# Patient Record
Sex: Female | Born: 1970 | Race: Black or African American | Hispanic: No | Marital: Single | Smoking: Current some day smoker
Health system: Southern US, Community
[De-identification: ages and names within clinical notes are randomized; demographics above are authoritative.]

## PROBLEM LIST (undated history)

## (undated) DIAGNOSIS — J45909 Unspecified asthma, uncomplicated: Secondary | ICD-10-CM

## (undated) DIAGNOSIS — F419 Anxiety disorder, unspecified: Secondary | ICD-10-CM

## (undated) DIAGNOSIS — E119 Type 2 diabetes mellitus without complications: Secondary | ICD-10-CM

## (undated) DIAGNOSIS — K5792 Diverticulitis of intestine, part unspecified, without perforation or abscess without bleeding: Secondary | ICD-10-CM

## (undated) DIAGNOSIS — E113499 Type 2 diabetes mellitus with severe nonproliferative diabetic retinopathy without macular edema, unspecified eye: Secondary | ICD-10-CM

## (undated) DIAGNOSIS — E1139 Type 2 diabetes mellitus with other diabetic ophthalmic complication: Secondary | ICD-10-CM

## (undated) DIAGNOSIS — K219 Gastro-esophageal reflux disease without esophagitis: Secondary | ICD-10-CM

## (undated) HISTORY — DX: Type 2 diabetes mellitus with other diabetic ophthalmic complication: E11.39

## (undated) HISTORY — DX: Diverticulitis of intestine, part unspecified, without perforation or abscess without bleeding: K57.92

## (undated) HISTORY — DX: Unspecified asthma, uncomplicated: J45.909

## (undated) HISTORY — DX: Type 2 diabetes mellitus without complications: E11.9

## (undated) HISTORY — DX: Gastro-esophageal reflux disease without esophagitis: K21.9

## (undated) HISTORY — DX: Type 2 diabetes mellitus with severe nonproliferative diabetic retinopathy without macular edema, unspecified eye: E11.3499

## (undated) HISTORY — PX: COLON SURGERY: SHX602

## (undated) HISTORY — PX: CHOLECYSTECTOMY: SHX55

## (undated) HISTORY — DX: Anxiety disorder, unspecified: F41.9

---

## 2014-08-26 DIAGNOSIS — K2270 Barrett's esophagus without dysplasia: Secondary | ICD-10-CM | POA: Insufficient documentation

## 2014-08-26 DIAGNOSIS — E1140 Type 2 diabetes mellitus with diabetic neuropathy, unspecified: Secondary | ICD-10-CM | POA: Insufficient documentation

## 2014-09-23 DIAGNOSIS — K5792 Diverticulitis of intestine, part unspecified, without perforation or abscess without bleeding: Secondary | ICD-10-CM | POA: Insufficient documentation

## 2014-10-28 DIAGNOSIS — J329 Chronic sinusitis, unspecified: Secondary | ICD-10-CM | POA: Insufficient documentation

## 2014-10-28 DIAGNOSIS — L02214 Cutaneous abscess of groin: Secondary | ICD-10-CM | POA: Insufficient documentation

## 2015-11-21 DIAGNOSIS — J309 Allergic rhinitis, unspecified: Secondary | ICD-10-CM | POA: Insufficient documentation

## 2016-05-20 DIAGNOSIS — S82892A Other fracture of left lower leg, initial encounter for closed fracture: Secondary | ICD-10-CM | POA: Insufficient documentation

## 2016-08-15 DIAGNOSIS — R059 Cough, unspecified: Secondary | ICD-10-CM | POA: Insufficient documentation

## 2019-03-22 ENCOUNTER — Ambulatory Visit: Payer: Self-pay | Admitting: Family Medicine

## 2019-04-01 ENCOUNTER — Encounter: Payer: Self-pay | Admitting: Nurse Practitioner

## 2019-04-01 ENCOUNTER — Ambulatory Visit: Payer: Medicaid - Out of State | Attending: Family Medicine | Admitting: Nurse Practitioner

## 2019-04-01 ENCOUNTER — Other Ambulatory Visit: Payer: Self-pay

## 2019-04-01 VITALS — Ht 66.0 in | Wt 150.0 lb

## 2019-04-01 DIAGNOSIS — Z794 Long term (current) use of insulin: Secondary | ICD-10-CM

## 2019-04-01 DIAGNOSIS — N761 Subacute and chronic vaginitis: Secondary | ICD-10-CM | POA: Diagnosis not present

## 2019-04-01 DIAGNOSIS — E1142 Type 2 diabetes mellitus with diabetic polyneuropathy: Secondary | ICD-10-CM

## 2019-04-01 DIAGNOSIS — E785 Hyperlipidemia, unspecified: Secondary | ICD-10-CM

## 2019-04-01 DIAGNOSIS — Z114 Encounter for screening for human immunodeficiency virus [HIV]: Secondary | ICD-10-CM

## 2019-04-01 DIAGNOSIS — F329 Major depressive disorder, single episode, unspecified: Secondary | ICD-10-CM

## 2019-04-01 DIAGNOSIS — F419 Anxiety disorder, unspecified: Secondary | ICD-10-CM | POA: Diagnosis not present

## 2019-04-01 DIAGNOSIS — Z130 Encounter for screening for diseases of the blood and blood-forming organs and certain disorders involving the immune mechanism: Secondary | ICD-10-CM

## 2019-04-01 MED ORDER — DULOXETINE HCL 30 MG PO CPEP: 30.0000 mg | ORAL_CAPSULE | Freq: Every day | ORAL | 1 refills | Status: DC

## 2019-04-01 MED ORDER — ATORVASTATIN CALCIUM 20 MG PO TABS: 20.0000 mg | ORAL_TABLET | Freq: Every day | ORAL | 3 refills | Status: DC

## 2019-04-01 MED ORDER — HYDROXYZINE HCL 10 MG PO TABS: 10.0000 mg | ORAL_TABLET | Freq: Three times a day (TID) | ORAL | 0 refills | Status: DC | PRN

## 2019-04-01 MED ORDER — GABAPENTIN 300 MG PO CAPS: 600.0000 mg | ORAL_CAPSULE | Freq: Three times a day (TID) | ORAL | 1 refills | Status: DC

## 2019-04-01 MED ORDER — FLUCONAZOLE 150 MG PO TABS: 150.0000 mg | ORAL_TABLET | Freq: Once | ORAL | 0 refills | Status: AC

## 2019-04-01 MED ORDER — TRUE METRIX METER W/DEVICE KIT: PACK | 0 refills | Status: DC

## 2019-04-01 MED ORDER — TRUEPLUS LANCETS 28G MISC: 3 refills | Status: DC

## 2019-04-01 MED ORDER — TRUE METRIX BLOOD GLUCOSE TEST VI STRP: ORAL_STRIP | 12 refills | Status: DC

## 2019-04-01 MED FILL — hydrOXYzine HCL 10 MG TABS: 10 | 10 days supply | Qty: 30 | Fill #0

## 2019-04-01 MED FILL — TRUEplus LANCETS 28G MISC: 50 days supply | Qty: 100 | Fill #0

## 2019-04-01 MED FILL — TRUE METRIX GLUCOSE TEST ST: 50 days supply | Qty: 100 | Fill #0

## 2019-04-01 MED FILL — ATORVASTATIN CALCIUM 20 MG: 20 | 30 days supply | Qty: 30 | Fill #0

## 2019-04-01 MED FILL — GABAPENTIN 300 MG CAPSULE: 300 | 30 days supply | Qty: 180 | Fill #0

## 2019-04-01 MED FILL — !TRUE METRIX BLOOD GLUCOSE: 1 days supply | Qty: 1 | Fill #0

## 2019-04-01 MED FILL — DULoxetine HCL 30 MG CPEP: 30 | 30 days supply | Qty: 30 | Fill #0

## 2019-04-01 MED FILL — FLUCONAZOLE 150 MG TABLET: 150 | 1 days supply | Qty: 1 | Fill #0

## 2019-04-01 NOTE — Progress Notes (Addendum)
Virtual Visit via Telephone Note Due to national recommendations of social distancing due to [place] 19, telehealth visit is felt to be most appropriate for this patient at this time.  I discussed the limitations, risks, security and privacy concerns of performing an evaluation and management service by telephone and the availability of in person appointments. I also discussed with the patient that there may be a patient responsible charge related to this service. The patient expressed understanding and agreed to proceed.    I connected with Denise Hale on 04/01/19  at  10:30 AM EDT  EDT by telephone and verified that I am speaking with the correct person using two identifiers.   Consent I discussed the limitations, risks, security and privacy concerns of performing an evaluation and management service by telephone and the availability of in person appointments. I also discussed with the patient that there may be a patient responsible charge related to this service. The patient expressed understanding and agreed to proceed.   [name]    Location of Provider: [place] and [place] participating in Telemedicine visit: [name] FNP-BC [facility] Maintenance Mammogram- Not sure 2019-2020 PAP-Not sure 2019-2020   History of Present Illness: Telemedicine visit for: [name] to [place] from August 2020 from [place], [place]. She last saw her PCP in [place] in October 2020.   DM TYPE 2 Diagnosed with DMII 12-13 years ago. She stopped taking her insulin last year.  States her numbers were good so she stopped taking her insulin every day and only as needed. It is unclear if this was actually recommended by her provider as I do not have records. She feels like she has a yeast infection today. Endorses chronic history of yeast infections for which her provider supplied her with prn diflucan. Blood  glucose levels have been in the 200s.  Current medications: Levemir 35 units BID and Novolog 70/30 SSI TID.  Hyperglycemic symptoms include peripheral neuropathy for which she was taking gabapentin 600 mg 3 times daily with minimal relief.  Based on PHQ9 Score and poor response to gabapentin for neuropathy we will start her on Cymbalta 30 mg daily  Anxiety and Depression Denies any current thoughts of self harm. Will start on cymbalta 30 mg and hydroxyzine 10 mg TID.  Depression screen PHQ 2/9 04/01/2019  Down, Depressed, Hopeless 3  PHQ - 2 Score 3  Altered sleeping 3  Tired, decreased energy 3  Change in appetite 3  Feeling bad or failure about yourself  3  Trouble concentrating 3  Moving slowly or fidgety/restless 0  Suicidal thoughts 0  PHQ-9 Score 18   GAD 7 : Generalized Anxiety Score 04/01/2019  Nervous, Anxious, on Edge 3  Control/stop worrying 3  Worry too much - different things 3  Trouble relaxing 3  Restless 2  Easily annoyed or irritable 2  Afraid - awful might happen 0  Total GAD 7 Score 16      Past Medical History:  Diagnosis Date  . Anxiety   . Asthma   . Diabetes mellitus without complication ([place])   . Diverticulitis   . GERD (gastroesophageal reflux disease)     Past Surgical History:  Procedure Laterality Date  . CHOLECYSTECTOMY    . COLON SURGERY      Family History  Problem Relation Age of Onset  . Diabetes Mother   . Thyroid disease Mother  Social History   Socioeconomic History  . Marital status: Unknown    Spouse name: Not on file  . Number of children: Not on file  . Years of education: Not on file  . Highest education level: Not on file  Occupational History  . Not on file  Tobacco Use  . Smoking status: Former Research scientist (life sciences)  . Smokeless tobacco: Never Used  Substance and Sexual Activity  . Alcohol use: Not Currently  . Drug use: Yes    Types: Marijuana  . Sexual activity: Yes  Other Topics Concern  . Not on file  Social History  Narrative  . Not on file   Social Determinants of Health   Financial Resource Strain:   . Difficulty of Paying Living Expenses:   Food Insecurity:   . Worried About Charity fundraiser in the Last Year:   . Arboriculturist in the Last Year:   Transportation Needs:   . Film/video editor (Medical):   Marland Kitchen Lack of Transportation (Non-Medical):   Physical Activity:   . Days of Exercise per Week:   . Minutes of Exercise per Session:   Stress:   . Feeling of Stress :   Social Connections:   . Frequency of Communication with Friends and Family:   . Frequency of Social Gatherings with Friends and Family:   . Attends Religious Services:   . Active Member of Clubs or Organizations:   . Attends Archivist Meetings:   Marland Kitchen Marital Status:      Observations/Objective: Awake, alert and oriented x 3   Review of Systems  Constitutional: Negative for fever, malaise/fatigue and weight loss.  HENT: Negative.  Negative for nosebleeds.   Eyes: Negative.  Negative for blurred vision, double vision and photophobia.  Respiratory: Negative.  Negative for cough and shortness of breath.   Cardiovascular: Negative.  Negative for chest pain, palpitations and leg swelling.  Gastrointestinal: Negative.  Negative for heartburn, nausea and vomiting.  Genitourinary:       Symptoms of vaginitis  Musculoskeletal: Negative.  Negative for myalgias.  Neurological: Positive for sensory change. Negative for dizziness, focal weakness, seizures and headaches.  Psychiatric/Behavioral: Positive for depression. Negative for suicidal ideas. The patient is nervous/anxious.     Assessment and Plan: Bora was seen today for new patient (initial visit).  Diagnoses and all orders for this visit:  Type 2 diabetes mellitus with diabetic polyneuropathy, with long-term current use of insulin (HCC) -     gabapentin (NEURONTIN) 300 MG capsule; Take 2 capsules (600 mg total) by mouth 3 (three) times daily. -      DULoxetine (CYMBALTA) 30 MG capsule; Take 1 capsule (30 mg total) by mouth daily. -     Hemoglobin A1c; Future -     CMP14+EGFR; Future -     Blood Glucose Monitoring Suppl (TRUE METRIX METER) w/Device KIT; Use as instructed. Check blood glucose level by fingerstick twice per day.   E11.65 -     TRUEplus Lancets 28G MISC; Use as instructed. Check blood glucose level by fingerstick twice per day.   E11.65 -     glucose blood (TRUE METRIX BLOOD GLUCOSE TEST) test strip; Use as instructed. Check blood glucose level by fingerstick twice per day.   E11.65 -     Microalbumin / creatinine urine ratio; Future Continue blood sugar control as discussed in office today, low carbohydrate diet, and regular physical exercise as tolerated, 150 minutes per week (30 min each day, 5  days per week, or 50 min 3 days per week). Keep blood sugar logs with fasting goal of 90-130 mg/dl, post prandial (after you eat) less than 180.  For Hypoglycemia: BS <60 and Hyperglycemia BS >400; contact the clinic ASAP. Annual eye exams and foot exams are recommended.   Dyslipidemia, goal LDL below 70 -     atorvastatin (LIPITOR) 20 MG tablet; Take 1 tablet (20 mg total) by mouth daily. -     Lipid panel; Future INSTRUCTIONS: Work on a low fat, heart healthy diet and participate in regular aerobic exercise program by working out at least 150 minutes per week; 5 days a week-30 minutes per day. Avoid red meat/beef/steak,  fried foods. junk foods, sodas, sugary drinks, unhealthy snacking, alcohol and smoking.  Drink at least 80 oz of water per day and monitor your carbohydrate intake daily.    Anxiety and depression -     DULoxetine (CYMBALTA) 30 MG capsule; Take 1 capsule (30 mg total) by mouth daily. -     hydrOXYzine (ATARAX/VISTARIL) 10 MG tablet; Take 1 tablet (10 mg total) by mouth 3 (three) times daily as needed. -     TSH; Future  Chronic vaginitis -     fluconazole (DIFLUCAN) 150 MG tablet; Take 1 tablet (150 mg total) by  mouth once for 1 dose. -     Cervicovaginal ancillary only; Future  Screening for deficiency anemia -     CBC; Future  Encounter for screening for HIV -     HIV antibody (with reflex); Future     Follow Up Instructions Return in about 4 weeks (around 04/29/2019).     I discussed the assessment and treatment plan with the patient. The patient was provided an opportunity to ask questions and all were answered. The patient agreed with the plan and demonstrated an understanding of the instructions.   The patient was advised to call back or seek an in-person evaluation if the symptoms worsen or if the condition fails to improve as anticipated.  I provided 20 minutes of non-face-to-face time during this encounter including median intraservice time, reviewing previous notes, labs, imaging, medications and explaining diagnosis and management.  Gildardo Pounds, FNP-BC

## 2019-04-02 ENCOUNTER — Other Ambulatory Visit: Payer: Self-pay

## 2019-04-02 ENCOUNTER — Ambulatory Visit: Payer: Medicaid - Out of State | Attending: Nurse Practitioner

## 2019-04-02 DIAGNOSIS — F32A Depression, unspecified: Secondary | ICD-10-CM

## 2019-04-02 DIAGNOSIS — E785 Hyperlipidemia, unspecified: Secondary | ICD-10-CM

## 2019-04-02 DIAGNOSIS — Z794 Long term (current) use of insulin: Secondary | ICD-10-CM

## 2019-04-02 DIAGNOSIS — E1142 Type 2 diabetes mellitus with diabetic polyneuropathy: Secondary | ICD-10-CM

## 2019-04-02 DIAGNOSIS — N761 Subacute and chronic vaginitis: Secondary | ICD-10-CM

## 2019-04-02 DIAGNOSIS — Z130 Encounter for screening for diseases of the blood and blood-forming organs and certain disorders involving the immune mechanism: Secondary | ICD-10-CM

## 2019-04-02 DIAGNOSIS — Z114 Encounter for screening for human immunodeficiency virus [HIV]: Secondary | ICD-10-CM

## 2019-04-02 DIAGNOSIS — F329 Major depressive disorder, single episode, unspecified: Secondary | ICD-10-CM

## 2019-04-03 LAB — CMP14+EGFR
ALT: 37 IU/L — ABNORMAL HIGH (ref 0–32)
AST: 30 IU/L (ref 0–40)
Albumin/Globulin Ratio: 1.4 (ref 1.2–2.2)
Albumin: 3.9 g/dL (ref 3.8–4.8)
Alkaline Phosphatase: 85 IU/L (ref 39–117)
BUN/Creatinine Ratio: 16 (ref 9–23)
BUN: 9 mg/dL (ref 6–24)
Bilirubin Total: <0.2 mg/dL (ref 0.0–1.2)
CO2: 27 mmol/L (ref 20–29)
Calcium: 9 mg/dL (ref 8.7–10.2)
Chloride: 103 mmol/L (ref 96–106)
Creatinine, Ser: 0.56 mg/dL — ABNORMAL LOW (ref 0.57–1.00)
GFR calc Af Amer: 127 mL/min/{1.73_m2} (ref >59)
GFR calc non Af Amer: 110 mL/min/{1.73_m2} (ref >59)
Globulin, Total: 2.7 g/dL (ref 1.5–4.5)
Glucose: 277 mg/dL — ABNORMAL HIGH (ref 65–99)
Potassium: 4.4 mmol/L (ref 3.5–5.2)
Sodium: 141 mmol/L (ref 134–144)
Total Protein: 6.6 g/dL (ref 6.0–8.5)

## 2019-04-03 LAB — CERVICOVAGINAL ANCILLARY ONLY
Bacterial Vaginitis (gardnerella): POSITIVE — AB
Candida Glabrata: NEGATIVE
Candida Vaginitis: POSITIVE — AB
Chlamydia: NEGATIVE
Comment: NEGATIVE
Comment: NEGATIVE
Comment: NEGATIVE
Comment: NEGATIVE
Comment: NEGATIVE
Comment: NORMAL
Neisseria Gonorrhea: NEGATIVE
Trichomonas: NEGATIVE

## 2019-04-03 LAB — HEMOGLOBIN A1C
Est. average glucose Bld gHb Est-mCnc: 321 mg/dL
Hgb A1c MFr Bld: 12.8 % — ABNORMAL HIGH (ref 4.8–5.6)

## 2019-04-03 LAB — CBC
Hematocrit: 38.7 % (ref 34.0–46.6)
Hemoglobin: 12.9 g/dL (ref 11.1–15.9)
MCH: 30.4 pg (ref 26.6–33.0)
MCHC: 33.3 g/dL (ref 31.5–35.7)
MCV: 91 fL (ref 79–97)
Platelets: 321 10*3/uL (ref 150–450)
RBC: 4.25 x10E6/uL (ref 3.77–5.28)
RDW: 12.2 % (ref 11.7–15.4)
WBC: 7 10*3/uL (ref 3.4–10.8)

## 2019-04-03 LAB — HIV ANTIBODY (ROUTINE TESTING W REFLEX): HIV Screen 4th Generation wRfx: NONREACTIVE

## 2019-04-03 LAB — LIPID PANEL
Chol/HDL Ratio: 1.9 ratio (ref 0.0–4.4)
Cholesterol, Total: 129 mg/dL (ref 100–199)
HDL: 67 mg/dL (ref >39)
LDL Chol Calc (NIH): 46 mg/dL (ref 0–99)
Triglycerides: 86 mg/dL (ref 0–149)
VLDL Cholesterol Cal: 16 mg/dL (ref 5–40)

## 2019-04-03 LAB — MICROALBUMIN / CREATININE URINE RATIO
Creatinine, Urine: 103.8 mg/dL
Microalb/Creat Ratio: 83 mg/g creat — ABNORMAL HIGH (ref 0–29)
Microalbumin, Urine: 86.6 ug/mL

## 2019-04-03 LAB — TSH: TSH: 1.47 u[IU]/mL (ref 0.450–4.500)

## 2019-04-04 ENCOUNTER — Other Ambulatory Visit: Payer: Self-pay | Admitting: Pharmacist

## 2019-04-04 MED ORDER — "INSULIN SYRINGE-NEEDLE U-100 30G X 5/16"" 0.5 ML MISC": 6 refills | Status: DC

## 2019-04-04 MED FILL — TRUEPLUS SYR 0.5ML 30GX5/16: 30G X 5/16" | 25 days supply | Qty: 100 | Fill #0

## 2019-04-15 ENCOUNTER — Other Ambulatory Visit: Payer: Self-pay | Admitting: Nurse Practitioner

## 2019-04-15 DIAGNOSIS — E1142 Type 2 diabetes mellitus with diabetic polyneuropathy: Secondary | ICD-10-CM

## 2019-04-15 MED ORDER — INSULIN DETEMIR 100 UNIT/ML FLEXPEN: 40.0000 [IU] | PEN_INJECTOR | Freq: Two times a day (BID) | SUBCUTANEOUS | 11 refills | Status: DC

## 2019-04-15 MED ORDER — METRONIDAZOLE 500 MG PO TABS: 500.0000 mg | ORAL_TABLET | Freq: Two times a day (BID) | ORAL | 0 refills | Status: AC

## 2019-04-15 MED ORDER — FLUCONAZOLE 150 MG PO TABS: 150.0000 mg | ORAL_TABLET | Freq: Once | ORAL | 0 refills | Status: AC

## 2019-04-15 MED ORDER — LISINOPRIL 5 MG PO TABS: 5.0000 mg | ORAL_TABLET | Freq: Every day | ORAL | 3 refills | Status: DC

## 2019-04-15 MED ORDER — NOVOLOG FLEXPEN 100 UNIT/ML ~~LOC~~ SOPN: 10.0000 [IU] | PEN_INJECTOR | Freq: Three times a day (TID) | SUBCUTANEOUS | 11 refills | Status: DC

## 2019-04-15 MED ORDER — BD PEN NEEDLE MINI U/F 31G X 5 MM MISC: 6 refills | Status: DC

## 2019-04-16 MED FILL — LISINOPRIL 5 MG TABLET: 5 | 30 days supply | Qty: 30 | Fill #0

## 2019-04-16 MED FILL — !LEVEMIR FLEXPEN 100UNITS/M: 100U/ML (3) | 18 days supply | Qty: 15 | Fill #0

## 2019-04-16 MED FILL — metroNIDAZOLE 500 MG TABS: 500 | 7 days supply | Qty: 14 | Fill #0

## 2019-04-16 MED FILL — HUMALOG 100 UNITS/ML KWIKPE: 100 | 30 days supply | Qty: 9 | Fill #0

## 2019-04-16 MED FILL — TRUEPLUS 5-BEVEL PEN NEEDLE: 31G X 5 MM | 25 days supply | Qty: 100 | Fill #0

## 2019-04-16 MED FILL — FLUCONAZOLE 150 MG TABLET: 150 | 1 days supply | Qty: 1 | Fill #0

## 2019-04-22 ENCOUNTER — Encounter: Payer: Self-pay | Admitting: Nurse Practitioner

## 2019-04-22 ENCOUNTER — Ambulatory Visit (HOSPITAL_BASED_OUTPATIENT_CLINIC_OR_DEPARTMENT_OTHER): Payer: Medicaid - Out of State | Admitting: Licensed Clinical Social Worker

## 2019-04-22 ENCOUNTER — Other Ambulatory Visit: Payer: Self-pay

## 2019-04-22 ENCOUNTER — Ambulatory Visit: Payer: Medicaid - Out of State | Attending: Nurse Practitioner | Admitting: Nurse Practitioner

## 2019-04-22 VITALS — BP 112/67 | HR 68 | Temp 97.7°F | Ht 65.0 in | Wt 146.0 lb

## 2019-04-22 DIAGNOSIS — F411 Generalized anxiety disorder: Secondary | ICD-10-CM

## 2019-04-22 DIAGNOSIS — F329 Major depressive disorder, single episode, unspecified: Secondary | ICD-10-CM

## 2019-04-22 DIAGNOSIS — Z1211 Encounter for screening for malignant neoplasm of colon: Secondary | ICD-10-CM

## 2019-04-22 DIAGNOSIS — Z794 Long term (current) use of insulin: Secondary | ICD-10-CM

## 2019-04-22 DIAGNOSIS — B351 Tinea unguium: Secondary | ICD-10-CM

## 2019-04-22 DIAGNOSIS — F419 Anxiety disorder, unspecified: Secondary | ICD-10-CM

## 2019-04-22 DIAGNOSIS — F331 Major depressive disorder, recurrent, moderate: Secondary | ICD-10-CM

## 2019-04-22 DIAGNOSIS — E785 Hyperlipidemia, unspecified: Secondary | ICD-10-CM

## 2019-04-22 DIAGNOSIS — E1142 Type 2 diabetes mellitus with diabetic polyneuropathy: Secondary | ICD-10-CM

## 2019-04-22 DIAGNOSIS — K2100 Gastro-esophageal reflux disease with esophagitis, without bleeding: Secondary | ICD-10-CM

## 2019-04-22 LAB — GLUCOSE, POCT (MANUAL RESULT ENTRY): POC Glucose: 286 mg/dl — AB (ref 70–99)

## 2019-04-22 MED ORDER — CICLOPIROX 8 % EX SOLN: Freq: Every day | CUTANEOUS | 0 refills | Status: DC

## 2019-04-22 MED ORDER — PANTOPRAZOLE SODIUM 40 MG PO TBEC: 40.0000 mg | DELAYED_RELEASE_TABLET | Freq: Every day | ORAL | 1 refills | Status: DC

## 2019-04-22 MED FILL — PANTOPRAZOLE SOD DR 40 MG T: 40 | 30 days supply | Qty: 30 | Fill #0

## 2019-04-22 MED FILL — CICLOPIROX 8% SOLUTION: 8 | 14 days supply | Qty: 7 | Fill #0

## 2019-04-22 NOTE — Patient Instructions (Signed)
For blood sugars 0-150 give 0 units of insulin, 151-200 give 2 units of insulin, 201-250 give 4 units, 251-300 give 6 units, 301-350 give 8 units, 351-400 give 10 units,> 400 give 12 units and call M.D.   

## 2019-04-22 NOTE — Progress Notes (Signed)
Assessment & Plan:  Denise Hale was seen today for follow-up.  Diagnoses and all orders for this visit:  Type 2 diabetes mellitus with diabetic polyneuropathy, with long-term current use of insulin (HCC) -     Glucose (CBG) Continue blood sugar control as discussed in office today, low carbohydrate diet, and regular physical exercise as tolerated, 150 minutes per week (30 min each day, 5 days per week, or 50 min 3 days per week). Keep blood sugar logs with fasting goal of 90-130 mg/dl, post prandial (after you eat) less than 180.  For Hypoglycemia: BS <60 and Hyperglycemia BS >400; contact the clinic ASAP. Annual eye exams and foot exams are recommended.   Dyslipidemia, goal LDL below 70 INSTRUCTIONS: Work on a low fat, heart healthy diet and participate in regular aerobic exercise program by working out at least 150 minutes per week; 5 days a week-30 minutes per day. Avoid red meat/beef/steak,  fried foods. junk foods, sodas, sugary drinks, unhealthy snacking, alcohol and smoking.  Drink at least 80 oz of water per day and monitor your carbohydrate intake daily.   Colon cancer screening -     Fecal occult blood, imunochemical([facility])  Nail fungus -     ciclopirox (PENLAC) 8 % solution; Apply topically at bedtime. Apply over nail and surrounding skin. Apply daily over previous coat. After seven (7) days, may remove with alcohol and continue cycle.  Gastroesophageal reflux disease with esophagitis without hemorrhage -     pantoprazole (PROTONIX) 40 MG tablet; Take 1 tablet (40 mg total) by mouth daily. INSTRUCTIONS: Avoid GERD Triggers: acidic, spicy or fried foods, caffeine, coffee, sodas,  alcohol and chocolate.    Depression and Anxiety Our LCSW was kind enough to evaluate and consult with Ms. Hangartner today here in the office.  Continue cymbalta and hydroxyzine as prescribed  Patient has been counseled on age-appropriate routine health concerns for screening and prevention.  These are reviewed and up-to-date. Referrals have been placed accordingly. Immunizations are up-to-date or declined.    Subjective:   Chief Complaint  Patient presents with  . Follow-up    Pt. is here for a follow up.    HPI Denise Hale 49 y.o. female presents to office today for follow up and meter check.  Very tearful today. Endorses increased stressors. Concerned about her physical health and her diabetes. Just doesn't feel right.   DM TYPE  She was recently started on Novolog 10 units TID and Levemir 40 units BID after A1C was found to be >12. She was also started on Cymbalta for neuropathy (gabapentin ineffective) and depression. Taking low dose STATIN and ACE. Her work schedule has been limiting her abilities to correctly administer her insulin. She works second shift. Sometimes gets home around 10-11 pm and doesn't fall asleep until at least 1 am. Unsure of when to administer her second dose of insulin. Her last meal is around 8-9 or sometimes as late midnight. Blood glucose levels have been as low as 40s. Feels her blood glucose levels were more stable with using SSI and levemir 35 units BID.  Lab Results  Component Value Date   HGBA1C 12.8 (H) 04/02/2019    Nail beds and bilateral toenails with fungus. 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   GERD She has a history of diverticulitis and IBS. S/P colon resection due to diveritculitis. Also states she has a history of Barrett's esophagus but no longer taking pantoprazole.    Depression and  Anxiety Currently taking Cymbalta 30 mg. PHQ9 has improved after being on cymbalta for a few weeks now. Endorses ruminating thoughts of not wanting to live at times. There is no plan to harm herself.  Depression screen [facility] 2/9 04/22/2019 04/01/2019  Decreased Interest 2 -  Down, Depressed, Hopeless 3 3  PHQ - 2 Score 5 3  Altered sleeping 2 3  Tired, decreased energy 1 3  Change in appetite 2 3  Feeling bad  or failure about yourself  1 3  Trouble concentrating 1 3  Moving slowly or fidgety/restless 2 0  Suicidal thoughts 1 0  PHQ-9 Score 15 18    Dyslipidemia LDL at goal of <70. Recently restarted on atorvastatin 20 mg.   Lab Results  Component Value Date   LDLCALC 46 04/02/2019    Review of Systems  Constitutional: Negative for fever, malaise/fatigue and weight loss.  HENT: Negative.  Negative for nosebleeds.   Eyes: Negative.  Negative for blurred vision, double vision and photophobia.  Respiratory: Negative.  Negative for cough and shortness of breath.   Cardiovascular: Negative.  Negative for chest pain, palpitations and leg swelling.  Gastrointestinal: Positive for heartburn. Negative for nausea and vomiting.  Musculoskeletal: Negative.  Negative for myalgias.  Skin:       SEE HPI  Neurological: Positive for sensory change (neuropathy). Negative for dizziness, focal weakness, seizures and headaches.  Psychiatric/Behavioral: Positive for depression. Negative for suicidal ideas. The patient is nervous/anxious.     Past Medical History:  Diagnosis Date  . Anxiety   . Asthma   . Diabetes mellitus without complication ([place])   . Diverticulitis   . GERD (gastroesophageal reflux disease)     Past Surgical History:  Procedure Laterality Date  . CHOLECYSTECTOMY    . COLON SURGERY      Family History  Problem Relation Age of Onset  . Diabetes Mother   . Thyroid disease Mother     Social History Reviewed with no changes to be made today.   Outpatient Medications Prior to Visit  Medication Sig Dispense Refill  . atorvastatin (LIPITOR) 20 MG tablet Take 1 tablet (20 mg total) by mouth daily. 90 tablet 3  . Blood Glucose Monitoring Suppl (TRUE METRIX METER) w/Device KIT Use as instructed. Check blood glucose level by fingerstick twice per day.   E11.65 1 kit 0  . DULoxetine (CYMBALTA) 30 MG capsule Take 1 capsule (30 mg total) by mouth daily. 90 capsule 1  . gabapentin  (NEURONTIN) 300 MG capsule Take 2 capsules (600 mg total) by mouth 3 (three) times daily. 180 capsule 1  . glucose blood (TRUE METRIX BLOOD GLUCOSE TEST) test strip Use as instructed. Check blood glucose level by fingerstick twice per day.   E11.65 100 each 12  . hydrOXYzine (ATARAX/VISTARIL) 10 MG tablet Take 1 tablet (10 mg total) by mouth 3 (three) times daily as needed. 30 tablet 0  . insulin aspart (NOVOLOG FLEXPEN) 100 UNIT/ML FlexPen Inject 10 Units into the skin 3 (three) times daily with meals. 15 mL 11  . insulin detemir (LEVEMIR) 100 UNIT/ML FlexPen Inject 40 Units into the skin 2 (two) times daily. 15 mL 11  . Insulin Pen Needle (B-D UF III MINI PEN NEEDLES) 31G X 5 MM MISC Use as instructed. Inject into the skin four times daily. E11.65 200 each 6  . lisinopril (ZESTRIL) 5 MG tablet Take 1 tablet (5 mg total) by mouth daily. 90 tablet 3  . metroNIDAZOLE (FLAGYL) 500 MG  tablet Take 1 tablet (500 mg total) by mouth 2 (two) times daily for 7 days. 14 tablet 0  . TRUEplus Lancets 28G MISC Use as instructed. Check blood glucose level by fingerstick twice per day.   E11.65 100 each 3   No facility-administered medications prior to visit.    Allergies  Allergen Reactions  . Ciprofloxacin        Objective:    BP 112/67 (BP Location: Left Arm, Patient Position: Sitting, Cuff Size: Normal)   Pulse 68   Temp 97.7 F (36.5 C) (Temporal)   Ht _0  (1.651 m)   Wt 146 lb (66.2 kg)   SpO2 99%   BMI 24.30 kg/m  Wt Readings from Last 3 Encounters:  04/22/19 146 lb (66.2 kg)  04/01/19 150 lb (68 kg)    Physical Exam Vitals and nursing note reviewed.  Constitutional:      Appearance: She is well-developed.  HENT:     Head: Normocephalic and atraumatic.  Cardiovascular:     Rate and Rhythm: Normal rate and regular rhythm.     Heart sounds: Normal heart sounds. No murmur. No friction rub. No gallop.   Pulmonary:     Effort: Pulmonary effort is normal. No tachypnea or respiratory  distress.     Breath sounds: Normal breath sounds. No decreased breath sounds, wheezing, rhonchi or rales.  Chest:     Chest wall: No tenderness.  Abdominal:     General: Bowel sounds are normal.     Palpations: Abdomen is soft.  Musculoskeletal:        General: Normal range of motion.     Cervical back: Normal range of motion.     Comments: Bilateral nail fungus/Discolored nails/Thin brittle  Feet:     Right foot:     Toenail Condition: Fungal disease present.    Left foot:     Toenail Condition: Fungal disease present. Skin:    General: Skin is warm and dry.  Neurological:     Mental Status: She is alert and oriented to person, place, and time.     Coordination: Coordination normal.  Psychiatric:        Mood and Affect: Mood is depressed. Affect is tearful.        Speech: Speech normal.        Behavior: Behavior normal. Behavior is cooperative.        Thought Content: Thought content normal.        Cognition and Memory: Cognition and memory normal.        Judgment: Judgment normal.          Patient has been counseled extensively about nutrition and exercise as well as the importance of adherence with medications and regular follow-up. The patient was given clear instructions to go to ER or return to medical center if symptoms don't improve, worsen or new problems develop. The patient verbalized understanding.   Follow-up: Return for Schedule PAP .   Gildardo Pounds, FNP-BC [facility] and [facility] [place], [place]   04/22/2019, 7:08 PM

## 2019-05-07 NOTE — BH Specialist Note (Signed)
Integrated Behavioral Health Initial Visit  MRN: [id number] Name: Denise Hale  Number of Integrated Behavioral Health Clinician visits:: 1/6 Session Start time: 11:25 AM  Session End time: 11:50 AM Total time: 25  Type of Service: Integrated Behavioral Health- Individual Interpretor:No. Interpretor Name and Language: NA   Warm Hand Off Completed.       SUBJECTIVE: Denise Hale is a 49 y.o. female accompanied by self Patient was referred by NP Meredeth Ide for depression and anxiety. Patient reports the following symptoms/concerns: Pt reports difficulty managing mental health symptoms triggered by psychosocial stressors. Pt reports stress from work and caring for grandchildren, strained relationship with partner, and grief (father 2 years ago) Duration of problem: Ongoing, Pt report diagnosis of depression and anxiety approx 15 years ago; Severity of problem: severe  OBJECTIVE: Mood: Anxious and Affect: Depressed Risk of harm to self or others: No plan to harm self or others Pt scored positive on phq9; however, denies current SI/HI. Protective factors identified and crisis intervention resources provided  LIFE CONTEXT: Family and Social: Pt resides with partner School/Work: Pt is employed full time Self-Care: Pt is participating in medication management through PCP. States she is interested in therapy Life Changes: Pt reports difficulty managing psychosocial stressors  GOALS ADDRESSED: Patient will: 1. Reduce symptoms of: agitation, anxiety, depression and stress 2. Increase knowledge and/or ability of: coping skills and healthy habits  3. Demonstrate ability to: Increase healthy adjustment to current life circumstances, Increase adequate support systems for patient/family and Begin healthy grieving over loss  INTERVENTIONS: Interventions utilized: Solution-Focused Strategies, Supportive Counseling, Psychoeducation and/or Health Education and Link to [facility]   Standardized Assessments completed: GAD-7 and PHQ 2&9 with C-SSRS  ASSESSMENT: Patient currently experiencing depression and anxiety symptoms triggered by stress and grief. Pt scored positive on phq9; however, denies current SI/HI. Protective factors identified and crisis intervention resources provided.   Patient may benefit from therapy and continued medication management. Healthy coping skills were discussed, in addition, to agencies to establish behavioral health services. LCSW provided resources to assist patient with obtaining independent housing.   PLAN: 1. Follow up with behavioral health clinician on : Contact LCSW with additional behavioral health and/or resource needs 2. Behavioral recommendations: Utilize strategies and resources provided. Continue with medication management 3. Referral(s): Integrated Art gallery manager (In Clinic), Community Mental Health Services (LME/Outside Clinic) and [employer]:  Housing 4. "From scale of 1-10, how likely are you to follow plan?":   Bridgett Larsson, LCSW 05/07/2019 3:40 PM

## 2019-05-17 ENCOUNTER — Other Ambulatory Visit: Payer: Self-pay

## 2019-05-17 DIAGNOSIS — Z1231 Encounter for screening mammogram for malignant neoplasm of breast: Secondary | ICD-10-CM

## 2019-05-20 ENCOUNTER — Other Ambulatory Visit: Payer: Self-pay | Admitting: Obstetrics and Gynecology

## 2019-05-20 DIAGNOSIS — Z1231 Encounter for screening mammogram for malignant neoplasm of breast: Secondary | ICD-10-CM

## 2019-05-29 ENCOUNTER — Other Ambulatory Visit: Payer: Self-pay | Admitting: Nurse Practitioner

## 2019-05-29 DIAGNOSIS — F419 Anxiety disorder, unspecified: Secondary | ICD-10-CM

## 2019-05-29 MED FILL — GABAPENTIN 300 MG CAPSULE: 300 | 30 days supply | Qty: 180 | Fill #1

## 2019-05-29 MED FILL — DULoxetine HCL 30 MG CPEP: 30 | 30 days supply | Qty: 30 | Fill #1

## 2019-05-29 MED FILL — ATORVASTATIN CALCIUM 20 MG: 20 | 30 days supply | Qty: 30 | Fill #1

## 2019-05-29 MED FILL — LISINOPRIL 5 MG TABLET: 5 | 30 days supply | Qty: 30 | Fill #1

## 2019-05-29 MED FILL — PANTOPRAZOLE SOD DR 40 MG T: 40 | 30 days supply | Qty: 30 | Fill #1

## 2019-05-30 MED FILL — hydrOXYzine HCL 10 MG TABS: 10 | 10 days supply | Qty: 30 | Fill #0

## 2019-05-31 MED FILL — TRUE METRIX GLUCOSE TEST ST: 50 days supply | Qty: 100 | Fill #1

## 2019-06-04 ENCOUNTER — Ambulatory Visit: Payer: Medicaid - Out of State | Attending: Nurse Practitioner | Admitting: Nurse Practitioner

## 2019-06-04 ENCOUNTER — Other Ambulatory Visit: Payer: Self-pay

## 2019-06-04 ENCOUNTER — Encounter: Payer: Self-pay | Admitting: Nurse Practitioner

## 2019-06-04 VITALS — BP 117/74 | HR 66 | Temp 97.0°F | Ht 65.0 in | Wt 143.0 lb

## 2019-06-04 DIAGNOSIS — Z794 Long term (current) use of insulin: Secondary | ICD-10-CM

## 2019-06-04 DIAGNOSIS — E1142 Type 2 diabetes mellitus with diabetic polyneuropathy: Secondary | ICD-10-CM

## 2019-06-04 DIAGNOSIS — Z124 Encounter for screening for malignant neoplasm of cervix: Secondary | ICD-10-CM

## 2019-06-04 LAB — GLUCOSE, POCT (MANUAL RESULT ENTRY): POC Glucose: 246 mg/dl — AB (ref 70–99)

## 2019-06-04 MED ORDER — GABAPENTIN 300 MG PO CAPS: 600.0000 mg | ORAL_CAPSULE | Freq: Three times a day (TID) | ORAL | 1 refills | Status: DC

## 2019-06-04 MED ORDER — INSULIN DETEMIR 100 UNIT/ML FLEXPEN: 30.0000 [IU] | PEN_INJECTOR | Freq: Two times a day (BID) | SUBCUTANEOUS | 6 refills | Status: DC

## 2019-06-04 MED FILL — !LEVEMIR FLEXPEN 100UNITS/M: 100U/ML (3) | 30 days supply | Qty: 18 | Fill #0

## 2019-06-04 NOTE — Progress Notes (Signed)
Assessment & Plan:  Denise Hale was seen today for gynecologic exam.  Diagnoses and all orders for this visit:  Encounter for Papanicolaou smear for cervical cancer screening -     Cytology - PAP -     Cervicovaginal ancillary only  Type 2 diabetes mellitus with diabetic polyneuropathy, with long-term current use of insulin (HCC) -     Glucose (CBG) -     gabapentin (NEURONTIN) 300 MG capsule; Take 2 capsules (600 mg total) by mouth 3 (three) times daily. -     insulin detemir (LEVEMIR) 100 UNIT/ML FlexPen; Inject 30 Units into the skin 2 (two) times daily. Endorses symptoms of nausea, vomiting and chlls overnight after administering levemir 35 units. Will decrease to 30 units. She was also instructed not to give herself levemir if she has not eaten a full meal.   Patient has been counseled on age-appropriate routine health concerns for screening and prevention. These are reviewed and up-to-date. Referrals have been placed accordingly. Immunizations are up-to-date or declined.    Subjective:   Chief Complaint  Patient presents with  . Gynecologic Exam    Pt. is here for a pap smear.    HPI Denise Hale 49 y.o. female presents to office today   Review of Systems  Constitutional: Negative.  Negative for chills, fever, malaise/fatigue and weight loss.  Respiratory: Negative.  Negative for cough, shortness of breath and wheezing.   Cardiovascular: Negative.  Negative for chest pain, orthopnea and leg swelling.  Gastrointestinal: Negative for abdominal pain.  Genitourinary: Negative.  Negative for flank pain.  Skin: Negative.  Negative for rash.  Psychiatric/Behavioral: Negative for suicidal ideas.    Past Medical History:  Diagnosis Date  . Anxiety   . Asthma   . Diabetes mellitus without complication ([place])   . Diverticulitis   . GERD (gastroesophageal reflux disease)     Past Surgical History:  Procedure Laterality Date  . CHOLECYSTECTOMY    . COLON SURGERY      Family  History  Problem Relation Age of Onset  . Diabetes Mother   . Thyroid disease Mother     Social History Reviewed with no changes to be made today.   Outpatient Medications Prior to Visit  Medication Sig Dispense Refill  . atorvastatin (LIPITOR) 20 MG tablet Take 1 tablet (20 mg total) by mouth daily. 90 tablet 3  . Blood Glucose Monitoring Suppl (TRUE METRIX METER) w/Device KIT Use as instructed. Check blood glucose level by fingerstick twice per day.   E11.65 1 kit 0  . ciclopirox (PENLAC) 8 % solution Apply topically at bedtime. Apply over nail and surrounding skin. Apply daily over previous coat. After seven (7) days, may remove with alcohol and continue cycle. 6.6 mL 0  . DULoxetine (CYMBALTA) 30 MG capsule Take 1 capsule (30 mg total) by mouth daily. 90 capsule 1  . glucose blood (TRUE METRIX BLOOD GLUCOSE TEST) test strip Use as instructed. Check blood glucose level by fingerstick twice per day.   E11.65 100 each 12  . hydrOXYzine (ATARAX/VISTARIL) 10 MG tablet TAKE 1 TABLET (10 MG TOTAL) BY MOUTH 3 (THREE) TIMES DAILY AS NEEDED. 30 tablet 0  . insulin aspart (NOVOLOG FLEXPEN) 100 UNIT/ML FlexPen Inject 10 Units into the skin 3 (three) times daily with meals. 15 mL 11  . Insulin Pen Needle (B-D UF III MINI PEN NEEDLES) 31G X 5 MM MISC Use as instructed. Inject into the skin four times daily. E11.65 200 each 6  .  lisinopril (ZESTRIL) 5 MG tablet Take 1 tablet (5 mg total) by mouth daily. 90 tablet 3  . pantoprazole (PROTONIX) 40 MG tablet Take 1 tablet (40 mg total) by mouth daily. 90 tablet 1  . TRUEplus Lancets 28G MISC Use as instructed. Check blood glucose level by fingerstick twice per day.   E11.65 100 each 3  . gabapentin (NEURONTIN) 300 MG capsule Take 2 capsules (600 mg total) by mouth 3 (three) times daily. 180 capsule 1  . insulin detemir (LEVEMIR) 100 UNIT/ML FlexPen Inject 40 Units into the skin 2 (two) times daily. 15 mL 11   No facility-administered medications prior to  visit.    Allergies  Allergen Reactions  . Ciprofloxacin        Objective:    BP 117/74 (BP Location: Left Arm, Patient Position: Sitting, Cuff Size: Normal)   Pulse 66   Temp (!) 97 F (36.1 C) (Temporal)   Ht '5\' 5"'$  (1.651 m)   Wt 143 lb (64.9 kg)   SpO2 99%   BMI 23.80 kg/m  Wt Readings from Last 3 Encounters:  06/04/19 143 lb (64.9 kg)  04/22/19 146 lb (66.2 kg)  04/01/19 150 lb (68 kg)    Physical Exam Exam conducted with a chaperone present.  Constitutional:      Appearance: She is well-developed.  HENT:     Head: Normocephalic.  Cardiovascular:     Rate and Rhythm: Normal rate and regular rhythm.     Heart sounds: Normal heart sounds.  Pulmonary:     Effort: Pulmonary effort is normal.     Breath sounds: Normal breath sounds.  Abdominal:     General: Bowel sounds are normal.     Palpations: Abdomen is soft.     Hernia: There is no hernia in the left inguinal area.  Genitourinary:    Exam position: Lithotomy position.     Labia:        Right: No rash, tenderness, lesion or injury.        Left: No rash, tenderness, lesion or injury.      Vagina: Normal. No signs of injury and foreign body. No vaginal discharge, erythema, tenderness or bleeding.     Cervix: No cervical motion tenderness or friability.     Uterus: Not deviated and not enlarged.      Adnexa:        Right: No mass, tenderness or fullness.         Left: No mass, tenderness or fullness.       Rectum: Normal. No external hemorrhoid.  Lymphadenopathy:     Lower Body: No right inguinal adenopathy. No left inguinal adenopathy.  Skin:    General: Skin is warm and dry.  Neurological:     Mental Status: She is alert and oriented to person, place, and time.  Psychiatric:        Behavior: Behavior normal.        Thought Content: Thought content normal.        Judgment: Judgment normal.          Patient has been counseled extensively about nutrition and exercise as well as the importance of  adherence with medications and regular follow-up. The patient was given clear instructions to go to ER or return to medical center if symptoms don't improve, worsen or new problems develop. The patient verbalized understanding.   Follow-up: Return if symptoms worsen or fail to improve.   Gildardo Pounds, FNP-BC [facility] and  [place], [place]   06/04/2019, 10:51 AM

## 2019-06-05 ENCOUNTER — Other Ambulatory Visit: Payer: Self-pay | Admitting: Nurse Practitioner

## 2019-06-05 LAB — CERVICOVAGINAL ANCILLARY ONLY
Bacterial Vaginitis (gardnerella): POSITIVE — AB
Candida Glabrata: NEGATIVE
Candida Vaginitis: NEGATIVE
Chlamydia: NEGATIVE
Comment: NEGATIVE
Comment: NEGATIVE
Comment: NEGATIVE
Comment: NEGATIVE
Comment: NEGATIVE
Comment: NORMAL
Neisseria Gonorrhea: NEGATIVE
Trichomonas: NEGATIVE

## 2019-06-05 MED ORDER — METRONIDAZOLE 500 MG PO TABS: 500.0000 mg | ORAL_TABLET | Freq: Two times a day (BID) | ORAL | 0 refills | Status: AC

## 2019-06-06 ENCOUNTER — Inpatient Hospital Stay: Admission: RE | Admit: 2019-06-06 | Payer: Medicaid - Out of State | Source: Ambulatory Visit

## 2019-06-06 ENCOUNTER — Ambulatory Visit: Payer: Self-pay

## 2019-06-06 LAB — CYTOLOGY - PAP
Comment: NEGATIVE
Diagnosis: NEGATIVE
High risk HPV: NEGATIVE

## 2019-06-06 MED FILL — metroNIDAZOLE 500 MG TABS: 500 | 7 days supply | Qty: 14 | Fill #0

## 2019-06-21 ENCOUNTER — Ambulatory Visit: Payer: Medicaid - Out of State | Admitting: Internal Medicine

## 2019-06-27 ENCOUNTER — Ambulatory Visit: Payer: Self-pay

## 2019-07-02 ENCOUNTER — Other Ambulatory Visit: Payer: Self-pay | Admitting: Nurse Practitioner

## 2019-07-02 ENCOUNTER — Ambulatory Visit: Payer: Medicaid - Out of State | Attending: Nurse Practitioner | Admitting: Nurse Practitioner

## 2019-07-02 ENCOUNTER — Encounter: Payer: Self-pay | Admitting: Nurse Practitioner

## 2019-07-02 ENCOUNTER — Other Ambulatory Visit: Payer: Self-pay

## 2019-07-02 VITALS — BP 115/77 | HR 80 | Temp 97.7°F | Ht 65.0 in | Wt 145.0 lb

## 2019-07-02 DIAGNOSIS — K5909 Other constipation: Secondary | ICD-10-CM

## 2019-07-02 DIAGNOSIS — Z794 Long term (current) use of insulin: Secondary | ICD-10-CM

## 2019-07-02 DIAGNOSIS — F419 Anxiety disorder, unspecified: Secondary | ICD-10-CM

## 2019-07-02 DIAGNOSIS — F329 Major depressive disorder, single episode, unspecified: Secondary | ICD-10-CM

## 2019-07-02 DIAGNOSIS — E1142 Type 2 diabetes mellitus with diabetic polyneuropathy: Secondary | ICD-10-CM

## 2019-07-02 LAB — POCT GLYCOSYLATED HEMOGLOBIN (HGB A1C): Hemoglobin A1C: 10 % — AB (ref 4.0–5.6)

## 2019-07-02 LAB — GLUCOSE, POCT (MANUAL RESULT ENTRY): POC Glucose: 303 mg/dl — AB (ref 70–99)

## 2019-07-02 MED ORDER — HYDROXYZINE HCL 10 MG PO TABS: 10.0000 mg | ORAL_TABLET | Freq: Three times a day (TID) | ORAL | 2 refills | Status: DC | PRN

## 2019-07-02 MED ORDER — DULOXETINE HCL 30 MG PO CPEP: 30.0000 mg | ORAL_CAPSULE | Freq: Every day | ORAL | 1 refills | Status: DC

## 2019-07-02 MED ORDER — LINACLOTIDE 145 MCG PO CAPS: 145.0000 ug | ORAL_CAPSULE | Freq: Every day | ORAL | 3 refills | Status: DC

## 2019-07-02 MED ORDER — NOVOLOG FLEXPEN 100 UNIT/ML ~~LOC~~ SOPN: PEN_INJECTOR | SUBCUTANEOUS | 11 refills | Status: DC

## 2019-07-02 MED FILL — LINZESS 145 MCG CAPSULE: 145 | 17 days supply | Qty: 30 | Fill #0

## 2019-07-02 MED FILL — hydrOXYzine HCL 10 MG TABS: 10 | 30 days supply | Qty: 90 | Fill #0

## 2019-07-02 MED FILL — DULoxetine HCL 30 MG CPEP: 30 | 30 days supply | Qty: 30 | Fill #0

## 2019-07-03 LAB — CMP14+EGFR
ALT: 48 IU/L — ABNORMAL HIGH (ref 0–32)
AST: 24 IU/L (ref 0–40)
Albumin/Globulin Ratio: 1.8 (ref 1.2–2.2)
Albumin: 4.8 g/dL (ref 3.8–4.8)
Alkaline Phosphatase: 116 IU/L (ref 48–121)
BUN/Creatinine Ratio: 16 (ref 9–23)
BUN: 10 mg/dL (ref 6–24)
Bilirubin Total: 0.2 mg/dL (ref 0.0–1.2)
CO2: 28 mmol/L (ref 20–29)
Calcium: 9.6 mg/dL (ref 8.7–10.2)
Chloride: 97 mmol/L (ref 96–106)
Creatinine, Ser: 0.63 mg/dL (ref 0.57–1.00)
GFR calc Af Amer: 122 mL/min/{1.73_m2} (ref >59)
GFR calc non Af Amer: 106 mL/min/{1.73_m2} (ref >59)
Globulin, Total: 2.7 g/dL (ref 1.5–4.5)
Glucose: 274 mg/dL — ABNORMAL HIGH (ref 65–99)
Potassium: 4.5 mmol/L (ref 3.5–5.2)
Sodium: 139 mmol/L (ref 134–144)
Total Protein: 7.5 g/dL (ref 6.0–8.5)

## 2019-07-04 ENCOUNTER — Encounter: Payer: Self-pay | Admitting: Nurse Practitioner

## 2019-07-04 MED ORDER — PAROXETINE HCL 10 MG PO TABS
10.0000 mg | ORAL_TABLET | Freq: Every day | ORAL | 1 refills | Status: DC
Start: 2019-07-04 — End: 2019-08-06

## 2019-07-04 MED FILL — PAROXETINE HCL 10 MG TABS: 10 | 30 days supply | Qty: 30 | Fill #0

## 2019-07-04 NOTE — Progress Notes (Signed)
Spoke to patient and informed on medication. Pt. Understood.

## 2019-07-04 NOTE — Progress Notes (Signed)
Assessment & Plan:  Denise Hale was seen today for follow-up.  Diagnoses and all orders for this visit:  Type 2 diabetes mellitus with diabetic polyneuropathy, with long-term current use of insulin (HCC) -     Glucose (CBG) -     HgB A1c -     Ambulatory referral to Podiatry -     DULoxetine (CYMBALTA) 30 MG capsule; Take 1 capsule (30 mg total) by mouth daily. -     Ambulatory referral to Ophthalmology -     insulin aspart (NOVOLOG FLEXPEN) 100 UNIT/ML FlexPen; For blood sugars 0-150 give 0 units of insulin, 151-200 give 2 units of insulin, 201-250 give 4 units, 251-300 give 6 units, 301-350 give 8 units, 351-400 give 10 units,> 400 give 12 units and call M.D. Discussed hypoglycemia protocol. -     CMP14+EGFR Continue blood sugar control as discussed in office today, low carbohydrate diet, and regular physical exercise as tolerated, 150 minutes per week (30 min each day, 5 days per week, or 50 min 3 days per week). Keep blood sugar logs with fasting goal of 90-130 mg/dl, post prandial (after you eat) less than 180.  For Hypoglycemia: BS <60 and Hyperglycemia BS >400; contact the clinic ASAP. Annual eye exams and foot exams are recommended.   Anxiety and depression -     DULoxetine (CYMBALTA) 30 MG capsule; Take 1 capsule (30 mg total) by mouth daily. -     hydrOXYzine (ATARAX/VISTARIL) 10 MG tablet; Take 1 tablet (10 mg total) by mouth 3 (three) times daily as needed.  Chronic constipation -     linaclotide (LINZESS) 145 MCG CAPS capsule; Take 1 capsule (145 mcg total) by mouth daily before breakfast. May increase to 2 capsules after 5 days if no improvement Increase water intake to at least 80 oz per day    Patient has been counseled on age-appropriate routine health concerns for screening and prevention. These are reviewed and up-to-date. Referrals have been placed accordingly. Immunizations are up-to-date or declined.    Subjective:   Chief Complaint  Patient presents with  .  Follow-up    Pt. is here for 3 months F.U on diabetes.    HPI Cyla Yerkes 49 y.o. female presents to office today for follow up.  has a past medical history of Anxiety, Asthma, Diabetes mellitus without complication ([place]), Diverticulitis, and GERD (gastroesophageal reflux disease).   DM TYPE 2 Poorly controlled. Will switch her Novolog to SSI. She will continue on Levemir 30 units BID. I have also referred her to Endocrinology. Main issue is labile readings due to poor eating habits and missing meals. She takes gabapentin and cymbalta for peripheral neuropathy. On renal dose ACE and atorvastatin 20 mg daily. Overdue for eye exam. Lab Results  Component Value Date   HGBA1C 10.0 (A) 07/02/2019   Lab Results  Component Value Date   LDLCALC 46 04/02/2019   Chronic Constipation Bowel movements occurring 1-2 times per week. Passing hard stools. Has not had a bowel movement in several days. Endorses abdominal pain. Denies nausea, vomiting, melena or hematochezia.   Review of Systems  Constitutional: Negative for fever, malaise/fatigue and weight loss.       Hot flashes  HENT: Negative.  Negative for nosebleeds.   Eyes: Negative.  Negative for blurred vision, double vision and photophobia.  Respiratory: Negative.  Negative for cough and shortness of breath.   Cardiovascular: Negative.  Negative for chest pain, palpitations and leg swelling.  Gastrointestinal: Positive for constipation and  heartburn. Negative for nausea and vomiting.  Musculoskeletal: Negative.  Negative for myalgias.  Neurological: Positive for tingling and sensory change. Negative for dizziness, focal weakness, seizures and headaches.  Psychiatric/Behavioral: Positive for depression. Negative for suicidal ideas. The patient is nervous/anxious.     Past Medical History:  Diagnosis Date  . Anxiety   . Asthma   . Diabetes mellitus without complication ([place])   . Diverticulitis   . GERD (gastroesophageal reflux disease)       Past Surgical History:  Procedure Laterality Date  . CHOLECYSTECTOMY    . COLON SURGERY      Family History  Problem Relation Age of Onset  . Diabetes Mother   . Thyroid disease Mother     Social History Reviewed with no changes to be made today.   Outpatient Medications Prior to Visit  Medication Sig Dispense Refill  . atorvastatin (LIPITOR) 20 MG tablet Take 1 tablet (20 mg total) by mouth daily. 90 tablet 3  . Blood Glucose Monitoring Suppl (TRUE METRIX METER) w/Device KIT Use as instructed. Check blood glucose level by fingerstick twice per day.   E11.65 1 kit 0  . ciclopirox (PENLAC) 8 % solution Apply topically at bedtime. Apply over nail and surrounding skin. Apply daily over previous coat. After seven (7) days, may remove with alcohol and continue cycle. 6.6 mL 0  . gabapentin (NEURONTIN) 300 MG capsule Take 2 capsules (600 mg total) by mouth 3 (three) times daily. 180 capsule 1  . glucose blood (TRUE METRIX BLOOD GLUCOSE TEST) test strip Use as instructed. Check blood glucose level by fingerstick twice per day.   E11.65 100 each 12  . insulin detemir (LEVEMIR) 100 UNIT/ML FlexPen Inject 30 Units into the skin 2 (two) times daily. 18 mL 6  . Insulin Pen Needle (B-D UF III MINI PEN NEEDLES) 31G X 5 MM MISC Use as instructed. Inject into the skin four times daily. E11.65 200 each 6  . lisinopril (ZESTRIL) 5 MG tablet Take 1 tablet (5 mg total) by mouth daily. 90 tablet 3  . pantoprazole (PROTONIX) 40 MG tablet Take 1 tablet (40 mg total) by mouth daily. 90 tablet 1  . TRUEplus Lancets 28G MISC Use as instructed. Check blood glucose level by fingerstick twice per day.   E11.65 100 each 3  . hydrOXYzine (ATARAX/VISTARIL) 10 MG tablet TAKE 1 TABLET (10 MG TOTAL) BY MOUTH 3 (THREE) TIMES DAILY AS NEEDED. 30 tablet 0  . insulin aspart (NOVOLOG FLEXPEN) 100 UNIT/ML FlexPen Inject 10 Units into the skin 3 (three) times daily with meals. 15 mL 11  . DULoxetine (CYMBALTA) 30 MG capsule  Take 1 capsule (30 mg total) by mouth daily. 90 capsule 1   No facility-administered medications prior to visit.    Allergies  Allergen Reactions  . Ciprofloxacin        Objective:    BP 115/77 (BP Location: Left Arm, Patient Position: Sitting, Cuff Size: Normal)   Pulse 80   Temp 97.7 F (36.5 C) (Temporal)   Ht _0  (1.651 m)   Wt 145 lb (65.8 kg)   SpO2 96%   BMI 24.13 kg/m  Wt Readings from Last 3 Encounters:  07/02/19 145 lb (65.8 kg)  06/04/19 143 lb (64.9 kg)  04/22/19 146 lb (66.2 kg)    Physical Exam Vitals and nursing note reviewed.  Constitutional:      Appearance: She is well-developed.  HENT:     Head: Normocephalic and atraumatic.  Cardiovascular:  Rate and Rhythm: Normal rate and regular rhythm.     Heart sounds: Normal heart sounds. No murmur heard.  No friction rub. No gallop.   Pulmonary:     Effort: Pulmonary effort is normal. No tachypnea or respiratory distress.     Breath sounds: Normal breath sounds. No decreased breath sounds, wheezing, rhonchi or rales.  Chest:     Chest wall: No tenderness.  Abdominal:     General: Bowel sounds are normal.     Palpations: Abdomen is soft.  Musculoskeletal:        General: Normal range of motion.     Cervical back: Normal range of motion.  Feet:     Right foot:     Toenail Condition: Right toenails are abnormally thick. Fungal disease present.    Left foot:     Toenail Condition: Left toenails are abnormally thick. Fungal disease present. Skin:    General: Skin is warm and dry.  Neurological:     Mental Status: She is alert and oriented to person, place, and time.     Coordination: Coordination normal.  Psychiatric:        Behavior: Behavior normal. Behavior is cooperative.        Thought Content: Thought content normal.        Judgment: Judgment normal.          Patient has been counseled extensively about nutrition and exercise as well as the importance of adherence with medications  and regular follow-up. The patient was given clear instructions to go to ER or return to medical center if symptoms don't improve, worsen or new problems develop. The patient verbalized understanding.   Follow-up: Return for f/u 3 weeks constipation; hot flashes, depression.   Gildardo Pounds, FNP-BC [facility] and [facility] [place], [place]   07/04/2019, 9:05 AM

## 2019-07-09 ENCOUNTER — Other Ambulatory Visit: Payer: Self-pay

## 2019-07-09 ENCOUNTER — Ambulatory Visit: Payer: Self-pay | Admitting: *Deleted

## 2019-07-09 ENCOUNTER — Ambulatory Visit
Admission: RE | Admit: 2019-07-09 | Discharge: 2019-07-09 | Disposition: A | Discharge status: Home / Self Care | Payer: Medicaid - Out of State | Source: Ambulatory Visit | Attending: Obstetrics and Gynecology | Admitting: Obstetrics and Gynecology

## 2019-07-09 VITALS — BP 119/74 | Temp 97.3°F | Wt 140.8 lb

## 2019-07-09 DIAGNOSIS — Z1231 Encounter for screening mammogram for malignant neoplasm of breast: Secondary | ICD-10-CM

## 2019-07-09 DIAGNOSIS — Z1239 Encounter for other screening for malignant neoplasm of breast: Secondary | ICD-10-CM

## 2019-07-09 NOTE — Progress Notes (Signed)
[name] is a 49 y.o. female who presents to [facility] clinic today with no complaints.    Pap Smear: Pap smear not completed today. Last Pap smear was 06/04/2019 at [facility] clinic and was normal with negative HPV. Per patient has history of an abnormal Pap smear 15 years ago. A repeat Pap smear was done 1 year after this abnormal one and patient has had at least 3 normal Pap smears since then. Last Pap smear result is available in Epic.   Physical exam: Breasts Breasts symmetrical. No skin abnormalities bilateral breasts. No nipple retraction bilateral breasts. No nipple discharge bilateral breasts. No lymphadenopathy. No lumps palpated bilateral breasts.       Pelvic/Bimanual Pap is not indicated today.    Smoking History: Patient is a former smoker.    Patient Navigation: Patient education provided. Access to services provided for patient through [facility] program.   Colorectal Cancer Screening: Per patient has had a colonoscopy on 04/24/2019 and it was negative. No complaints today.    Breast and Cervical Cancer Risk Assessment: Patient has a family history of breast cancer in two paternal aunts. No known genetic mutations, or radiation treatment to the chest before age 65. Patient does not have history of cervical dysplasia, immunocompromised, or DES exposure in-utero.  Risk Assessment    Risk Scores      07/09/2019   Last edited by: Narda Rutherford, LPN   5-year risk: 1.1 %   Lifetime risk: 8.8 %          A: [facility] exam without pap smear No complaints today.  P: Referred patient to the [facility] for a screening mammogram. Appointment scheduled 07/09/2019 at 9:50am on the mobile unit.  Mila Homer, RN, FNP student 07/09/2019 10:29 AM   Attestation of Supervision of Student:  I confirm that I have verified the information documented in the nurse practitioner student's note and that I have also personally reperformed the history, physical exam and all  medical decision making activities.  I have verified that all services and findings are accurately documented in this student's note; and I agree with management and plan as outlined in the documentation. I have also made any necessary editorial changes.  No complaints of pain or tenderness on exam.  [place], Kathaleen Maser, RN [facility] for [employer], [facility] 07/09/2019 12:36 PM

## 2019-07-09 NOTE — Patient Instructions (Addendum)
Informed Annick Neidert about breast self awareness. Patient did not need a Pap smear today due to last Pap smear was on 06/04/2019. Let her know [facility]CP will cover Pap smears and HPV typing every 5 years unless has a history of abnormal Pap smears. Referred patient to the [facility] of [facility] for screening mammogram. Appointment scheduled for July 09, 2019 at 9:50am on the mobile unit. Patient aware of appointment and will be there. Let patient know the Breast Center will follow up with her within the next couple weeks with results of her mammogram by letter or phone. Tiffany Jacobs verbalized understanding.  Mila Homer, RN, FNP student 10:36 AM

## 2019-07-17 ENCOUNTER — Ambulatory Visit: Payer: Self-pay | Admitting: Podiatry

## 2019-08-02 ENCOUNTER — Encounter (INDEPENDENT_AMBULATORY_CARE_PROVIDER_SITE_OTHER): Payer: No Typology Code available for payment source | Admitting: Podiatry

## 2019-08-02 NOTE — Progress Notes (Signed)
This encounter was created in error - please disregard.

## 2019-08-06 ENCOUNTER — Encounter: Payer: Self-pay | Admitting: Nurse Practitioner

## 2019-08-06 ENCOUNTER — Ambulatory Visit: Payer: Medicaid - Out of State | Attending: Nurse Practitioner | Admitting: Nurse Practitioner

## 2019-08-06 ENCOUNTER — Other Ambulatory Visit: Payer: Self-pay

## 2019-08-06 DIAGNOSIS — K2100 Gastro-esophageal reflux disease with esophagitis, without bleeding: Secondary | ICD-10-CM

## 2019-08-06 DIAGNOSIS — F419 Anxiety disorder, unspecified: Secondary | ICD-10-CM

## 2019-08-06 DIAGNOSIS — E1142 Type 2 diabetes mellitus with diabetic polyneuropathy: Secondary | ICD-10-CM | POA: Diagnosis not present

## 2019-08-06 DIAGNOSIS — K5909 Other constipation: Secondary | ICD-10-CM

## 2019-08-06 DIAGNOSIS — Z794 Long term (current) use of insulin: Secondary | ICD-10-CM

## 2019-08-06 DIAGNOSIS — F329 Major depressive disorder, single episode, unspecified: Secondary | ICD-10-CM

## 2019-08-06 DIAGNOSIS — F32A Depression, unspecified: Secondary | ICD-10-CM

## 2019-08-06 MED ORDER — HYDROXYZINE HCL 25 MG PO TABS: 25.0000 mg | ORAL_TABLET | Freq: Three times a day (TID) | ORAL | 2 refills | Status: AC | PRN

## 2019-08-06 MED ORDER — GABAPENTIN 300 MG PO CAPS: 600.0000 mg | ORAL_CAPSULE | Freq: Three times a day (TID) | ORAL | 1 refills | Status: DC

## 2019-08-06 MED ORDER — PANTOPRAZOLE SODIUM 40 MG PO TBEC: 40.0000 mg | DELAYED_RELEASE_TABLET | Freq: Every day | ORAL | 1 refills | Status: DC

## 2019-08-06 MED ORDER — PAROXETINE HCL 20 MG PO TABS: 20.0000 mg | ORAL_TABLET | Freq: Every day | ORAL | 1 refills | Status: DC

## 2019-08-06 MED ORDER — INSULIN DETEMIR 100 UNIT/ML FLEXPEN: 30.0000 [IU] | PEN_INJECTOR | Freq: Two times a day (BID) | SUBCUTANEOUS | 6 refills | Status: DC

## 2019-08-06 MED ORDER — LINACLOTIDE 72 MCG PO CAPS: 72.0000 ug | ORAL_CAPSULE | Freq: Every day | ORAL | 2 refills | Status: DC

## 2019-08-06 NOTE — Progress Notes (Signed)
Virtual Visit via Telephone Note Due to national recommendations of social distancing due to COVID 19, telehealth visit is felt to be most appropriate for this patient at this time.  I discussed the limitations, risks, security and privacy concerns of performing an evaluation and management service by telephone and the availability of in person appointments. I also discussed with the patient that there may be a patient responsible charge related to this service. The patient expressed understanding and agreed to proceed.    I connected with Denise Hale on 08/06/19  at  11:10 AM EDT  EDT by telephone and verified that I am speaking with the correct person using two identifiers.   Consent I discussed the limitations, risks, security and privacy concerns of performing an evaluation and management service by telephone and the availability of in person appointments. I also discussed with the patient that there may be a patient responsible charge related to this service. The patient expressed understanding and agreed to proceed.   [name]   [facility] Provider[facility]    Persons participating in Telemedicine visit: Bertram Denver FNP-BC YY McBride CMA Rahima Reith    History of Present Illness: Telemedicine visit for: F/U Anxiety and Depression  She was started on Cymbalta 30 mg and hydroxyzine 10 mg TID in June at an office visit. She feels her anxiety has not decreased however her depression symptoms have improved.  States she has a probation violation and will have to serve time in jail over the weekends. She is very upset that the jail staff has not been giving her the correct diabetes medications. She is requesting a letter to the jail staff She will have to spend a few days in jail over the weeknds for probation violations.  Depression screen [facility] 2/9 08/06/2019 07/02/2019 04/22/2019 04/01/2019  Decreased Interest 2 1 2  -  Down,  Depressed, Hopeless 2 1 3 3   PHQ - 2 Score 4 2 5 3   Altered sleeping 1 2 2 3   Tired, decreased energy 1 1 1 3   Change in appetite 1 2 2 3   Feeling bad or failure about yourself  2 1 1 3   Trouble concentrating 2 1 1 3   Moving slowly or fidgety/restless 0 2 2 0  Suicidal thoughts 0 0 1 0  PHQ-9 Score 11 11 15 18    GAD 7 : Generalized Anxiety Score 08/06/2019 07/02/2019 04/22/2019 04/01/2019  Nervous, Anxious, on Edge 3 1 1 3   Control/stop worrying 2 1 2 3   Worry too much - different things 3 1 1 3   Trouble relaxing 0 1 1 3   Restless 0 2 2 2   Easily annoyed or irritable 2 1 1 2   Afraid - awful might happen 0 0 0 0  Total GAD 7 Score 10 7 8 16    She was started on Linzess for chronic constipation. Now have explosive bowel movements. Will decrease to 72 mcg.     Past Medical History:  Diagnosis Date  . Anxiety   . Asthma   . Diabetes mellitus without complication (HCC)   . Diverticulitis   . GERD (gastroesophageal reflux disease)     Past Surgical History:  Procedure Laterality Date  . CHOLECYSTECTOMY    . COLON SURGERY      Family History  Problem Relation Age of Onset  . Diabetes Mother   . Thyroid disease Mother   . Heart disease Father   . Breast cancer Paternal Aunt   .  Breast cancer Paternal Aunt     Social History   Socioeconomic History  . Marital status: Unknown    Spouse name: Not on file  . Number of children: 2  . Years of education: Not on file  . Highest education level: Some college, no degree  Occupational History  . Not on file  Tobacco Use  . Smoking status: Former Games developer  . Smokeless tobacco: Never Used  Vaping Use  . Vaping Use: Never used  Substance and Sexual Activity  . Alcohol use: Yes    Comment: occasionally  . Drug use: Yes    Types: Marijuana  . Sexual activity: Yes    Birth control/protection: None  Other Topics Concern  . Not on file  Social History Narrative  . Not on file   Social Determinants of Health   Financial  Resource Strain:   . Difficulty of Paying Living Expenses:   Food Insecurity:   . Worried About Programme researcher, broadcasting/film/video in the Last Year:   . Barista in the Last Year:   Transportation Needs: No Transportation Needs  . Lack of Transportation (Medical): No  . Lack of Transportation (Non-Medical): No  Physical Activity:   . Days of Exercise per Week:   . Minutes of Exercise per Session:   Stress:   . Feeling of Stress :   Social Connections:   . Frequency of Communication with Friends and Family:   . Frequency of Social Gatherings with Friends and Family:   . Attends Religious Services:   . Active Member of Clubs or Organizations:   . Attends Banker Meetings:   Marland Kitchen Marital Status:      Observations/Objective: Awake, alert and oriented x 3   Review of Systems  Constitutional: Negative for fever, malaise/fatigue and weight loss.  HENT: Negative.  Negative for nosebleeds.   Eyes: Negative.  Negative for blurred vision, double vision and photophobia.  Respiratory: Negative.  Negative for cough and shortness of breath.   Cardiovascular: Negative.  Negative for chest pain, palpitations and leg swelling.  Gastrointestinal: Positive for constipation, diarrhea and heartburn. Negative for nausea and vomiting.  Musculoskeletal: Negative.  Negative for myalgias.  Neurological: Negative.  Negative for dizziness, focal weakness, seizures and headaches.  Psychiatric/Behavioral: Positive for depression. Negative for suicidal ideas. The patient is nervous/anxious.     Assessment and Plan: Denise Hale was seen today for follow-up.  Diagnoses and all orders for this visit:  Anxiety and depression -     PARoxetine (PAXIL) 20 MG tablet; Take 1 tablet (20 mg total) by mouth daily. -     hydrOXYzine (ATARAX/VISTARIL) 25 MG tablet; Take 1 tablet (25 mg total) by mouth 3 (three) times daily as needed.  Chronic constipation -     linaclotide (LINZESS) 72 MCG capsule; Take 1 capsule (72  mcg total) by mouth daily before breakfast.  Type 2 diabetes mellitus with diabetic polyneuropathy, with long-term current use of insulin (HCC) -     gabapentin (NEURONTIN) 300 MG capsule; Take 2 capsules (600 mg total) by mouth 3 (three) times daily. -     insulin detemir (LEVEMIR) 100 UNIT/ML FlexPen; Inject 30 Units into the skin 2 (two) times daily. Continue blood sugar control as discussed in office today, low carbohydrate diet, and regular physical exercise as tolerated, 150 minutes per week (30 min each day, 5 days per week, or 50 min 3 days per week). Keep blood sugar logs with fasting goal of 90-130 mg/dl,  post prandial (after you eat) less than 180.  For Hypoglycemia: BS <60 and Hyperglycemia BS >400; contact the clinic ASAP. Annual eye exams and foot exams are recommended.   Gastroesophageal reflux disease with esophagitis without hemorrhage -     pantoprazole (PROTONIX) 40 MG tablet; Take 1 tablet (40 mg total) by mouth daily.INSTRUCTIONS: Avoid GERD Triggers: acidic, spicy or fried foods, caffeine, coffee, sodas,  alcohol and chocolate.     Follow Up Instructions Return in about 8 weeks (around 10/01/2019).     I discussed the assessment and treatment plan with the patient. The patient was provided an opportunity to ask questions and all were answered. The patient agreed with the plan and demonstrated an understanding of the instructions.   The patient was advised to call back or seek an in-person evaluation if the symptoms worsen or if the condition fails to improve as anticipated.  I provided 19 minutes of non-face-to-face time during this encounter including median intraservice time, reviewing previous notes, labs, imaging, medications and explaining diagnosis and management.  Claiborne Rigg, FNP-BC

## 2019-08-08 MED FILL — PANTOPRAZOLE SOD DR 40 MG T: 40 | 30 days supply | Qty: 30 | Fill #0

## 2019-08-08 MED FILL — GABAPENTIN 300 MG CAPSULE: 300 | 30 days supply | Qty: 180 | Fill #0

## 2019-08-08 MED FILL — LEVEMIR FLEXTOUCH 100 UNITS: 100 | 15 days supply | Qty: 9 | Fill #0

## 2019-08-08 MED FILL — hydrOXYzine HCL 25 MG TABS: 25 | 30 days supply | Qty: 90 | Fill #0

## 2019-08-08 MED FILL — PARoxetine HCL 20 MG TABS: 20 | 30 days supply | Qty: 30 | Fill #0

## 2019-08-08 MED FILL — LINZESS 72 MCG CAPSULE: 72 | 30 days supply | Qty: 30 | Fill #0

## 2019-09-12 MED FILL — GABAPENTIN 300 MG CAPSULE: 300 | 30 days supply | Qty: 180 | Fill #1

## 2019-09-12 MED FILL — NOVOLOG FLEXPEN SYRINGE: 100 | 37 days supply | Qty: 3 | Fill #0

## 2019-09-12 MED FILL — LINZESS 72 MCG CAPSULE: 72 | 30 days supply | Qty: 30 | Fill #1

## 2019-09-12 MED FILL — PARoxetine HCL 20 MG TABS: 20 | 30 days supply | Qty: 30 | Fill #1

## 2019-09-12 MED FILL — LEVEMIR FLEXTOUCH 100 UNITS: 100 | 15 days supply | Qty: 9 | Fill #1

## 2019-09-12 MED FILL — hydrOXYzine HCL 25 MG TABS: 25 | 30 days supply | Qty: 90 | Fill #1

## 2019-10-02 ENCOUNTER — Ambulatory Visit: Payer: Medicaid - Out of State | Admitting: Nurse Practitioner

## 2019-10-23 ENCOUNTER — Ambulatory Visit: Payer: Self-pay | Admitting: Physician Assistant

## 2019-10-23 ENCOUNTER — Other Ambulatory Visit: Payer: Self-pay

## 2019-10-30 ENCOUNTER — Other Ambulatory Visit: Payer: Self-pay | Admitting: Family Medicine

## 2019-10-30 ENCOUNTER — Ambulatory Visit: Payer: 59 | Attending: Nurse Practitioner | Admitting: Internal Medicine

## 2019-10-30 ENCOUNTER — Other Ambulatory Visit: Payer: Self-pay

## 2019-10-30 ENCOUNTER — Encounter: Payer: Self-pay | Admitting: Internal Medicine

## 2019-10-30 ENCOUNTER — Ambulatory Visit (HOSPITAL_BASED_OUTPATIENT_CLINIC_OR_DEPARTMENT_OTHER): Payer: 59 | Admitting: Pharmacist

## 2019-10-30 VITALS — BP 165/84 | HR 95 | Resp 16 | Ht 66.0 in | Wt 150.8 lb

## 2019-10-30 DIAGNOSIS — Z23 Encounter for immunization: Secondary | ICD-10-CM

## 2019-10-30 DIAGNOSIS — E1142 Type 2 diabetes mellitus with diabetic polyneuropathy: Secondary | ICD-10-CM

## 2019-10-30 DIAGNOSIS — E1151 Type 2 diabetes mellitus with diabetic peripheral angiopathy without gangrene: Secondary | ICD-10-CM | POA: Diagnosis not present

## 2019-10-30 DIAGNOSIS — Z794 Long term (current) use of insulin: Secondary | ICD-10-CM | POA: Diagnosis not present

## 2019-10-30 DIAGNOSIS — E119 Type 2 diabetes mellitus without complications: Secondary | ICD-10-CM | POA: Insufficient documentation

## 2019-10-30 LAB — POCT GLYCOSYLATED HEMOGLOBIN (HGB A1C): HbA1c, POC (controlled diabetic range): 10.7 % — AB (ref 0.0–7.0)

## 2019-10-30 LAB — GLUCOSE, POCT (MANUAL RESULT ENTRY): POC Glucose: 293 mg/dl — AB (ref 70–99)

## 2019-10-30 MED ORDER — TRULICITY 0.75 MG/0.5ML ~~LOC~~ SOAJ: 0.7500 mg | SUBCUTANEOUS | 0 refills | Status: DC

## 2019-10-30 MED FILL — TRULICITY 0.75 MG/0.5 ML PE: 0.75 | 28 days supply | Qty: 2 | Fill #0

## 2019-10-30 MED FILL — hydrOXYzine HCL 25 MG TABS: 25 | 30 days supply | Qty: 90 | Fill #2

## 2019-10-30 MED FILL — PARoxetine HCL 20 MG TABS: 20 | 30 days supply | Qty: 30 | Fill #2

## 2019-10-30 NOTE — Assessment & Plan Note (Signed)
Poorly controlled- A1c>10 She has known complications including microalbunuria.   i'll have her see pharmacy to review meds.   Discussed need for medication compliance she is on  Insulin but will need additional meds. She tells me that she previously could not tolerate metformin-  Discussed with pharmacy who will see her today (consider glp1 agonist)

## 2019-10-30 NOTE — Progress Notes (Signed)
Patient presents for vaccination against influenza per orders of Dr. Cato Mulligan. Consent given. Counseling provided. No contraindications exists. Vaccine administered without incident.   Patient was educated on the use of the Trulicity pen. Reviewed necessary supplies and operation of the pen. Also reviewed goal blood glucose levels. Patient was able to demonstrate use. All questions and concerns were addressed.  Butch Penny, PharmD, CPP Clinical Pharmacist [facility] [phone]

## 2019-10-30 NOTE — Progress Notes (Signed)
DM f/u- home cbgs in the 120-130 but in the past few weeks has been "higher" (200+). She is drinking and urinating a "normal amount".  She states that she is not taking medications as prescribed. She also admits to overeating and eats late and can eat "junk". She works in [employer].   Lipids- prescribed lipitor and she is taking it.   Right fourth finger- she describes a trigger finger  Past Medical History:  Diagnosis Date  . Anxiety   . Asthma   . Diabetes mellitus without complication ([place])   . Diverticulitis   . GERD (gastroesophageal reflux disease)     Social History   Socioeconomic History  . Marital status: Unknown    Spouse name: Not on file  . Number of children: 2  . Years of education: Not on file  . Highest education level: Some college, no degree  Occupational History  . Not on file  Tobacco Use  . Smoking status: Former Research scientist (life sciences)  . Smokeless tobacco: Never Used  Vaping Use  . Vaping Use: Never used  Substance and Sexual Activity  . Alcohol use: Yes    Comment: occasionally  . Drug use: Yes    Types: Marijuana  . Sexual activity: Yes    Birth control/protection: None  Other Topics Concern  . Not on file  Social History Narrative  . Not on file   Social Determinants of Health   Financial Resource Strain:   . Difficulty of Paying Living Expenses: Not on file  Food Insecurity:   . Worried About Charity fundraiser in the Last Year: Not on file  . Ran Out of Food in the Last Year: Not on file  Transportation Needs: No Transportation Needs  . Lack of Transportation (Medical): No  . Lack of Transportation (Non-Medical): No  Physical Activity:   . Days of Exercise per Week: Not on file  . Minutes of Exercise per Session: Not on file  Stress:   . Feeling of Stress : Not on file  Social Connections:   . Frequency of Communication with Friends and Family: Not on file  . Frequency of Social Gatherings with Friends and Family: Not on file  .  Attends Religious Services: Not on file  . Active Member of Clubs or Organizations: Not on file  . Attends Archivist Meetings: Not on file  . Marital Status: Not on file  Intimate Partner Violence:   . Fear of Current or Ex-Partner: Not on file  . Emotionally Abused: Not on file  . Physically Abused: Not on file  . Sexually Abused: Not on file    Past Surgical History:  Procedure Laterality Date  . CHOLECYSTECTOMY    . COLON SURGERY      Family History  Problem Relation Age of Onset  . Diabetes Mother   . Thyroid disease Mother   . Heart disease Father   . Breast cancer Paternal Aunt   . Breast cancer Paternal Aunt     Allergies  Allergen Reactions  . Ciprofloxacin     Current Outpatient Medications on File Prior to Visit  Medication Sig Dispense Refill  . hydrOXYzine (ATARAX/VISTARIL) 25 MG tablet Take 1 tablet (25 mg total) by mouth 3 (three) times daily as needed. 90 tablet 2  . insulin aspart (NOVOLOG FLEXPEN) 100 UNIT/ML FlexPen For blood sugars 0-150 give 0 units of insulin, 151-200 give 2 units of insulin, 201-250 give 4 units, 251-300 give 6 units, 301-350 give 8  units, 351-400 give 10 units,> 400 give 12 units and call M.D. Discussed hypoglycemia protocol. 15 mL 11  . linaclotide (LINZESS) 72 MCG capsule Take 1 capsule (72 mcg total) by mouth daily before breakfast. 90 capsule 2  . lisinopril (ZESTRIL) 5 MG tablet Take 1 tablet (5 mg total) by mouth daily. 90 tablet 3  . PARoxetine (PAXIL) 20 MG tablet Take 1 tablet (20 mg total) by mouth daily. 90 tablet 1  . atorvastatin (LIPITOR) 20 MG tablet Take 1 tablet (20 mg total) by mouth daily. 90 tablet 3  . Blood Glucose Monitoring Suppl (TRUE METRIX METER) w/Device KIT Use as instructed. Check blood glucose level by fingerstick twice per day.   E11.65 1 kit 0  . DULoxetine (CYMBALTA) 30 MG capsule Take 1 capsule (30 mg total) by mouth daily. 90 capsule 1  . gabapentin (NEURONTIN) 300 MG capsule Take 2  capsules (600 mg total) by mouth 3 (three) times daily. 180 capsule 1  . glucose blood (TRUE METRIX BLOOD GLUCOSE TEST) test strip Use as instructed. Check blood glucose level by fingerstick twice per day.   E11.65 100 each 12  . insulin detemir (LEVEMIR) 100 UNIT/ML FlexPen Inject 30 Units into the skin 2 (two) times daily. 18 mL 6  . Insulin Pen Needle (B-D UF III MINI PEN NEEDLES) 31G X 5 MM MISC Use as instructed. Inject into the skin four times daily. E11.65 200 each 6  . TRUEplus Lancets 28G MISC Use as instructed. Check blood glucose level by fingerstick twice per day.   E11.65 100 each 3   No current facility-administered medications on file prior to visit.     patient denies chest pain, shortness of breath, orthopnea. Denies lower extremity edema, abdominal pain, change in appetite, change in bowel movements. Patient denies rashes, musculoskeletal complaints. No other specific complaints in a complete review of systems. She endorses some hair loos- STATES that she notices some increased hair loss in the past 4 months.   BP (!) 165/84   Pulse 95   Resp 16   Ht '5\' 6"'  (1.676 m)   Wt 150 lb 12.8 oz (68.4 kg)   SpO2 99%   BMI 24.34 kg/m     Well-developed well-nourished female in no acute distress. HEENT exam atraumatic, normocephalic, extraocular muscles are intact. Neck is supple. No jugular venous distention no thyromegaly. Chest clear to auscultation without increased work of breathing. Cardiac exam S1 and S2 are regular. Abdominal exam active bowel sounds, soft, nontender. Extremities no edema. Neurologic exam she is alert without any motor sensory deficits. Gait is normal.  DM2 (diabetes mellitus, type 2) ([place]) Poorly controlled- A1c>10 She has known complications including microalbunuria.   i'll have her see pharmacy to review meds.   Discussed need for medication compliance she is on  Insulin but will need additional meds. She tells me that she previously could not tolerate  metformin-  Discussed with pharmacy who will see her today (consider glp1 agonist)   She likely has trigger finger- f/u next visit She describes subjective alopecia- recenikt tsh normal. Could be related to poorly controlled DM

## 2019-10-31 ENCOUNTER — Telehealth: Payer: Self-pay

## 2019-10-31 LAB — LIPID PANEL
Chol/HDL Ratio: 2 ratio (ref 0.0–4.4)
Cholesterol, Total: 144 mg/dL (ref 100–199)
HDL: 71 mg/dL (ref >39)
LDL Chol Calc (NIH): 58 mg/dL (ref 0–99)
Triglycerides: 80 mg/dL (ref 0–149)
VLDL Cholesterol Cal: 15 mg/dL (ref 5–40)

## 2019-10-31 NOTE — Telephone Encounter (Signed)
Copied from CRM [id number]. Topic: General - Inquiry >> Oct 31, 2019  4:59 PM Adrian Prince D wrote: Reason for CRM: Patient called and asked if you could get her a docotr's note, she was in the office on yesterday but she was unable to return to work today because she was sick on the stomach. She would like a call back when the note is ready at [phone]. Please advise

## 2019-11-01 NOTE — Telephone Encounter (Signed)
Spoke to patient. Patient can come and pick up her letter stating she was present for her doctor visit for her job.

## 2019-11-04 ENCOUNTER — Telehealth: Payer: Self-pay | Admitting: Nurse Practitioner

## 2019-11-04 NOTE — Telephone Encounter (Signed)
Copied from CRM [id number]. Topic: General - Other >> Nov 04, 2019 10:23 AM Jaquita Rector A wrote: Reason for CRM: Patient would like a call back with her lab results please Ph# [phone]

## 2019-11-07 NOTE — Telephone Encounter (Signed)
Will call patient once provider review the results.

## 2019-11-11 NOTE — Telephone Encounter (Signed)
Pt returning call for lab results. Pt wants to know what he wants her to do about her hand.  It is still hurting her.  Provider advised labs first, then he would advise. Looks at though provider has released.  But nothing about hand.  Thanks.

## 2019-11-14 ENCOUNTER — Telehealth: Payer: Self-pay

## 2019-11-14 NOTE — Telephone Encounter (Signed)
Will route to Provider for lab results.

## 2019-11-14 NOTE — Telephone Encounter (Signed)
Patient is having bad hand pain due to carpel tunnel. Pt. Is having pain on both of her hands. Pt. Would like PCP recommendation on pain medication or referral for her hand pain.

## 2019-11-17 ENCOUNTER — Other Ambulatory Visit: Payer: Self-pay | Admitting: Nurse Practitioner

## 2019-11-17 DIAGNOSIS — F32A Depression, unspecified: Secondary | ICD-10-CM

## 2019-11-17 DIAGNOSIS — E1142 Type 2 diabetes mellitus with diabetic polyneuropathy: Secondary | ICD-10-CM

## 2019-11-17 DIAGNOSIS — F419 Anxiety disorder, unspecified: Secondary | ICD-10-CM

## 2019-11-17 DIAGNOSIS — Z794 Long term (current) use of insulin: Secondary | ICD-10-CM

## 2019-11-17 MED ORDER — DULOXETINE HCL 30 MG PO CPEP: 30.0000 mg | ORAL_CAPSULE | Freq: Every day | ORAL | 1 refills | Status: DC

## 2019-11-17 MED ORDER — IBUPROFEN 600 MG PO TABS: 600.0000 mg | ORAL_TABLET | Freq: Three times a day (TID) | ORAL | 1 refills | Status: DC | PRN

## 2019-11-17 MED ORDER — GABAPENTIN 600 MG PO TABS: 600.0000 mg | ORAL_TABLET | Freq: Three times a day (TID) | ORAL | 0 refills | Status: DC

## 2019-11-17 NOTE — Telephone Encounter (Signed)
Hand splints for 4 weeks. Will refill gabapentin and send naproxen. Needs televisit in 4 weeks for follow pu

## 2019-11-18 ENCOUNTER — Other Ambulatory Visit: Payer: Self-pay | Admitting: Pharmacist

## 2019-11-18 DIAGNOSIS — E1142 Type 2 diabetes mellitus with diabetic polyneuropathy: Secondary | ICD-10-CM

## 2019-11-18 MED ORDER — ONETOUCH DELICA PLUS LANCET33G MISC: 2 refills | Status: DC

## 2019-11-18 MED ORDER — ONETOUCH VERIO REFLECT W/DEVICE KIT: PACK | 0 refills | Status: AC

## 2019-11-18 MED ORDER — ONETOUCH VERIO VI STRP: ORAL_STRIP | 2 refills | Status: DC

## 2019-11-18 MED FILL — NOVOLOG FLEXPEN SYRINGE: 100 | 37 days supply | Qty: 3 | Fill #1

## 2019-11-18 MED FILL — GABAPENTIN 600 MG TABLET: 600 | 30 days supply | Qty: 90 | Fill #0

## 2019-11-18 MED FILL — IBUPROFEN 600 MG TABLET: 600 | 20 days supply | Qty: 60 | Fill #0

## 2019-11-18 MED FILL — DULoxetine HCL 30 MG CPEP: 30 | 90 days supply | Qty: 90 | Fill #0

## 2019-11-18 NOTE — Telephone Encounter (Signed)
Patient checking on the status of message below °

## 2019-11-18 NOTE — Telephone Encounter (Signed)
Attempt to reach patient to inform on PCP advising. °No answer and LVM.  °

## 2019-11-22 MED FILL — TRULICITY 0.75 MG/0.5 ML PE: 0.75 | 28 days supply | Qty: 2 | Fill #0

## 2019-11-26 NOTE — Progress Notes (Unsigned)
S:     PCP: Bertram Denver, NP MPH: T2DM, asthma, anxiety, GERD  Patient arrives in good spirits.Presents for diabetes evaluation, education, and management.   Patient was referred on 10/30/19. Patient was last seen by Primary Care Provider on 08/06/19. At visit on 10/30/19 with Dr. Cato Mulligan, patient reports sugars increased to >200, not taking medications appropriately and not eating a healthy diet. Trulicity 0.75 mg weekly was added to DM regimen and educated on use of Trulicity pen by pharmacy.  Patient reports Diabetes was diagnosed in ***.   Today, patient reports ***  A1C = Check Clinic BG Review medications and adherence (timing of meds, etc.)  Ate or drank anything prior to visit today? At home BGs?  Marland Kitchen Highs . Lows  Hyperglycemia sx (nocturia, neuropathy, visual changes, foot exams) Hypoglycemia symptoms (dizziness, shaky, sweating, hungry, confusion) Diet Exercise  Plan: -Increase Trulicity to 1.5 mg daily if tolerating well with no GI ADEs  Family/Social History:  -Fhx: diabetes in mother; heart disease in father -Tobacco use: former smoker -Alcohol use: occassionally -Illicit drug use: marijuana  Insurance coverage/medication affordability: [employer] and Medicaid  Medication adherence reported *** .   Current diabetes medications include: Trulicity 0.75 mg weekly, Levemir 30 units BID, Novolog SSI TID w/meals Current hypertension medications include: lisinopril 5 mg daily Current hyperlipidemia medications include: atorvastatin 20 mg daily  Patient {Actions; denies-reports:120008} hypoglycemic events.  Patient reported dietary habits: Eats *** meals/day Breakfast:*** Lunch:*** Dinner:*** Snacks:*** Drinks:***  Patient-reported exercise habits: ***   Patient {Actions; denies-reports:120008} nocturia (nighttime urination).  Patient {Actions; denies-reports:120008} neuropathy (nerve pain). Patient {Actions; denies-reports:120008} visual  changes. Patient {Actions; denies-reports:120008} self foot exams.     O:  POCT:  Home fasting blood sugars: ***  2 hour post-meal/random blood sugars: ***.     Lab Results  Component Value Date   HGBA1C 10.7 (A) 10/30/2019   There were no vitals filed for this visit.  Lipid Panel     Component Value Date/Time   CHOL 144 10/30/2019 1433   TRIG 80 10/30/2019 1433   HDL 71 10/30/2019 1433   CHOLHDL 2.0 10/30/2019 1433   LDLCALC 58 10/30/2019 1433     Clinical Atherosclerotic Cardiovascular Disease (ASCVD): No  The 10-year ASCVD risk score Denman George DC Jr., et al., 2013) is: 5.8%   Values used to calculate the score:     Age: 49 years     Sex: Female     Is Non-Hispanic African American: Yes     Diabetic: Yes     Tobacco smoker: No     Systolic Blood Pressure: 165 mmHg     Is BP treated: No     HDL Cholesterol: 71 mg/dL     Total Cholesterol: 144 mg/dL    A/P: Diabetes longstanding*** currently ***. Patient is *** able to verbalize appropriate hypoglycemia management plan. Medication adherence appears ***. Control is suboptimal due to ***. -{Meds adjust:18428} basal insulin *** (insulin ***). Patient will continue to titrate 1 unit every *** days if fasting blood sugar > 100mg /dl until fasting blood sugars reach goal or next visit.  -{Meds adjust:18428}  rapid insulin *** (insulin ***) to ***.  -{Meds adjust:18428} GLP-1 *** (generic name***) to ***.  -{Meds adjust:18428} SGLT2-I *** (generic name***) to ***. Counseled on sick day rules for ***. -Extensively discussed pathophysiology of diabetes, recommended lifestyle interventions, dietary effects on blood sugar control -Counseled on s/sx of and management of hypoglycemia -Next A1C anticipated ***.   ASCVD risk - primary***secondary  prevention in patient with diabetes. Last LDL {Is/is not:9024} controlled. ASCVD risk score {Is/is not:9024} >20%  - {Desc; low/moderate/high:110033} intensity statin indicated. Aspirin  {Is/is not:9024} indicated.  -{Meds adjust:18428} aspirin *** mg  -{Meds adjust:18428} ***statin *** mg.   Hypertension longstanding*** currently ***.  Blood pressure goal = *** mmHg. Medication adherence ***.  Blood pressure control is suboptimal due to ***. -***  Written patient instructions provided.  Total time in face to face counseling *** minutes.   Follow up Pharmacist/PCP*** Clinic Visit in ***.   Fabio Neighbors, PharmD PGY2 Ambulatory Care Resident [facility]  Pharmacy

## 2019-11-27 ENCOUNTER — Ambulatory Visit: Payer: 59 | Attending: Nurse Practitioner | Admitting: Pharmacist

## 2019-11-27 ENCOUNTER — Ambulatory Visit: Payer: 59 | Admitting: Pharmacist

## 2019-11-27 ENCOUNTER — Other Ambulatory Visit: Payer: Self-pay | Admitting: Family Medicine

## 2019-11-27 ENCOUNTER — Encounter: Payer: Self-pay | Admitting: Pharmacist

## 2019-11-27 ENCOUNTER — Other Ambulatory Visit: Payer: Self-pay

## 2019-11-27 DIAGNOSIS — E1142 Type 2 diabetes mellitus with diabetic polyneuropathy: Secondary | ICD-10-CM

## 2019-11-27 DIAGNOSIS — Z794 Long term (current) use of insulin: Secondary | ICD-10-CM

## 2019-11-27 MED ORDER — INSULIN DETEMIR 100 UNIT/ML FLEXPEN: 35.0000 [IU] | PEN_INJECTOR | Freq: Two times a day (BID) | SUBCUTANEOUS | 3 refills | Status: DC

## 2019-11-27 MED ORDER — TRULICITY 1.5 MG/0.5ML ~~LOC~~ SOAJ: 1.5000 mg | SUBCUTANEOUS | 2 refills | Status: DC

## 2019-11-27 MED FILL — LEVEMIR FLEXTOUCH 100 UNITS: 100 | 30 days supply | Qty: 21 | Fill #0

## 2019-11-27 NOTE — Progress Notes (Signed)
Virtual Visit via Telephone Note Due to national recommendations of social distancing due to COVID 19, telehealth visit is felt to be most appropriate for this patient at this time. I discussed the limitations, risks, security and privacy concerns of performing an evaluation and management service by telephone and the availability of in person appointments. I also discussed with the patient that there may be a patient responsible charge related to this service. The patient expressed understanding and agreed to proceed.   I connected withDiane Hollingheadon11/24/2021 @ 10:30 AM EDT by telephoneand verified that I am speaking with the correct person using two identifiers.  Consent I discussed the limitations, risks, security and privacy concerns of performing an evaluation and management service by telephone and the availability of in person appointments. I also discussed with the patient that there may be a patient responsible charge related to this service. The patient expressed understanding and agreed to proceed.  Location of Patient: Private[facility]idence  Location of Provider: [facility]   Persons participating in Telemedicine visit: Myself  Patient      S:     No chief complaint on file.  Patient in good spirits.  Presents for diabetes evaluation, education, and management. Patient was referred on 10/30/2019 by Dr. Cato Mulligan. Trulicity was started at that visit.    Family/Social History:  FHx: DM, heart disease; of note, positive hx of thyroid diease but no thyroid cancer Tobacco: former smoker Alcohol: endorses occasional use   Insurance coverage/medication affordability: [employer]  Medication adherence reported, however, she just started her Trulicity on 11/23/19.   Current diabetes medications include: Dulaglutide 0.75 mg weekly, aspart sliding scale, Levemir 35 units BID  Patient denies hypoglycemic events.  Patient reported  dietary habits: - Admits to dietary indiscretion   Patient-reported exercise habits: none    Patient denies nocturia (nighttime urination).  Patient reports baseline neuropathy (nerve pain). Patient denies visual changes. Patient reports self foot exams.     O:  Physical Exam   ROS   Lab Results  Component Value Date   HGBA1C 10.7 (A) 10/30/2019   There were no vitals filed for this visit.  Lipid Panel     Component Value Date/Time   CHOL 144 10/30/2019 1433   TRIG 80 10/30/2019 1433   HDL 71 10/30/2019 1433   CHOLHDL 2.0 10/30/2019 1433   LDLCALC 58 10/30/2019 1433    Home fasting blood sugars: since starting Trulicity, reports 84 - 174  2 hour post-meal/random blood sugars: not checking    Clinical Atherosclerotic Cardiovascular Disease (ASCVD): No  The 10-year ASCVD risk score Denman George DC Jr., et al., 2013) is: 5.8%   Values used to calculate the score:     Age: 49 years     Sex: Female     Is Non-Hispanic African American: Yes     Diabetic: Yes     Tobacco smoker: No     Systolic Blood Pressure: 165 mmHg     Is BP treated: No     HDL Cholesterol: 71 mg/dL     Total Cholesterol: 144 mg/dL    A/P: Diabetes longstanding currently uncontrolled but home CBGs look to be improving. Patient is able to verbalize appropriate hypoglycemia management plan. Medication adherence reported. She denies side effects withTrulicity. Control is suboptimal due to dietary indiscretion and physical inactivity.  Will order Trulicity 1.5 mg weekly for pharmacy to fill after she completes four weeks of her current, starting dose.   -Increased dose of Trulicity to  1.5 mg weekly. Pt to pick-up after completing 4 weeks of 0.75 mg weekly dose.  -Continued other medications. -Extensively discussed pathophysiology of diabetes, recommended lifestyle interventions, dietary effects on blood sugar control -Counseled on s/sx of and management of hypoglycemia -Next A1C anticipated 01/2020.    Written patient instructions provided.  Total time counseling 30 minutes.   Follow up PCP Clinic Visit in 1 month.     Butch Penny, PharmD, CPP Clinical Pharmacist [facility] [phone]

## 2019-12-06 ENCOUNTER — Telehealth: Payer: Self-pay | Admitting: Nurse Practitioner

## 2019-12-06 NOTE — Telephone Encounter (Signed)
Patient is calling because she is still experience a lot of pain with her right hand. Patient is wearing the brace on her hand day and night and taking gabapentin (NEURONTIN) 600 MG tablet [[id number]] and it is not helping at all. Patient is requesting a referral or some additional help for her hand because she is still in a lot of pain.  Patient was told that she had trigger finger. Patient states the hand was not x-rayed. But everything she is doing is not working. Please advise CB- [phone]

## 2019-12-10 ENCOUNTER — Other Ambulatory Visit: Payer: Self-pay | Admitting: Nurse Practitioner

## 2019-12-10 DIAGNOSIS — M79641 Pain in right hand: Secondary | ICD-10-CM

## 2019-12-10 NOTE — Telephone Encounter (Signed)
Referral placed. Needs to make sure her insurance is [place] medicaid or referral will be closed and she can call hand surgeon without referral.

## 2019-12-20 ENCOUNTER — Encounter: Payer: Self-pay | Admitting: Orthopaedic Surgery

## 2019-12-20 ENCOUNTER — Ambulatory Visit (INDEPENDENT_AMBULATORY_CARE_PROVIDER_SITE_OTHER): Payer: 59 | Admitting: Orthopaedic Surgery

## 2019-12-20 ENCOUNTER — Other Ambulatory Visit: Payer: Self-pay

## 2019-12-20 VITALS — Ht 65.0 in | Wt 150.0 lb

## 2019-12-20 DIAGNOSIS — M65341 Trigger finger, right ring finger: Secondary | ICD-10-CM | POA: Diagnosis not present

## 2019-12-20 MED ORDER — LIDOCAINE HCL 1 % IJ SOLN
1.0000 mL | INTRAMUSCULAR | Status: AC | PRN
  Administered 2019-12-20: 10:00:00 1 mL

## 2019-12-20 MED ORDER — BUPIVACAINE HCL 0.25 % IJ SOLN
0.3300 mL | INTRAMUSCULAR | Status: AC | PRN
  Administered 2019-12-20: 10:00:00 .33 mL

## 2019-12-20 MED ORDER — METHYLPREDNISOLONE ACETATE 40 MG/ML IJ SUSP
13.3300 mg | INTRAMUSCULAR | Status: AC | PRN
  Administered 2019-12-20: 10:00:00 13.33 mg

## 2019-12-20 MED FILL — TRULICITY 1.5 MG/0.5 ML PEN: 1.5 | 28 days supply | Qty: 2 | Fill #0

## 2019-12-20 NOTE — Progress Notes (Signed)
Office Visit Note   Patient: Denise Hale           Date of Birth: 1970-04-20           MRN: [id number] Visit Date: 12/20/2019              Requested by: Claiborne Rigg, NP [address] [place],  [place] [address] PCP: Claiborne Rigg, NP   Assessment & Plan: Visit Diagnoses:  1. Trigger finger, right ring finger     Plan: Impression is right ring trigger finger.  We have discussed cortisone injection today for which the patient would like to proceed.  She will follow up with [place] as needed.  Follow-Up Instructions: Return if symptoms worsen or fail to improve.   Orders:  Orders Placed This Encounter  Procedures  . Hand/UE Inj: R ring A1   No orders of the defined types were placed in this encounter.     Procedures: Hand/UE Inj: R ring A1 for trigger finger on 12/20/2019 10:02 AM Indications: pain Details: 25 G needle Medications: 1 mL lidocaine 1 %; 0.33 mL bupivacaine 0.25 %; 13.33 mg methylPREDNISolone acetate 40 MG/ML      Clinical Data: No additional findings.   Subjective: Chief Complaint  Patient presents with  . 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  This began approximately 2 months ago after possibly jamming her finger on something at work.  She began having triggering shortly thereafter.  The pain and triggering is worse first thing in the morning.  She has been wearing a brace which mildly helps.  No previous cortisone injection.  No previous trigger finger.  Of note, she is a diabetic.  Review of Systems as detailed in HPI.  All others reviewed and are negative.   Objective: Vital Signs: Ht 5\' 5"  (1.651 m)   Wt 150 lb (68 kg)   BMI 24.96 kg/m   Physical Exam well-developed well-nourished female no acute distress.  Alert oriented x3.  Ortho Exam right hand ring finger exam shows a markedly tender and  palpable nodule at the A1 pulley.  She does have reproducible triggering.  She is neurovascular intact distally.  Specialty Comments:  No specialty comments available.  Imaging: No new imaging   PMFS History: Patient Active Problem List   Diagnosis Date Noted  . DM2 (diabetes mellitus, type 2) (HCC) 10/30/2019  . Influenza vaccine needed 10/30/2019   Past Medical History:  Diagnosis Date  . Anxiety   . Asthma   . Diabetes mellitus without complication (HCC)   . Diverticulitis   . GERD (gastroesophageal reflux disease)     Family History  Problem Relation Age of Onset  . Diabetes Mother   . Thyroid disease Mother   . Heart disease Father   . Breast cancer Paternal Aunt   . Breast cancer Paternal Aunt     Past Surgical History:  Procedure Laterality Date  . CHOLECYSTECTOMY    . COLON SURGERY     Social History   Occupational History  . Not on file  Tobacco Use  . Smoking status: Former 11/01/2019  . Smokeless tobacco: Never Used  Vaping Use  . Vaping Use: Never used  Substance and Sexual Activity  . Alcohol use: Not Currently    Comment: occasionally  . Drug use: Yes    Types: Marijuana  .  Sexual activity: Yes    Birth control/protection: None

## 2019-12-30 ENCOUNTER — Ambulatory Visit: Payer: Self-pay

## 2019-12-30 NOTE — Telephone Encounter (Signed)
Patient called with vomiting fever headache and body aches.  She states that she is not sure about fever but has had chills and sweats.  She states that she has not been tested for COVID-19. She has not been vaccinated for COVID-19 She is diabetic and has COPD.  She states her cough is mild and only waking up in the am. She is drinking and able to keep fluids down without vomiting.  She has cold symptoms. Per protocol patient will go to UC for evaluation today.  Note will be routed to office.  Reason for Disposition . MILD difficulty breathing (e.g., minimal/no SOB at rest, SOB with walking, pulse <100)  Answer Assessment - Initial Assessment Questions 1. COVID-19 DIAGNOSIS: "Who made your COVID-19 diagnosis?" "Was it confirmed by a positive lab test?" If not diagnosed by a HCP, ask "Are there lots of cases (community spread) where you live?" Note: See public health department website, if unsure.     none 2. COVID-19 EXPOSURE: "Was there any known exposure to COVID before the symptoms began?" CDC Definition of close contact: within 6 feet (2 meters) for a total of 15 minutes or more over a 24-hour period.     unknown 3. ONSET: "When did the COVID-19 symptoms start?"      Last wednesday 4. WORST SYMPTOM: "What is your worst symptom?" (e.g., cough, fever, shortness of breath, muscle aches)     Vomiting, cold symptoms 5. COUGH: "Do you have a cough?" If Yes, ask: "How bad is the cough?"       Cough in morning 6. FEVER: "Do you have a fever?" If Yes, ask: "What is your temperature, how was it measured, and when did it start?"    Chills, sweats 7. RESPIRATORY STATUS: "Describe your breathing?" (e.g., shortness of breath, wheezing, unable to speak)     no 8. BETTER-SAME-WORSE: "Are you getting better, staying the same or getting worse compared to yesterday?"  If getting worse, ask, "In what way?"    worse 9. HIGH RISK DISEASE: "Do you have any chronic medical problems?" (e.g., asthma, heart or lung  disease, weak immune system, obesity, etc.)    COPD,  10. VACCINE: "Have you gotten the COVID-19 vaccine?" If Yes ask: "Which one, how many shots, when did you get it?"     no 11. PREGNANCY: "Is there any chance you are pregnant?" "When was your last menstrual period?"      No does not have periods 12. OTHER SYMPTOMS: "Do you have any other symptoms?"  (e.g., chills, fatigue, headache, loss of smell or taste, muscle pain, sore throat; new loss of smell or taste especially support the diagnosis of COVID-19)       Chills, headaches,muscle pain,  Protocols used: CORONAVIRUS (COVID-19) DIAGNOSED OR SUSPECTED-A-AH

## 2020-01-10 ENCOUNTER — Other Ambulatory Visit: Payer: Self-pay

## 2020-01-10 ENCOUNTER — Telehealth: Payer: Self-pay | Admitting: Nurse Practitioner

## 2020-01-10 ENCOUNTER — Ambulatory Visit: Payer: 59 | Admitting: Nurse Practitioner

## 2020-01-10 NOTE — Telephone Encounter (Signed)
Please call patient. She Missed her televisit w/ PCP. Please assist patient on rescheduling her phone visit.

## 2020-01-10 NOTE — Telephone Encounter (Signed)
Copied from CRM [id number]. Topic: General - Other >> Jan 10, 2020  3:23 PM Denise Hale wrote: Reason for CRM: Patient called in say that she missed Hale call from [facility] say that she can be reached at Ph# [phone]

## 2020-01-10 NOTE — Telephone Encounter (Signed)
Called patient and left voicemail to call back and schedule with PCP

## 2020-01-24 ENCOUNTER — Telehealth: Payer: Self-pay | Admitting: Nurse Practitioner

## 2020-01-24 MED ORDER — TRULICITY 1.5 MG/0.5ML ~~LOC~~ SOAJ: 1.5000 mg | SUBCUTANEOUS | 2 refills | Status: DC

## 2020-01-24 NOTE — Telephone Encounter (Signed)
Medication: Dulaglutide (TRULICITY) 1.5 MG/0.5ML SOPN [[id number]] ,   Has the patient contacted their pharmacy? YES  (Agent: If no, request that the patient contact the pharmacy for the refill.) (Agent: If yes, when and what did the pharmacy advise?) [facility] Preferred Pharmacy (with phone number or street name): [facility] [address] [place], [place] [address] [phone]  Agent: Please be advised that RX refills may take up to 3 business days. We ask that you follow-up with your pharmacy.

## 2020-01-27 ENCOUNTER — Other Ambulatory Visit: Payer: Self-pay | Admitting: Family Medicine

## 2020-01-27 ENCOUNTER — Other Ambulatory Visit: Payer: Self-pay | Admitting: Pharmacist

## 2020-01-27 MED ORDER — TRULICITY 1.5 MG/0.5ML ~~LOC~~ SOAJ: 1.5000 mg | SUBCUTANEOUS | 2 refills | Status: DC

## 2020-01-27 NOTE — Telephone Encounter (Signed)
Patient is calling to see if this medication can be switched to [facility] But has run into some problems with it being approved by her insurance company.  Please advise CB-[phone]

## 2020-01-27 NOTE — Telephone Encounter (Signed)
Rx sent to our pharmacy.  

## 2020-01-28 ENCOUNTER — Other Ambulatory Visit: Payer: Self-pay | Admitting: Nurse Practitioner

## 2020-01-28 DIAGNOSIS — E1142 Type 2 diabetes mellitus with diabetic polyneuropathy: Secondary | ICD-10-CM

## 2020-01-28 MED FILL — TRULICITY 1.5 MG/0.5 ML PEN: 1.5 | 28 days supply | Qty: 2 | Fill #0

## 2020-01-28 MED FILL — GABAPENTIN 600 MG TABLET: 600 | 30 days supply | Qty: 90 | Fill #0

## 2020-02-05 ENCOUNTER — Ambulatory Visit: Payer: 59 | Admitting: Physician Assistant

## 2020-02-11 ENCOUNTER — Encounter: Payer: Self-pay | Admitting: Orthopaedic Surgery

## 2020-02-11 ENCOUNTER — Ambulatory Visit (INDEPENDENT_AMBULATORY_CARE_PROVIDER_SITE_OTHER): Payer: 59 | Admitting: Orthopaedic Surgery

## 2020-02-11 ENCOUNTER — Other Ambulatory Visit: Payer: Self-pay

## 2020-02-11 DIAGNOSIS — M65341 Trigger finger, right ring finger: Secondary | ICD-10-CM | POA: Diagnosis not present

## 2020-02-11 DIAGNOSIS — G5601 Carpal tunnel syndrome, right upper limb: Secondary | ICD-10-CM | POA: Diagnosis not present

## 2020-02-11 NOTE — Progress Notes (Signed)
Office Visit Note   Patient: Denise Hale           Date of Birth: [birth year]           MRN: [id number] Visit Date: 02/11/2020              Requested by: Claiborne Rigg, NP [address] [place],  [place] [address] PCP: Claiborne Rigg, NP   Assessment & Plan: Visit Diagnoses:  1. Trigger finger, right ring finger   2. Right carpal tunnel syndrome     Plan: Impression is right carpal tunnel syndrome and right ring trigger finger with poorly controlled diabetes.  I recommend repeating the nerve conduction studies to assess severity of carpal tunnel syndrome.  We are very limited in terms of injections as this would be counterproductive to management of her diabetes.  She does have a repeat A1c next month with PCP.  I like to see her back after she has new A1c level as well as nerve conduction studies.  Follow-Up Instructions: Return if symptoms worsen or fail to improve.   Orders:  No orders of the defined types were placed in this encounter.  No orders of the defined types were placed in this encounter.     Procedures: No procedures performed   Clinical Data: No additional findings.   Subjective: Chief Complaint  Patient presents with  . Right Hand - Pain    Denise Hale returns today for follow-up of her right carpal tunnel syndrome and right ring trigger finger.  We provide her with a trigger finger injection about 2 months ago which helped about 2 to 3 weeks.  She is a poorly controlled diabetic and on 3 different insulins and taking Trulicity.  She is having constant pain and numbness.  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  She has difficulty sleeping.  She takes ibuprofen currently.  And wears a wrist brace as well.   Review of Systems   Objective: Vital Signs: There were no vitals taken for this visit.  Physical Exam  Ortho Exam Right hand shows no muscle atrophy.  Negative carpal tunnel compressive signs.  Negative Tinel.  Strength is slightly  decreased secondary to pain.  Slight tenderness of the flexor tendon along the ring finger in the palm. Specialty Comments:  No specialty comments available.  Imaging: No results found.   PMFS History: Patient Active Problem List   Diagnosis Date Noted  . DM2 (diabetes mellitus, type 2) (HCC) 10/30/2019  . Influenza vaccine needed 10/30/2019   Past Medical History:  Diagnosis Date  . Anxiety   . Asthma   . Diabetes mellitus without complication (HCC)   . Diverticulitis   . GERD (gastroesophageal reflux disease)     Family History  Problem Relation Age of Onset  . Diabetes Mother   . Thyroid disease Mother   . Heart disease Father   . Breast cancer Paternal Aunt   . Breast cancer Paternal Aunt     Past Surgical History:  Procedure Laterality Date  . CHOLECYSTECTOMY    . COLON SURGERY     Social History   Occupational History  . Not on file  Tobacco Use  . Smoking status: Former Games developer  . Smokeless tobacco: Never Used  Vaping Use  . Vaping Use: Never used  Substance and Sexual Activity  . Alcohol use: Not Currently    Comment: occasionally  . Drug use: Yes    Types: Marijuana  . Sexual activity:  Yes    Birth control/protection: None

## 2020-02-11 NOTE — Addendum Note (Signed)
Addended by: Albertina Parr on: 02/11/2020 02:02 PM   Modules accepted: Orders

## 2020-02-27 ENCOUNTER — Encounter: Payer: Self-pay | Admitting: Physician Assistant

## 2020-02-27 ENCOUNTER — Ambulatory Visit: Payer: 59 | Attending: Nurse Practitioner | Admitting: Physician Assistant

## 2020-02-27 ENCOUNTER — Other Ambulatory Visit: Payer: Self-pay

## 2020-02-27 VITALS — BP 146/77 | HR 76 | Temp 98.7°F | Ht 66.0 in | Wt 146.0 lb

## 2020-02-27 DIAGNOSIS — Z794 Long term (current) use of insulin: Secondary | ICD-10-CM | POA: Diagnosis not present

## 2020-02-27 DIAGNOSIS — E1142 Type 2 diabetes mellitus with diabetic polyneuropathy: Secondary | ICD-10-CM

## 2020-02-27 DIAGNOSIS — F32A Depression, unspecified: Secondary | ICD-10-CM

## 2020-02-27 DIAGNOSIS — E785 Hyperlipidemia, unspecified: Secondary | ICD-10-CM | POA: Diagnosis not present

## 2020-02-27 DIAGNOSIS — F419 Anxiety disorder, unspecified: Secondary | ICD-10-CM

## 2020-02-27 DIAGNOSIS — K5909 Other constipation: Secondary | ICD-10-CM

## 2020-02-27 LAB — POCT GLYCOSYLATED HEMOGLOBIN (HGB A1C): Hemoglobin A1C: 6.8 % — AB (ref 4.0–5.6)

## 2020-02-27 LAB — GLUCOSE, POCT (MANUAL RESULT ENTRY): POC Glucose: 127 mg/dL — AB (ref 70–99)

## 2020-02-27 MED ORDER — DULOXETINE HCL 30 MG PO CPEP: 30.0000 mg | ORAL_CAPSULE | Freq: Every day | ORAL | 1 refills | Status: DC

## 2020-02-27 MED ORDER — NOVOLOG FLEXPEN 100 UNIT/ML ~~LOC~~ SOPN: PEN_INJECTOR | SUBCUTANEOUS | 11 refills | Status: DC

## 2020-02-27 MED ORDER — ATORVASTATIN CALCIUM 20 MG PO TABS: 20.0000 mg | ORAL_TABLET | Freq: Every day | ORAL | 3 refills | Status: DC

## 2020-02-27 MED ORDER — TRULICITY 1.5 MG/0.5ML ~~LOC~~ SOAJ: 1.5000 mg | SUBCUTANEOUS | 2 refills | Status: DC

## 2020-02-27 MED ORDER — GABAPENTIN 600 MG PO TABS: 600.0000 mg | ORAL_TABLET | Freq: Three times a day (TID) | ORAL | 0 refills | Status: DC

## 2020-02-27 MED ORDER — LISINOPRIL 5 MG PO TABS: 5.0000 mg | ORAL_TABLET | Freq: Every day | ORAL | 3 refills | Status: DC

## 2020-02-27 MED ORDER — LINACLOTIDE 72 MCG PO CAPS: 72.0000 ug | ORAL_CAPSULE | Freq: Every day | ORAL | 2 refills | Status: DC

## 2020-02-27 NOTE — Progress Notes (Signed)
Patient ID: Denise Hale, female   DOB: Feb 01, 1970, 50 y.o.   MRN: [id number]   Denise Hale, is a 50 y.o. female  BTD:[id number]  [id number]:[id number]  DOB - 07/13/1970  Subjective:  Chief Complaint and HPI: Denise Hale is a 50 y.o. female here today for med RF.  She was started on trulicity a few weeks ago and titrated to higher dose about 3 weeks ago.  Since starting the higher dose of trulicity she has stopped her Levemir for about the last 2 weeks due to hypoglycemia.  She has only had to use SS insulin 1 or 2 times.  Compliant with all meds but has been out of lisinopril for a few days.    ROS:   Constitutional:  No f/c, No night sweats, No unexplained weight loss. EENT:  No vision changes, No blurry vision, No hearing changes. No mouth, throat, or ear problems.  Respiratory: No cough, No SOB Cardiac: No CP, no palpitations GI:  No abd pain, No N/V/D. GU: No Urinary s/sx Musculoskeletal: No joint pain Neuro: No headache, no dizziness, no motor weakness.  Skin: No rash Endocrine:  No polydipsia. No polyuria.  Psych: Denies SI/HI  No problems updated.  ALLERGIES: Allergies  Allergen Reactions  . Ciprofloxacin   . Metformin And Related Diarrhea    PAST MEDICAL HISTORY: Past Medical History:  Diagnosis Date  . Anxiety   . Asthma   . Diabetes mellitus without complication ([place])   . Diverticulitis   . GERD (gastroesophageal reflux disease)     MEDICATIONS AT HOME: Prior to Admission medications   Medication Sig Start Date End Date Taking? Authorizing Provider  Blood Glucose Monitoring Suppl (ONETOUCH VERIO REFLECT) w/Device KIT Check blood glucose level by fingerstick twice per day. Dx: E11.65 11/18/19  Yes Charlott Rakes, MD  glucose blood (ONETOUCH VERIO) test strip Check blood glucose level by fingerstick twice per day. Dx: E11.65 11/18/19  Yes Charlott Rakes, MD  ibuprofen (ADVIL) 600 MG tablet Take 1 tablet (600 mg total) by mouth every 8 (eight) hours as needed. Do not  take with Paxil 11/17/19  Yes Gildardo Pounds, NP  Insulin Pen Needle (B-D UF III MINI PEN NEEDLES) 31G X 5 MM MISC Use as instructed. Inject into the skin four times daily. E11.65 04/15/19  Yes Gildardo Pounds, NP  Lancets [facility] DELICA PLUS [id number]) MISC Check blood glucose level by fingerstick twice per day. Dx: E11.65 11/18/19  Yes Charlott Rakes, MD  TRUEplus Lancets 28G MISC Use as instructed. Check blood glucose level by fingerstick twice per day.   E11.65 04/01/19  Yes Gildardo Pounds, NP  atorvastatin (LIPITOR) 20 MG tablet Take 1 tablet (20 mg total) by mouth daily. 02/27/20   Argentina Donovan, PA-C  Dulaglutide (TRULICITY) 1.5 HW/2.9HB SOPN Inject 1.5 mg into the skin once a week. 02/27/20   Argentina Donovan, PA-C  DULoxetine (CYMBALTA) 30 MG capsule Take 1 capsule (30 mg total) by mouth daily. 02/27/20 05/27/20  Argentina Donovan, PA-C  gabapentin (NEURONTIN) 600 MG tablet Take 1 tablet (600 mg total) by mouth 3 (three) times daily. 02/27/20 03/28/20  Argentina Donovan, PA-C  insulin aspart (NOVOLOG FLEXPEN) 100 UNIT/ML FlexPen For blood sugars 0-150 give 0 units of insulin, 151-200 give 2 units of insulin, 201-250 give 4 units, 251-300 give 6 units, 301-350 give 8 units, 351-400 give 10 units,> 400 give 12 units and call M.D. Discussed hypoglycemia protocol. 02/27/20   Argentina Donovan, PA-C  insulin detemir (  LEVEMIR) 100 UNIT/ML FlexPen Inject 35 Units into the skin 2 (two) times daily. 11/27/19 02/21/20  Charlott Rakes, MD  linaclotide (LINZESS) 72 MCG capsule Take 1 capsule (72 mcg total) by mouth daily before breakfast. 02/27/20   Argentina Donovan, PA-C  lisinopril (ZESTRIL) 5 MG tablet Take 1 tablet (5 mg total) by mouth daily. 02/27/20   Argentina Donovan, PA-C     Objective:  EXAM:   Vitals:   02/27/20 0918  BP: (!) 146/77  Pulse: 76  Temp: 98.7 F (37.1 C)  TempSrc: Oral  SpO2: 98%  Weight: 146 lb (66.2 kg)  Height: '5\' 6"'  (1.676 m)    General appearance : A&OX3.  NAD. Non-toxic-appearing HEENT: Atraumatic and Normocephalic.  PERRLA. EOM intact.  Chest/Lungs:  Breathing-non-labored, Good air entry bilaterally, breath sounds normal without rales, rhonchi, or wheezing  CVS: S1 S2 regular, no murmurs, gallops, rubs  Extremities: Bilateral Lower Ext shows no edema, both legs are warm to touch with = pulse throughout Neurology:  CN II-XII grossly intact, Non focal.   Psych:  TP linear. J/I WNL. Normal speech. Appropriate eye contact and affect.  Skin:  No Rash  Data Review Lab Results  Component Value Date   HGBA1C 6.8 (A) 02/27/2020   HGBA1C 10.7 (A) 10/30/2019   HGBA1C 10.0 (A) 07/02/2019     Assessment & Plan   1. Type 2 diabetes mellitus with diabetic polyneuropathy, with long-term current use of insulin (HCC) Much improving control.  Hold Levimir as her blood sugars have been running under 150 on higher dose trulicity.  Use SS.  cheack blood sugars(see avs) and will add back in Levemir only if needed at lower dosing.   - Glucose (CBG) - HgB A1c - Comprehensive metabolic panel - CBC with Differential/Platelet - Dulaglutide (TRULICITY) 1.5 TM/1.9QQ SOPN; Inject 1.5 mg into the skin once a week.  Dispense: 2 mL; Refill: 2 - gabapentin (NEURONTIN) 600 MG tablet; Take 1 tablet (600 mg total) by mouth 3 (three) times daily.  Dispense: 90 tablet; Refill: 0 - DULoxetine (CYMBALTA) 30 MG capsule; Take 1 capsule (30 mg total) by mouth daily.  Dispense: 90 capsule; Refill: 1 - lisinopril (ZESTRIL) 5 MG tablet; Take 1 tablet (5 mg total) by mouth daily.  Dispense: 90 tablet; Refill: 3 - insulin aspart (NOVOLOG FLEXPEN) 100 UNIT/ML FlexPen; For blood sugars 0-150 give 0 units of insulin, 151-200 give 2 units of insulin, 201-250 give 4 units, 251-300 give 6 units, 301-350 give 8 units, 351-400 give 10 units,> 400 give 12 units and call M.D. Discussed hypoglycemia protocol.  Dispense: 15 mL; Refill: 11  2. Dyslipidemia, goal LDL below 70 - Comprehensive  metabolic panel - atorvastatin (LIPITOR) 20 MG tablet; Take 1 tablet (20 mg total) by mouth daily.  Dispense: 90 tablet; Refill: 3  3. Anxiety and depression stable - DULoxetine (CYMBALTA) 30 MG capsule; Take 1 capsule (30 mg total) by mouth daily.  Dispense: 90 capsule; Refill: 1  4. Chronic constipation - linaclotide (LINZESS) 72 MCG capsule; Take 1 capsule (72 mcg total) by mouth daily before breakfast.  Dispense: 90 capsule; Refill: 2   Patient have been counseled extensively about nutrition and exercise  Return in about 3 weeks (around 03/19/2020) for 3 weeks with Lurena Joiner for DM and 3 months with Zelda.  The patient was given clear instructions to go to ER or return to medical center if symptoms don't improve, worsen or new problems develop. The patient verbalized understanding. The patient was told to  call to get lab results if they haven't heard anything in the next week.     Freeman Caldron, PA-C [facility] [place], [place]   02/27/2020, 11:05 AM

## 2020-02-27 NOTE — Patient Instructions (Signed)
HOLD LEVEMIR FOR NOW.    Check blood sugars fasting and with meals and record.

## 2020-02-28 LAB — COMPREHENSIVE METABOLIC PANEL
ALT: 22 IU/L (ref 0–32)
AST: 17 IU/L (ref 0–40)
Albumin/Globulin Ratio: 1.4 (ref 1.2–2.2)
Albumin: 4.2 g/dL (ref 3.8–4.8)
Alkaline Phosphatase: 90 IU/L (ref 44–121)
BUN/Creatinine Ratio: 12 (ref 9–23)
BUN: 8 mg/dL (ref 6–24)
Bilirubin Total: <0.2 mg/dL (ref 0.0–1.2)
CO2: 24 mmol/L (ref 20–29)
Calcium: 9.2 mg/dL (ref 8.7–10.2)
Chloride: 105 mmol/L (ref 96–106)
Creatinine, Ser: 0.65 mg/dL (ref 0.57–1.00)
GFR calc Af Amer: 120 mL/min/{1.73_m2} (ref >59)
GFR calc non Af Amer: 104 mL/min/{1.73_m2} (ref >59)
Globulin, Total: 2.9 g/dL (ref 1.5–4.5)
Glucose: 117 mg/dL — ABNORMAL HIGH (ref 65–99)
Potassium: 3.8 mmol/L (ref 3.5–5.2)
Sodium: 142 mmol/L (ref 134–144)
Total Protein: 7.1 g/dL (ref 6.0–8.5)

## 2020-02-28 LAB — CBC WITH DIFFERENTIAL/PLATELET
Basophils Absolute: 0 10*3/uL (ref 0.0–0.2)
Basos: 1 %
EOS (ABSOLUTE): 0.2 10*3/uL (ref 0.0–0.4)
Eos: 3 %
Hematocrit: 36.4 % (ref 34.0–46.6)
Hemoglobin: 12 g/dL (ref 11.1–15.9)
Immature Grans (Abs): 0 10*3/uL (ref 0.0–0.1)
Immature Granulocytes: 0 %
Lymphocytes Absolute: 2.3 10*3/uL (ref 0.7–3.1)
Lymphs: 42 %
MCH: 29.8 pg (ref 26.6–33.0)
MCHC: 33 g/dL (ref 31.5–35.7)
MCV: 90 fL (ref 79–97)
Monocytes Absolute: 0.4 10*3/uL (ref 0.1–0.9)
Monocytes: 7 %
Neutrophils Absolute: 2.6 10*3/uL (ref 1.4–7.0)
Neutrophils: 47 %
Platelets: 304 10*3/uL (ref 150–450)
RBC: 4.03 x10E6/uL (ref 3.77–5.28)
RDW: 13.3 % (ref 11.7–15.4)
WBC: 5.5 10*3/uL (ref 3.4–10.8)

## 2020-03-02 ENCOUNTER — Encounter: Payer: Self-pay | Admitting: *Deleted

## 2020-03-11 ENCOUNTER — Telehealth: Payer: Self-pay | Admitting: Physical Medicine and Rehabilitation

## 2020-03-11 NOTE — Telephone Encounter (Signed)
Pt called asking for a CB with some open appt time for Dr. Alvester Morin; she states she may have to reschedule her appt   [phone]

## 2020-03-11 NOTE — Telephone Encounter (Signed)
Returned patient's call. She will keep appointment on 3/11.

## 2020-03-13 ENCOUNTER — Encounter: Payer: Self-pay | Admitting: Physical Medicine and Rehabilitation

## 2020-03-13 ENCOUNTER — Other Ambulatory Visit: Payer: Self-pay

## 2020-03-13 ENCOUNTER — Ambulatory Visit (INDEPENDENT_AMBULATORY_CARE_PROVIDER_SITE_OTHER): Payer: 59 | Admitting: Physical Medicine and Rehabilitation

## 2020-03-13 DIAGNOSIS — R202 Paresthesia of skin: Secondary | ICD-10-CM

## 2020-03-13 NOTE — Progress Notes (Signed)
 .  Numeric Pain Rating Scale and Functional Assessment Average Pain 10   In the last MONTH (on 0-10 scale) has pain interfered with the following?  1. General activity like being  able to carry out your everyday physical activities such as walking, climbing stairs, carrying groceries, or moving a chair?  Rating(7)     

## 2020-03-13 NOTE — Procedures (Signed)
EMG & NCV Findings: Evaluation of the right median (across palm) sensory nerve showed prolonged distal peak latency (Wrist, 4.5 ms) and prolonged distal peak latency (Palm, 3.5 ms).  All remaining nerves (as indicated in the following tables) were within normal limits.    All examined muscles (as indicated in the following table) showed no evidence of electrical instability.    Impression: The above electrodiagnostic study is ABNORMAL and reveals evidence of a mild to moderate median nerve entrapment at the wrist (carpal tunnel syndrome) affecting sensory and motor components.   There is no significant electrodiagnostic evidence of any other focal nerve entrapment, brachial plexopathy, cervical radiculopathy or generalized peripheral neuropathy.   Recommendations: 1.  Follow-up with referring physician. 2.  Continue current management of symptoms. 3.  Suggest surgical evaluation.  ___________________________ Naaman Plummer FAAPMR Board Certified, American Board of Physical Medicine and Rehabilitation    Nerve Conduction Studies Anti Sensory Summary Table   Stim Site NR Peak (ms) Norm Peak (ms) P-T Amp (V) Norm P-T Amp Site1 Site2 Delta-P (ms) Dist (cm) Vel (m/s) Norm Vel (m/s)  Right Median Acr Palm Anti Sensory (2nd Digit)  29.6C  Wrist    *4.5 <3.6 15.1 >10 Wrist Palm 1.0 0.0    Palm    *3.5 <2.0 1.9         Right Radial Anti Sensory (Base 1st Digit)  29.9C  Wrist    2.4 <3.1 29.4  Wrist Base 1st Digit 2.4 0.0    Right Ulnar Anti Sensory (5th Digit)  30C  Wrist    3.7 <3.7 19.8 >15.0 Wrist 5th Digit 3.7 14.0 38 >38   Motor Summary Table   Stim Site NR Onset (ms) Norm Onset (ms) O-P Amp (mV) Norm O-P Amp Site1 Site2 Delta-0 (ms) Dist (cm) Vel (m/s) Norm Vel (m/s)  Right Median Motor (Abd Poll Brev)  30C  Wrist    4.0 <4.2 5.3 >5 Elbow Wrist 3.8 20.0 53 >50  Elbow    7.8  8.1         Right Ulnar Motor (Abd Dig Min)  30C  Wrist    3.3 <4.2 9.9 >3 B Elbow Wrist 3.6 21.5 60  >53  B Elbow    6.9  8.9  A Elbow B Elbow 1.3 10.0 77 >53  A Elbow    8.2  8.5          EMG   Side Muscle Nerve Root Ins Act Fibs Psw Amp Dur Poly Recrt Int Dennie Bible Comment  Right Abd Poll Brev Median C8-T1 Nml Nml Nml Nml Nml 0 Nml Nml   Right 1stDorInt Ulnar C8-T1 Nml Nml Nml Nml Nml 0 Nml Nml   Right PronatorTeres Median C6-7 Nml Nml Nml Nml Nml 0 Nml Nml   Right Biceps Musculocut C5-6 Nml Nml Nml Nml Nml 0 Nml Nml   Right Deltoid Axillary C5-6 Nml Nml Nml Nml Nml 0 Nml Nml     Nerve Conduction Studies Anti Sensory Left/Right Comparison   Stim Site L Lat (ms) R Lat (ms) L-R Lat (ms) L Amp (V) R Amp (V) L-R Amp (%) Site1 Site2 L Vel (m/s) R Vel (m/s) L-R Vel (m/s)  Median Acr Palm Anti Sensory (2nd Digit)  29.6C  Wrist  *4.5   15.1  Wrist Palm     Palm  *3.5   1.9        Radial Anti Sensory (Base 1st Digit)  29.9C  Wrist  2.4   29.4  Wrist  Base 1st Digit     Ulnar Anti Sensory (5th Digit)  30C  Wrist  3.7   19.8  Wrist 5th Digit  38    Motor Left/Right Comparison   Stim Site L Lat (ms) R Lat (ms) L-R Lat (ms) L Amp (mV) R Amp (mV) L-R Amp (%) Site1 Site2 L Vel (m/s) R Vel (m/s) L-R Vel (m/s)  Median Motor (Abd Poll Brev)  30C  Wrist  4.0   5.3  Elbow Wrist  53   Elbow  7.8   8.1        Ulnar Motor (Abd Dig Min)  30C  Wrist  3.3   9.9  B Elbow Wrist  60   B Elbow  6.9   8.9  A Elbow B Elbow  77   A Elbow  8.2   8.5           Waveforms:

## 2020-03-13 NOTE — Progress Notes (Signed)
Denise Hale - 50 y.o. female MRN [id number]  Date of birth: 12-15-1970  Office Visit Note: Visit Date: 03/13/2020 PCP: Claiborne Rigg, NP Referred by: Claiborne Rigg, NP  Subjective: Chief Complaint  Patient presents with  . Right Hand - Pain   HPI:  Denise Hale is a 50 y.o. female who comes in today at the request of Dr. Glee Arvin for electrodiagnostic study of the Right upper extremities.  Patient is Right hand dominant.Throbbing pain in right arm. Trigger finger right fourth finger. Constant pain in right hand. Swelling in right hand. Difficulty sleeping due to the pain.     ROS Otherwise per HPI.  Assessment & Plan: Visit Diagnoses:    ICD-10-CM   1. Paresthesia of skin  R20.2 NCV with EMG (electromyography)    Plan: Impression: The above electrodiagnostic study is ABNORMAL and reveals evidence of a mild to moderate median nerve entrapment at the wrist (carpal tunnel syndrome) affecting sensory and motor components.   There is no significant electrodiagnostic evidence of any other focal nerve entrapment, brachial plexopathy, cervical radiculopathy or generalized peripheral neuropathy.   Recommendations: 1.  Follow-up with referring physician. 2.  Continue current management of symptoms. 3.  Suggest surgical evaluation.  Meds & Orders: No orders of the defined types were placed in this encounter.   Orders Placed This Encounter  Procedures  . NCV with EMG (electromyography)    Follow-up: Return for  Glee Arvin, MD.   Procedures: No procedures performed  EMG & NCV Findings: Evaluation of the right median (across palm) sensory nerve showed prolonged distal peak latency (Wrist, 4.5 ms) and prolonged distal peak latency (Palm, 3.5 ms).  All remaining nerves (as indicated in the following tables) were within normal limits.    All examined muscles (as indicated in the following table) showed no evidence of electrical instability.    Impression: The above  electrodiagnostic study is ABNORMAL and reveals evidence of a mild to moderate median nerve entrapment at the wrist (carpal tunnel syndrome) affecting sensory and motor components.   There is no significant electrodiagnostic evidence of any other focal nerve entrapment, brachial plexopathy, cervical radiculopathy or generalized peripheral neuropathy.   Recommendations: 1.  Follow-up with referring physician. 2.  Continue current management of symptoms. 3.  Suggest surgical evaluation.  ___________________________ Naaman Plummer FAAPMR Board Certified, American Board of Physical Medicine and Rehabilitation    Nerve Conduction Studies Anti Sensory Summary Table   Stim Site NR Peak (ms) Norm Peak (ms) P-T Amp (V) Norm P-T Amp Site1 Site2 Delta-P (ms) Dist (cm) Vel (m/s) Norm Vel (m/s)  Right Median Acr Palm Anti Sensory (2nd Digit)  29.6C  Wrist    *4.5 <3.6 15.1 >10 Wrist Palm 1.0 0.0    Palm    *3.5 <2.0 1.9         Right Radial Anti Sensory (Base 1st Digit)  29.9C  Wrist    2.4 <3.1 29.4  Wrist Base 1st Digit 2.4 0.0    Right Ulnar Anti Sensory (5th Digit)  30C  Wrist    3.7 <3.7 19.8 >15.0 Wrist 5th Digit 3.7 14.0 38 >38   Motor Summary Table   Stim Site NR Onset (ms) Norm Onset (ms) O-P Amp (mV) Norm O-P Amp Site1 Site2 Delta-0 (ms) Dist (cm) Vel (m/s) Norm Vel (m/s)  Right Median Motor (Abd Poll Brev)  30C  Wrist    4.0 <4.2 5.3 >5 Elbow Wrist 3.8 20.0 53 >50  Elbow  7.8  8.1         Right Ulnar Motor (Abd Dig Min)  30C  Wrist    3.3 <4.2 9.9 >3 B Elbow Wrist 3.6 21.5 60 >53  B Elbow    6.9  8.9  A Elbow B Elbow 1.3 10.0 77 >53  A Elbow    8.2  8.5          EMG   Side Muscle Nerve Root Ins Act Fibs Psw Amp Dur Poly Recrt Int Dennie Bible Comment  Right Abd Poll Brev Median C8-T1 Nml Nml Nml Nml Nml 0 Nml Nml   Right 1stDorInt Ulnar C8-T1 Nml Nml Nml Nml Nml 0 Nml Nml   Right PronatorTeres Median C6-7 Nml Nml Nml Nml Nml 0 Nml Nml   Right Biceps Musculocut C5-6 Nml Nml Nml  Nml Nml 0 Nml Nml   Right Deltoid Axillary C5-6 Nml Nml Nml Nml Nml 0 Nml Nml     Nerve Conduction Studies Anti Sensory Left/Right Comparison   Stim Site L Lat (ms) R Lat (ms) L-R Lat (ms) L Amp (V) R Amp (V) L-R Amp (%) Site1 Site2 L Vel (m/s) R Vel (m/s) L-R Vel (m/s)  Median Acr Palm Anti Sensory (2nd Digit)  29.6C  Wrist  *4.5   15.1  Wrist Palm     Palm  *3.5   1.9        Radial Anti Sensory (Base 1st Digit)  29.9C  Wrist  2.4   29.4  Wrist Base 1st Digit     Ulnar Anti Sensory (5th Digit)  30C  Wrist  3.7   19.8  Wrist 5th Digit  38    Motor Left/Right Comparison   Stim Site L Lat (ms) R Lat (ms) L-R Lat (ms) L Amp (mV) R Amp (mV) L-R Amp (%) Site1 Site2 L Vel (m/s) R Vel (m/s) L-R Vel (m/s)  Median Motor (Abd Poll Brev)  30C  Wrist  4.0   5.3  Elbow Wrist  53   Elbow  7.8   8.1        Ulnar Motor (Abd Dig Min)  30C  Wrist  3.3   9.9  B Elbow Wrist  60   B Elbow  6.9   8.9  A Elbow B Elbow  77   A Elbow  8.2   8.5           Waveforms:             Clinical History: No specialty comments available.     Objective:  VS:  HT:    WT:   BMI:     BP:   HR: bpm  TEMP: ( )  RESP:  Physical Exam Musculoskeletal:        General: No swelling, tenderness or deformity.     Comments: Inspection reveals no atrophy of the bilateral APB or FDI or hand intrinsics. There is no swelling, color changes, allodynia or dystrophic changes. There is 5 out of 5 strength in the bilateral wrist extension, finger abduction and long finger flexion. There is intact sensation to light touch in all dermatomal and peripheral nerve distributions.  There is a negative Hoffmann's test bilaterally.  Skin:    General: Skin is warm and dry.     Findings: No erythema or rash.  Neurological:     General: No focal deficit present.     Mental Status: She is alert and oriented to person, place, and time.  Motor: No weakness or abnormal muscle tone.     Coordination: Coordination normal.   Psychiatric:        Mood and Affect: Mood normal.        Behavior: Behavior normal.      Imaging: No results found.

## 2020-03-20 ENCOUNTER — Other Ambulatory Visit: Payer: Self-pay

## 2020-03-20 ENCOUNTER — Ambulatory Visit: Payer: 59 | Attending: Nurse Practitioner | Admitting: Pharmacist

## 2020-03-20 ENCOUNTER — Encounter: Payer: Self-pay | Admitting: Pharmacist

## 2020-03-20 DIAGNOSIS — Z794 Long term (current) use of insulin: Secondary | ICD-10-CM | POA: Diagnosis not present

## 2020-03-20 DIAGNOSIS — E1142 Type 2 diabetes mellitus with diabetic polyneuropathy: Secondary | ICD-10-CM

## 2020-03-20 NOTE — Progress Notes (Signed)
S:    PCP: Bertram Denver  Patient arrives in good spirits.  Presents for diabetes evaluation, education, and management. Patient was referred on 02/27/2020 by Marylene Land. At the visit, patient's A1c result showed improved glycemic control (6.8).  She was asked to hold Levemir and continue with Trulicity and SSI only.   Today, she reports good control with Trulicity only. She has not required any SSI and has continued to hold the Levemir. She cannot tolerate metformin d/t diarrhea.   Patient reports Diabetes was diagnosed ~14-15 years ago. She has undergone a cholecystectomy and colon surgery but has never had pancreatitis. I have no record of DKA in her chart. Pt reports that she does not remember being hospitalized for DKA before. Denies MI, stroke, CHF, or CKD hx. Denies fhx or personal hx of thyroid cancer.    Family/Social History:  - FHx: MGM, mother (diabetes) - Tobacco: former smoker (quit 20 yrs ago) - Alcohol: denies use   Insurance coverage/medication affordability: [employer]  Medication adherence reported.   Current diabetes medications include: Trulicity 1.5 mg weekly, Levemir (not using), Novolog SSI (not using) Current hypertension medications include: lisinopril 5 mg daily  Current hyperlipidemia medications include: atorvastatin 20 mg daily   Patient denies hypoglycemic events.  Patient reported dietary habits: Eats 1-2 meals/day - Reports that she has never had a real appetite but attributes this to working in [employer]  - She admits that she does not drink enough water  - Rarely indulges in sweets   Patient-reported exercise habits:  - Mainly through work    Patient denies nocturia (nighttime urination).  Patient reports neuropathy (nerve pain). Patient denies visual changes. Patient reports self foot exams.     O:  Lab Results  Component Value Date   HGBA1C 6.8 (A) 02/27/2020   There were no vitals filed for this visit.  Lipid Panel      Component Value Date/Time   CHOL 144 10/30/2019 1433   TRIG 80 10/30/2019 1433   HDL 71 10/30/2019 1433   CHOLHDL 2.0 10/30/2019 1433   LDLCALC 58 10/30/2019 1433    Home fasting blood sugars: 110s-120s reported. Does not have her meter. Denies readings >130  2 hour post-meal/random blood sugars: does not check.   Clinical Atherosclerotic Cardiovascular Disease (ASCVD): No  The 10-year ASCVD risk score Denman George DC Jr., et al., 2013) is: 4%   Values used to calculate the score:     Age: 50 years     Sex: Female     Is Non-Hispanic African American: Yes     Diabetic: Yes     Tobacco smoker: No     Systolic Blood Pressure: 146 mmHg     Is BP treated: No     HDL Cholesterol: 71 mg/dL     Total Cholesterol: 144 mg/dL    A/P: Diabetes longstanding currently controlled. Patient is able to verbalize appropriate hypoglycemia management plan. Medication adherence appears appropriate. Will dc Levemir and Novolog SSI today and continue with Trulicity only. -Discontinued Novolog, Levemir. -Continued Trulicity 1.5 mg weekly.  -Extensively discussed pathophysiology of diabetes, recommended lifestyle interventions, dietary effects on blood sugar control -Counseled on s/sx of and management of hypoglycemia -Next A1C anticipated 05/2020.   ASCVD risk - primary prevention in patient with diabetes. Last LDL is controlled. ASCVD risk score is not >20%  - moderate intensity statin indicated.  -Continued atorvastatin 20 mg.   Written patient instructions provided.  Total time in face to face  counseling 30 minutes.   Follow up PCP Clinic Visit in June. She may follow-up with me prn before that visit.   Butch Penny, PharmD, Patsy [place], CPP Clinical Pharmacist [facility] & [facility] [phone]

## 2020-03-26 ENCOUNTER — Encounter: Payer: Self-pay | Admitting: Orthopaedic Surgery

## 2020-03-26 ENCOUNTER — Other Ambulatory Visit: Payer: Self-pay

## 2020-03-26 ENCOUNTER — Ambulatory Visit (INDEPENDENT_AMBULATORY_CARE_PROVIDER_SITE_OTHER): Payer: 59 | Admitting: Orthopaedic Surgery

## 2020-03-26 ENCOUNTER — Telehealth: Payer: Self-pay | Admitting: Orthopaedic Surgery

## 2020-03-26 VITALS — Ht 66.0 in | Wt 146.0 lb

## 2020-03-26 DIAGNOSIS — G5601 Carpal tunnel syndrome, right upper limb: Secondary | ICD-10-CM

## 2020-03-26 NOTE — Telephone Encounter (Signed)
Patient is scheduled for right carpal tunnel release on 04-27-20 with Dr. Roda Shutters at [facility].  She would like to get an idea of her down time and how long she will be out of work so she can let her employer know in advance.    Please call patient's cell.  [phone]

## 2020-03-26 NOTE — Telephone Encounter (Signed)
2-4 weeks

## 2020-03-26 NOTE — Progress Notes (Signed)
Office Visit Note   Patient: Denise Hale           Date of Birth: 07/21/1970           MRN: [id number] Visit Date: 03/26/2020              Requested by: Denise Pounds, NP [place],  [place] [id number] PCP: Denise Pounds, NP   Assessment & Plan: Visit Diagnoses:  1. Right carpal tunnel syndrome     Plan: Impression is right moderate carpal tunnel syndrome.  Nerve conduction studies reviewed with the patient in detail.  Thus far she has not gotten any relief from nighttime splinting and prior carpal tunnel injection only gave her about 3 weeks of relief.  Her diabetes is under much better control with recent A1c of 6.8.  At this point after consideration of her treatment options she is elected to proceed with a right carpal tunnel release soon as possible.  Details of the surgery including risk benefits rehab recovery discussed with the patient in detail.  Plan is to do this under local and MAC in the operating room theater.  Questions encouraged and answered.  Follow-Up Instructions: Return if symptoms worsen or fail to improve.   Orders:  No orders of the defined types were placed in this encounter.  No orders of the defined types were placed in this encounter.     Procedures: No procedures performed   Clinical Data: No additional findings.   Subjective: Chief Complaint  Patient presents with  . Right Wrist - Follow-up    EMG review    Denise Hale returns today for nerve conduction studies for carpal tunnel syndrome.  These revealed mild to moderate of the right hand.   Review of Systems  Constitutional: Negative.   HENT: Negative.   Eyes: Negative.   Respiratory: Negative.   Cardiovascular: Negative.   Endocrine: Negative.   Musculoskeletal: Negative.   Neurological: Negative.   Hematological: Negative.   Psychiatric/Behavioral: Negative.   All other systems reviewed and are negative.    Objective: Vital Signs: Ht _0  (1.676 m)   Wt  146 lb (66.2 kg)   BMI 23.57 kg/m   Physical Exam Vitals and nursing note reviewed.  Constitutional:      Appearance: She is well-developed.  Pulmonary:     Effort: Pulmonary effort is normal.  Skin:    General: Skin is warm.     Capillary Refill: Capillary refill takes less than 2 seconds.  Neurological:     Mental Status: She is alert and oriented to person, place, and time.  Psychiatric:        Behavior: Behavior normal.        Thought Content: Thought content normal.        Judgment: Judgment normal.     Ortho Exam Right hand exam is unchanged. Specialty Comments:  No specialty comments available.  Imaging: No results found.   PMFS History: Patient Active Problem List   Diagnosis Date Noted  . DM2 (diabetes mellitus, type 2) ([place]) 10/30/2019  . Influenza vaccine needed 10/30/2019   Past Medical History:  Diagnosis Date  . Anxiety   . Asthma   . Diabetes mellitus without complication ([place])   . Diverticulitis   . GERD (gastroesophageal reflux disease)     Family History  Problem Relation Age of Onset  . Diabetes Mother   . Thyroid disease Mother   . Heart disease Father   . Breast  cancer Paternal Aunt   . Breast cancer Paternal Aunt     Past Surgical History:  Procedure Laterality Date  . CHOLECYSTECTOMY    . COLON SURGERY     Social History   Occupational History  . Not on file  Tobacco Use  . Smoking status: Former Research scientist (life sciences)  . Smokeless tobacco: Never Used  Vaping Use  . Vaping Use: Never used  Substance and Sexual Activity  . Alcohol use: Not Currently    Comment: occasionally  . Drug use: Yes    Types: Marijuana  . Sexual activity: Yes    Birth control/protection: None

## 2020-03-27 NOTE — Telephone Encounter (Signed)
Patient aware.

## 2020-04-01 ENCOUNTER — Encounter (HOSPITAL_BASED_OUTPATIENT_CLINIC_OR_DEPARTMENT_OTHER): Payer: Self-pay | Admitting: Orthopaedic Surgery

## 2020-04-01 ENCOUNTER — Other Ambulatory Visit: Payer: Self-pay

## 2020-04-01 ENCOUNTER — Telehealth: Payer: Self-pay

## 2020-04-01 NOTE — Telephone Encounter (Signed)
Patient called she is requesting a rx for pain until she has surgery she stated the gabapentin isn't touching the pain patient stated she cant use her hand call back:[phone]

## 2020-04-02 ENCOUNTER — Other Ambulatory Visit: Payer: Self-pay | Admitting: Physician Assistant

## 2020-04-02 MED ORDER — TRAMADOL HCL 50 MG PO TABS: 50.0000 mg | ORAL_TABLET | Freq: Three times a day (TID) | ORAL | 0 refills | Status: DC | PRN

## 2020-04-02 NOTE — Telephone Encounter (Signed)
Lvm informing pt.

## 2020-04-02 NOTE — Telephone Encounter (Signed)
Sent in tramadol

## 2020-04-06 ENCOUNTER — Other Ambulatory Visit (HOSPITAL_COMMUNITY)
Admission: RE | Admit: 2020-04-06 | Discharge: 2020-04-06 | Disposition: A | Discharge status: Home / Self Care | Payer: 59 | Source: Ambulatory Visit | Attending: Orthopaedic Surgery | Admitting: Orthopaedic Surgery

## 2020-04-06 ENCOUNTER — Encounter (HOSPITAL_BASED_OUTPATIENT_CLINIC_OR_DEPARTMENT_OTHER)
Admission: RE | Admit: 2020-04-06 | Discharge: 2020-04-06 | Disposition: A | Discharge status: Home / Self Care | Payer: 59 | Source: Ambulatory Visit | Attending: Orthopaedic Surgery | Admitting: Orthopaedic Surgery

## 2020-04-06 DIAGNOSIS — Z01812 Encounter for preprocedural laboratory examination: Secondary | ICD-10-CM | POA: Insufficient documentation

## 2020-04-06 DIAGNOSIS — Z20822 Contact with and (suspected) exposure to covid-19: Secondary | ICD-10-CM | POA: Insufficient documentation

## 2020-04-06 LAB — BASIC METABOLIC PANEL
Anion gap: 5 (ref 5–15)
BUN: 7 mg/dL (ref 6–20)
CO2: 31 mmol/L (ref 22–32)
Calcium: 9.3 mg/dL (ref 8.9–10.3)
Chloride: 103 mmol/L (ref 98–111)
Creatinine, Ser: 0.53 mg/dL (ref 0.44–1.00)
GFR, Estimated: >60 mL/min (ref >60)
Glucose, Bld: 128 mg/dL — ABNORMAL HIGH (ref 70–99)
Potassium: 3.9 mmol/L (ref 3.5–5.1)
Sodium: 139 mmol/L (ref 135–145)

## 2020-04-06 LAB — SARS CORONAVIRUS 2 (TAT 6-24 HRS): SARS Coronavirus 2: NEGATIVE

## 2020-04-06 LAB — POCT PREGNANCY, URINE: Preg Test, Ur: NEGATIVE

## 2020-04-06 NOTE — Progress Notes (Signed)

## 2020-04-07 DIAGNOSIS — G5601 Carpal tunnel syndrome, right upper limb: Secondary | ICD-10-CM

## 2020-04-08 ENCOUNTER — Ambulatory Visit (HOSPITAL_BASED_OUTPATIENT_CLINIC_OR_DEPARTMENT_OTHER): Payer: 59 | Admitting: Anesthesiology

## 2020-04-08 ENCOUNTER — Encounter (HOSPITAL_BASED_OUTPATIENT_CLINIC_OR_DEPARTMENT_OTHER): Payer: Self-pay | Admitting: Orthopaedic Surgery

## 2020-04-08 ENCOUNTER — Ambulatory Visit (HOSPITAL_BASED_OUTPATIENT_CLINIC_OR_DEPARTMENT_OTHER)
Admission: RE | Admit: 2020-04-08 | Discharge: 2020-04-08 | Disposition: A | Discharge status: Home / Self Care | Payer: 59 | Attending: Orthopaedic Surgery | Admitting: Orthopaedic Surgery

## 2020-04-08 ENCOUNTER — Other Ambulatory Visit: Payer: Self-pay

## 2020-04-08 ENCOUNTER — Encounter (HOSPITAL_BASED_OUTPATIENT_CLINIC_OR_DEPARTMENT_OTHER)
Admission: RE | Disposition: A | Discharge status: Home / Self Care | Payer: Self-pay | Source: Home / Self Care | Attending: Orthopaedic Surgery

## 2020-04-08 DIAGNOSIS — Z87891 Personal history of nicotine dependence: Secondary | ICD-10-CM | POA: Insufficient documentation

## 2020-04-08 DIAGNOSIS — Z8349 Family history of other endocrine, nutritional and metabolic diseases: Secondary | ICD-10-CM | POA: Insufficient documentation

## 2020-04-08 DIAGNOSIS — Z8249 Family history of ischemic heart disease and other diseases of the circulatory system: Secondary | ICD-10-CM | POA: Diagnosis not present

## 2020-04-08 DIAGNOSIS — Z803 Family history of malignant neoplasm of breast: Secondary | ICD-10-CM | POA: Insufficient documentation

## 2020-04-08 DIAGNOSIS — Z888 Allergy status to other drugs, medicaments and biological substances status: Secondary | ICD-10-CM | POA: Diagnosis not present

## 2020-04-08 DIAGNOSIS — Z833 Family history of diabetes mellitus: Secondary | ICD-10-CM | POA: Insufficient documentation

## 2020-04-08 DIAGNOSIS — G5601 Carpal tunnel syndrome, right upper limb: Secondary | ICD-10-CM

## 2020-04-08 DIAGNOSIS — Z79899 Other long term (current) drug therapy: Secondary | ICD-10-CM | POA: Diagnosis not present

## 2020-04-08 DIAGNOSIS — Z881 Allergy status to other antibiotic agents status: Secondary | ICD-10-CM | POA: Insufficient documentation

## 2020-04-08 DIAGNOSIS — Z794 Long term (current) use of insulin: Secondary | ICD-10-CM | POA: Insufficient documentation

## 2020-04-08 HISTORY — PX: CARPAL TUNNEL RELEASE: SHX101

## 2020-04-08 LAB — GLUCOSE, CAPILLARY: Glucose-Capillary: 126 mg/dL — ABNORMAL HIGH (ref 70–99)

## 2020-04-08 SURGERY — CARPAL TUNNEL RELEASE
Anesthesia: Monitor Anesthesia Care | Site: Wrist | Laterality: Right

## 2020-04-08 MED ORDER — LIDOCAINE HCL (PF) 1 % IJ SOLN
INTRAMUSCULAR | Status: AC
  Filled 2020-04-08: qty 30

## 2020-04-08 MED ORDER — ACETAMINOPHEN 500 MG PO TABS
1000.0000 mg | ORAL_TABLET | Freq: Once | ORAL | Status: AC
  Administered 2020-04-08: 1000 mg via ORAL

## 2020-04-08 MED ORDER — CEFAZOLIN SODIUM-DEXTROSE 2-4 GM/100ML-% IV SOLN
INTRAVENOUS | Status: AC
  Filled 2020-04-08: qty 100

## 2020-04-08 MED ORDER — ACETAMINOPHEN 500 MG PO TABS
ORAL_TABLET | ORAL | Status: AC
  Filled 2020-04-08: qty 2

## 2020-04-08 MED ORDER — FENTANYL CITRATE (PF) 100 MCG/2ML IJ SOLN
INTRAMUSCULAR | Status: DC | PRN
  Administered 2020-04-08 (×2): 25 ug via INTRAVENOUS

## 2020-04-08 MED ORDER — LIDOCAINE 2% (20 MG/ML) 5 ML SYRINGE
INTRAMUSCULAR | Status: DC | PRN
  Administered 2020-04-08: 40 mg via INTRAVENOUS

## 2020-04-08 MED ORDER — PROMETHAZINE HCL 25 MG/ML IJ SOLN: 6.2500 mg | INTRAMUSCULAR | Status: DC | PRN

## 2020-04-08 MED ORDER — HYDROCODONE-ACETAMINOPHEN 5-325 MG PO TABS: 1.0000 | ORAL_TABLET | Freq: Four times a day (QID) | ORAL | 0 refills | Status: DC | PRN

## 2020-04-08 MED ORDER — ONDANSETRON HCL 4 MG/2ML IJ SOLN
INTRAMUSCULAR | Status: DC | PRN
  Administered 2020-04-08: 4 mg via INTRAVENOUS

## 2020-04-08 MED ORDER — LIDOCAINE-EPINEPHRINE 1 %-1:100000 IJ SOLN
INTRAMUSCULAR | Status: AC
  Filled 2020-04-08: qty 1

## 2020-04-08 MED ORDER — BUPIVACAINE HCL (PF) 0.25 % IJ SOLN
INTRAMUSCULAR | Status: DC | PRN
  Administered 2020-04-08: 9 mL

## 2020-04-08 MED ORDER — MIDAZOLAM HCL 5 MG/5ML IJ SOLN
INTRAMUSCULAR | Status: DC | PRN
  Administered 2020-04-08: 2 mg via INTRAVENOUS

## 2020-04-08 MED ORDER — FENTANYL CITRATE (PF) 100 MCG/2ML IJ SOLN: 25.0000 ug | INTRAMUSCULAR | Status: DC | PRN

## 2020-04-08 MED ORDER — PROPOFOL 500 MG/50ML IV EMUL
INTRAVENOUS | Status: DC | PRN
  Administered 2020-04-08: 20 mg via INTRAVENOUS
  Administered 2020-04-08: 100 ug/kg/min via INTRAVENOUS

## 2020-04-08 MED ORDER — MIDAZOLAM HCL 2 MG/2ML IJ SOLN
INTRAMUSCULAR | Status: AC
  Filled 2020-04-08: qty 2

## 2020-04-08 MED ORDER — LACTATED RINGERS IV SOLN: INTRAVENOUS | Status: DC

## 2020-04-08 MED ORDER — FENTANYL CITRATE (PF) 100 MCG/2ML IJ SOLN
INTRAMUSCULAR | Status: AC
  Filled 2020-04-08: qty 2

## 2020-04-08 MED ORDER — CEFAZOLIN SODIUM-DEXTROSE 2-4 GM/100ML-% IV SOLN
2.0000 g | INTRAVENOUS | Status: AC
  Administered 2020-04-08: 2 g via INTRAVENOUS

## 2020-04-08 SURGICAL SUPPLY — 47 items
BAND INSRT 18 STRL LF DISP RB (MISCELLANEOUS) ×2
BAND RUBBER #18 3X1/16 STRL (MISCELLANEOUS) ×6 IMPLANT
BLADE MINI RND TIP GREEN BEAV (BLADE) ×3 IMPLANT
BLADE SURG 15 STRL LF DISP TIS (BLADE) ×1 IMPLANT
BLADE SURG 15 STRL SS (BLADE) ×3
BNDG CMPR 9X4 STRL LF SNTH (GAUZE/BANDAGES/DRESSINGS) ×1
BNDG ELASTIC 3X5.8 VLCR STR LF (GAUZE/BANDAGES/DRESSINGS) ×3 IMPLANT
BNDG ESMARK 4X9 LF (GAUZE/BANDAGES/DRESSINGS) ×3 IMPLANT
BNDG PLASTER X FAST 3X3 WHT LF (CAST SUPPLIES) IMPLANT
BNDG PLSTR 9X3 FST ST WHT (CAST SUPPLIES)
BRUSH SCRUB EZ PLAIN DRY (MISCELLANEOUS) ×3 IMPLANT
CANISTER SUCT 1200ML W/VALVE (MISCELLANEOUS) ×3 IMPLANT
CORD BIPOLAR FORCEPS 12FT (ELECTRODE) ×3 IMPLANT
COVER BACK TABLE 60X90IN (DRAPES) ×3 IMPLANT
COVER MAYO STAND STRL (DRAPES) ×3 IMPLANT
COVER WAND RF STERILE (DRAPES) IMPLANT
CUFF TOURN SGL QUICK 18X4 (TOURNIQUET CUFF) IMPLANT
DECANTER SPIKE VIAL GLASS SM (MISCELLANEOUS) IMPLANT
DRAPE EXTREMITY T 121X128X90 (DISPOSABLE) ×3 IMPLANT
DRAPE IMP U-DRAPE 54X76 (DRAPES) ×3 IMPLANT
DRAPE SURG 17X23 STRL (DRAPES) ×3 IMPLANT
GAUZE 4X4 16PLY RFD (DISPOSABLE) IMPLANT
GAUZE SPONGE 4X4 12PLY STRL (GAUZE/BANDAGES/DRESSINGS) ×3 IMPLANT
GAUZE XEROFORM 1X8 LF (GAUZE/BANDAGES/DRESSINGS) ×3 IMPLANT
GLOVE SURG LTX SZ7 (GLOVE) ×3 IMPLANT
GLOVE SURG NEOP MICRO LF SZ7.5 (GLOVE) ×3 IMPLANT
GLOVE SURG SYN 7.5  E (GLOVE) ×2
GLOVE SURG SYN 7.5 E (GLOVE) ×1 IMPLANT
GLOVE SURG UNDER POLY LF SZ7 (GLOVE) ×3 IMPLANT
GOWN STRL REIN XL XLG (GOWN DISPOSABLE) ×3 IMPLANT
GOWN STRL REUS W/ TWL XL LVL3 (GOWN DISPOSABLE) ×2 IMPLANT
GOWN STRL REUS W/TWL XL LVL3 (GOWN DISPOSABLE) ×6
NEEDLE HYPO 25X1 1.5 SAFETY (NEEDLE) IMPLANT
NS IRRIG 1000ML POUR BTL (IV SOLUTION) ×3 IMPLANT
PACK BASIN DAY SURGERY FS (CUSTOM PROCEDURE TRAY) ×3 IMPLANT
PAD CAST 3X4 CTTN HI CHSV (CAST SUPPLIES) ×1 IMPLANT
PADDING CAST COTTON 3X4 STRL (CAST SUPPLIES) ×3
SHEET MEDIUM DRAPE 40X70 STRL (DRAPES) ×3 IMPLANT
STOCKINETTE 4X48 STRL (DRAPES) ×3 IMPLANT
SUT ETHILON 3 0 PS 1 (SUTURE) ×3 IMPLANT
SYR BULB EAR ULCER 3OZ GRN STR (SYRINGE) ×3 IMPLANT
SYR CONTROL 10ML LL (SYRINGE) IMPLANT
TOWEL GREEN STERILE FF (TOWEL DISPOSABLE) ×3 IMPLANT
TRAY DSU PREP LF (CUSTOM PROCEDURE TRAY) ×3 IMPLANT
TUBE CONNECTING 20'X1/4 (TUBING)
TUBE CONNECTING 20X1/4 (TUBING) IMPLANT
UNDERPAD 30X36 HEAVY ABSORB (UNDERPADS AND DIAPERS) ×3 IMPLANT

## 2020-04-08 NOTE — Anesthesia Preprocedure Evaluation (Addendum)
Anesthesia Evaluation  Patient identified by MRN, date of birth, ID band Patient awake    Reviewed: Allergy & Precautions, NPO status , Patient's Chart, lab work & pertinent test results  Airway Mallampati: II  TM Distance: >3 FB Neck ROM: Full    Dental  (+) Dental Advisory Given, Missing, Poor Dentition   Pulmonary asthma , former smoker,    Pulmonary exam normal breath sounds clear to auscultation       Cardiovascular negative cardio ROS Normal cardiovascular exam Rhythm:Regular Rate:Normal     Neuro/Psych PSYCHIATRIC DISORDERS Anxiety negative neurological ROS     GI/Hepatic GERD  ,(+)     substance abuse  marijuana use,   Endo/Other  diabetes, Type 2, Insulin Dependent  Renal/GU negative Renal ROS     Musculoskeletal RCTS   Abdominal   Peds  Hematology negative hematology ROS (+)   Anesthesia Other Findings Day of surgery medications reviewed with the patient.  Reproductive/Obstetrics                            Anesthesia Physical Anesthesia Plan  ASA: II  Anesthesia Plan: MAC   Post-op Pain Management:    Induction: Intravenous  PONV Risk Score and Plan: 2 and Propofol infusion, Treatment may vary due to age or medical condition and Midazolam  Airway Management Planned: Nasal Cannula and Natural Airway  Additional Equipment:   Intra-op Plan:   Post-operative Plan:   Informed Consent: I have reviewed the patients History and Physical, chart, labs and discussed the procedure including the risks, benefits and alternatives for the proposed anesthesia with the patient or authorized representative who has indicated his/her understanding and acceptance.     Dental advisory given  Plan Discussed with: CRNA and Anesthesiologist  Anesthesia Plan Comments:         Anesthesia Quick Evaluation

## 2020-04-08 NOTE — Discharge Instructions (Signed)
Postoperative instructions:  Dressing instructions: Keep your dressing and/or splint clean and dry at all times.  It will be removed at your first post-operative appointment.  Your stitches and/or staples will be removed at this visit.  Incision instructions:  Do not soak your incision for 3 weeks after surgery.  If the incision gets wet, pat dry and do not scrub the incision.  Pain control:  You have been given a prescription to be taken as directed for post-operative pain control.  In addition, elevate the operative extremity above the heart at all times to prevent swelling and throbbing pain.  Take over-the-counter Colace, 100mg  by mouth twice a day while taking narcotic pain medications to help prevent constipation.  Follow up appointments: 1) 7 days for wound check. 2) Dr. as scheduled.   -------------------------------------------------------------------------------------------------------------  After Surgery Pain Control:  After your surgery, post-surgical discomfort or pain is likely. This discomfort can last several days to a few weeks. At certain times of the day your discomfort may be more intense.  Did you receive a nerve block?  A nerve block can provide pain relief for one hour to two days after your surgery. As long as the nerve block is working, you will experience little or no sensation in the area the surgeon operated on.  As the nerve block wears off, you will begin to experience pain or discomfort. It is very important that you begin taking your prescribed pain medication before the nerve block fully wears off. Treating your pain at the first sign of the block wearing off will ensure your pain is better controlled and more tolerable when full-sensation returns. Do not wait until the pain is intolerable, as the medicine will be less effective. It is better to treat pain in advance than to try and catch up.  General Anesthesia:  If you did not receive a nerve block  during your surgery, you will need to start taking your pain medication shortly after your surgery and should continue to do so as prescribed by your surgeon.  Pain Medication:  Most commonly we prescribe Vicodin and Percocet for post-operative pain. Both of these medications contain a combination of acetaminophen (Tylenol) and a narcotic to help control pain.   It takes between 30 and 45 minutes before pain medication starts to work. It is important to take your medication before your pain level gets too intense.   Nausea is a common side effect of many pain medications. You will want to eat something before taking your pain medicine to help prevent nausea.   If you are taking a prescription pain medication that contains acetaminophen, we recommend that you do not take additional over the counter acetaminophen (Tylenol).  Other pain relieving options:   Using a cold pack to ice the affected area a few times a day (15 to 20 minutes at a time) can help to relieve pain, reduce swelling and bruising.   Elevation of the affected area can also help to reduce pain and swelling.   Post Anesthesia Home Care Instructions  Activity: Get plenty of rest for the remainder of the day. A responsible individual must stay with you for 24 hours following the procedure.  For the next 24 hours, DO NOT: -Drive a car -Roda Shutters -Drink alcoholic beverages -Take any medication unless instructed by your physician -Make any legal decisions or sign important papers.  Meals: Start with liquid foods such as gelatin or soup. Progress to regular foods as tolerated. Avoid greasy,  spicy, heavy foods. If nausea and/or vomiting occur, drink only clear liquids until the nausea and/or vomiting subsides. Call your physician if vomiting continues.  Special Instructions/Symptoms: Your throat may feel dry or sore from the anesthesia or the breathing tube placed in your throat during surgery. If this causes  discomfort, gargle with warm salt water. The discomfort should disappear within 24 hours.  If you had a scopolamine patch placed behind your ear for the management of post- operative nausea and/or vomiting:  1. The medication in the patch is effective for 72 hours, after which it should be removed.  Wrap patch in a tissue and discard in the trash. Wash hands thoroughly with soap and water. 2. You may remove the patch earlier than 72 hours if you experience unpleasant side effects which may include dry mouth, dizziness or visual disturbances. 3. Avoid touching the patch. Wash your hands with soap and water after contact with the patch.    No Tylenol until 5:21 pm

## 2020-04-08 NOTE — Op Note (Signed)
   Carpal tunnel op note  DATE OF SURGERY:04/08/2020  PREOPERATIVE DIAGNOSIS:  Right carpal tunnel syndrome  POSTOPERATIVE DIAGNOSIS: same  PROCEDURE: Right carpal tunnel release. CPT 26333  SURGEON: Marianna Payment, M.D.  ASSIST: Madalyn Rob, [place]  ANESTHESIA:  Local and MAC  TOURNIQUET TIME: less than 20 minutes  BLOOD LOSS: Minimal.  COMPLICATIONS: None.  PATHOLOGY: None.  INDICATIONS: The patient is a 50 y.o. -year-old female who presented with carpal tunnel syndrome failing nonsurgical management, indicated for surgical release.  DESCRIPTION OF PROCEDURE: The patient was identified in the preoperative holding area.  The operative site was marked by the surgeon and confirmed by the patient.  She was brought back to the operating room.  Local anesthetic with epi was injected into the operative site.  Anesthesia was induced by the anesthesia team.  A well padded nonsterile tourniquet was placed. The operative extremity was prepped and draped in standard sterile fashion.  A timeout was performed.  Preoperative antibiotics were given.   A palmar incision was made about 5 mm ulnar to the thenar crease.  The palmar aponeurosis was exposed and divided in line with the skin incision. The palmaris brevis was visualized and divided.  The distal edge of the transcarpal ligament was identified. A hemostat was inserted into the carpal tunnel to protect the median nerve and the flexor tendons. Then, the transverse carpal ligament was released under direct visualization. Proximally, a subcutaneous tunnel was made allowing a Sewell retractor to be placed. Then, the distal portion of the antebrachial fascia was released. Distally, all fibrous bands were released. The median nerve was visualized, and the fat pad was exposed. Wound was irrigated and closed with 4-0 nylon sutures. Sterile dressing applied. The patient was transferred to the recovery room in stable condition after all counts  were correct.  POSTOPERATIVE PLAN: To start nerve gliding exercises as tolerated and no heavy lifting for four weeks.  Eduard Roux, M.D. [facility] 12:34 PM

## 2020-04-08 NOTE — Transfer of Care (Signed)
Immediate Anesthesia Transfer of Care Note  Patient: Denise Hale  Procedure(s) Performed: RIGHT CARPAL TUNNEL RELEASE (Right Wrist)  Patient Location: PACU  Anesthesia Type:MAC  Level of Consciousness: drowsy  Airway & Oxygen Therapy: Patient Spontanous Breathing and Patient connected to face mask oxygen  Post-op Assessment: Report given to RN and Post -op Vital signs reviewed and stable  Post vital signs: Reviewed and stable  Last Vitals:  Vitals Value Taken Time  BP    Temp    Pulse 67 04/08/20 1247  Resp 16 04/08/20 1247  SpO2 100 % 04/08/20 1247  Vitals shown include unvalidated device data.  Last Pain:  Vitals:   04/08/20 1115  TempSrc: Oral  PainSc: 8       Patients Stated Pain Goal: 5 (47/84/12 8208)  Complications: No complications documented.

## 2020-04-08 NOTE — H&P (Signed)
PREOPERATIVE H&P  Chief Complaint: right carpal tunnel syndrome  HPI: Denise Hale is a 50 y.o. female who presents for surgical treatment of right carpal tunnel syndrome.  She denies any changes in medical history.  Past Medical History:  Diagnosis Date  . Anxiety   . Asthma   . Diabetes mellitus without complication ([place])   . Diverticulitis   . GERD (gastroesophageal reflux disease)    Past Surgical History:  Procedure Laterality Date  . CHOLECYSTECTOMY    . COLON SURGERY     Social History   Socioeconomic History  . Marital status: Unknown    Spouse name: Not on file  . Number of children: 2  . Years of education: Not on file  . Highest education level: Some college, no degree  Occupational History  . Not on file  Tobacco Use  . Smoking status: Former Research scientist (life sciences)  . Smokeless tobacco: Never Used  Vaping Use  . Vaping Use: Never used  Substance and Sexual Activity  . Alcohol use: Not Currently    Comment: occasionally  . Drug use: Yes    Types: Marijuana  . Sexual activity: Yes    Birth control/protection: None  Other Topics Concern  . Not on file  Social History Narrative  . Not on file   Social Determinants of Health   Financial Resource Strain: Not on file  Food Insecurity: Not on file  Transportation Needs: No Transportation Needs  . Lack of Transportation (Medical): No  . Lack of Transportation (Non-Medical): No  Physical Activity: Not on file  Stress: Not on file  Social Connections: Not on file   Family History  Problem Relation Age of Onset  . Diabetes Mother   . Thyroid disease Mother   . Heart disease Father   . Breast cancer Paternal Aunt   . Breast cancer Paternal Aunt    Allergies  Allergen Reactions  . Ciprofloxacin   . Metformin And Related Diarrhea   Prior to Admission medications   Medication Sig Start Date End Date Taking? Authorizing Provider  Dulaglutide (TRULICITY) 1.5 OE/3.2ZY SOPN Inject 1.5 mg into the skin once a  week. 02/27/20  Yes Freeman Caldron M, PA-C  atorvastatin (LIPITOR) 20 MG tablet Take 1 tablet (20 mg total) by mouth daily. 02/27/20   Argentina Donovan, PA-C  Blood Glucose Monitoring Suppl (ONETOUCH VERIO REFLECT) w/Device KIT Check blood glucose level by fingerstick twice per day. Dx: E11.65 11/18/19   Charlott Rakes, MD  Dulaglutide 0.75 MG/0.5ML SOPN INJECT 0.75 MG INTO THE SKIN ONCE A WEEK. 10/30/19 10/29/20  Charlott Rakes, MD  Dulaglutide 1.5 MG/0.5ML SOPN INJECT 1.5 MG INTO THE SKIN ONCE A WEEK. 01/27/20 01/26/21  Charlott Rakes, MD  Dulaglutide 1.5 MG/0.5ML SOPN INJECT 1.5 MG INTO THE SKIN ONCE A WEEK. 11/27/19 11/26/20  Charlott Rakes, MD  DULoxetine (CYMBALTA) 30 MG capsule Take 1 capsule (30 mg total) by mouth daily. 02/27/20 05/27/20  Argentina Donovan, PA-C  DULoxetine (CYMBALTA) 30 MG capsule TAKE 1 CAPSULE (30 MG TOTAL) BY MOUTH DAILY. 11/17/19 11/16/20  Gildardo Pounds, NP  gabapentin (NEURONTIN) 600 MG tablet Take 1 tablet (600 mg total) by mouth 3 (three) times daily. 02/27/20 03/28/20  Argentina Donovan, PA-C  gabapentin (NEURONTIN) 600 MG tablet TAKE 1 TABLET (600 MG TOTAL) BY MOUTH 3 (THREE) TIMES DAILY. 01/28/20 01/27/21  Gildardo Pounds, NP  gabapentin (NEURONTIN) 600 MG tablet TAKE 1 TABLET (600 MG TOTAL) BY MOUTH 3 (THREE) TIMES DAILY. 11/17/19 11/16/20  Gildardo Pounds, NP  glucose blood (ONETOUCH VERIO) test strip Check blood glucose level by fingerstick twice per day. Dx: E11.65 11/18/19   Charlott Rakes, MD  ibuprofen (ADVIL) 600 MG tablet Take 1 tablet (600 mg total) by mouth every 8 (eight) hours as needed. Do not take with Paxil 11/17/19   Gildardo Pounds, NP  ibuprofen (ADVIL) 600 MG tablet TAKE 1 TABLET (600 MG TOTAL) BY MOUTH EVERY 8 (EIGHT) HOURS AS NEEDED. DO NOT TAKE WITH PAXIL 11/17/19 11/16/20  Gildardo Pounds, NP  insulin aspart (NOVOLOG) 100 UNIT/ML FlexPen FOR BLOOD SUGARS 0-150 GIVE 0 UNITS OF INSULIN, 151-200 GIVE 2 UNITS OF INSULIN, 201-250 GIVE 4 UNITS,  251-300 GIVE 6 UNITS, 301-350 GIVE 8 UNITS, 07/02/19 07/01/20  Gildardo Pounds, NP  Insulin Pen Needle (B-D UF III MINI PEN NEEDLES) 31G X 5 MM MISC Use as instructed. Inject into the skin four times daily. E11.65 04/15/19   Gildardo Pounds, NP  Lancets (ONETOUCH DELICA PLUS [id number]) MISC Check blood glucose level by fingerstick twice per day. Dx: E11.65 11/18/19   Charlott Rakes, MD  linaclotide Rolan Lipa) 72 MCG capsule Take 1 capsule (72 mcg total) by mouth daily before breakfast. 02/27/20   Thereasa Solo, Dionne Bucy, PA-C  lisinopril (ZESTRIL) 5 MG tablet Take 1 tablet (5 mg total) by mouth daily. 02/27/20   Argentina Donovan, PA-C  traMADol (ULTRAM) 50 MG tablet Take 1 tablet (50 mg total) by mouth 3 (three) times daily as needed. 04/02/20   Aundra Dubin, PA-C  TRUEplus Lancets 28G MISC Use as instructed. Check blood glucose level by fingerstick twice per day.   E11.65 04/01/19   Gildardo Pounds, NP     Positive ROS: All other systems have been reviewed and were otherwise negative with the exception of those mentioned in the HPI and as above.  Physical Exam: General: Alert, no acute distress Cardiovascular: No pedal edema Respiratory: No cyanosis, no use of accessory musculature GI: abdomen soft Skin: No lesions in the area of chief complaint Neurologic: Sensation intact distally Psychiatric: Patient is competent for consent with normal mood and affect Lymphatic: no lymphedema  MUSCULOSKELETAL: exam stable  Assessment: right carpal tunnel syndrome  Plan: Plan for Procedure(s): RIGHT CARPAL TUNNEL RELEASE  The risks benefits and alternatives were discussed with the patient including but not limited to the risks of nonoperative treatment, versus surgical intervention including infection, bleeding, nerve injury,  blood clots, cardiopulmonary complications, morbidity, mortality, among others, and they were willing to proceed.   Preoperative templating of the joint replacement has been  completed, documented, and submitted to the Operating Room personnel in order to optimize intra-operative equipment management.   Eduard Roux, MD 04/08/2020 10:47 AM

## 2020-04-08 NOTE — Anesthesia Postprocedure Evaluation (Signed)
Anesthesia Post Note  Patient: Denise Hale  Procedure(s) Performed: RIGHT CARPAL TUNNEL RELEASE (Right Wrist)     Patient location during evaluation: PACU Anesthesia Type: MAC Level of consciousness: awake and alert, oriented and awake Pain management: pain level controlled Vital Signs Assessment: post-procedure vital signs reviewed and stable Respiratory status: spontaneous breathing, nonlabored ventilation and respiratory function stable Cardiovascular status: blood pressure returned to baseline and stable Postop Assessment: no apparent nausea or vomiting Anesthetic complications: no   No complications documented.  Last Vitals:  Vitals:   04/08/20 1315 04/08/20 1339  BP: 110/69 120/72  Pulse: 70 86  Resp: 11 12  Temp:  36.7 C  SpO2: 100% 100%    Last Pain:  Vitals:   04/08/20 1339  TempSrc:   PainSc: 0-No pain                 Catalina Gravel

## 2020-04-09 ENCOUNTER — Encounter (HOSPITAL_BASED_OUTPATIENT_CLINIC_OR_DEPARTMENT_OTHER): Payer: Self-pay | Admitting: Orthopaedic Surgery

## 2020-04-15 ENCOUNTER — Encounter: Payer: Self-pay | Admitting: Physician Assistant

## 2020-04-15 ENCOUNTER — Other Ambulatory Visit: Payer: Self-pay

## 2020-04-15 ENCOUNTER — Ambulatory Visit (INDEPENDENT_AMBULATORY_CARE_PROVIDER_SITE_OTHER): Payer: 59 | Admitting: Physician Assistant

## 2020-04-15 DIAGNOSIS — G5601 Carpal tunnel syndrome, right upper limb: Secondary | ICD-10-CM

## 2020-04-15 MED ORDER — HYDROCODONE-ACETAMINOPHEN 5-325 MG PO TABS: 1.0000 | ORAL_TABLET | Freq: Two times a day (BID) | ORAL | 0 refills | Status: DC | PRN

## 2020-04-15 NOTE — Progress Notes (Signed)
   Post-Op Visit Note   Patient: Denise Hale           Date of Birth: [birth year]           MRN: [id number] Visit Date: 04/15/2020 PCP: Claiborne Rigg, NP   Assessment & Plan:  Chief Complaint:  Chief Complaint  Patient presents with  . Right Hand - Pain   Visit Diagnoses:  1. Carpal tunnel syndrome on right     Plan: Patient is a pleasant 50 year old female who comes in today 1 week out right carpal tunnel release.  She has been doing well.  She has slight pain as well as tingling to the palm of her hand.  No fevers or chills.  Examination of her right hand reveals a well healing surgical incision with nylon sutures in place.  No evidence of infection or cellulitis.  Fingers are warm well perfused.  Today, the incision was cleaned and recovered.  Removable brace provided.  No heavy lifting or submerging her hand in water for another 3 weeks.  Follow-up with [place] in 1 week's time for suture removal.  I have agreed to call in 1 more prescription of Norco which she will take sparingly.  Call with concerns or questions in the meantime.  Follow-Up Instructions: Return in about 1 week (around 04/22/2020).   Orders:  No orders of the defined types were placed in this encounter.  Meds ordered this encounter  Medications  . HYDROcodone-acetaminophen (NORCO) 5-325 MG tablet    Sig: Take 1 tablet by mouth 2 (two) times daily as needed.    Dispense:  20 tablet    Refill:  0    Imaging: No new imaging  PMFS History: Patient Active Problem List   Diagnosis Date Noted  . Carpal tunnel syndrome on right 04/07/2020  . DM2 (diabetes mellitus, type 2) (HCC) 10/30/2019  . Influenza vaccine needed 10/30/2019   Past Medical History:  Diagnosis Date  . Anxiety   . Asthma   . Diabetes mellitus without complication ([facility])   . Diverticulitis   . GERD (gastroesophageal reflux disease)     Family History  Problem Relation Age of Onset  . Diabetes Mother   . Thyroid disease Mother   . Heart  disease Father   . Breast cancer Paternal Aunt   . Breast cancer Paternal Aunt     Past Surgical History:  Procedure Laterality Date  . CARPAL TUNNEL RELEASE Right 04/08/2020   Procedure: RIGHT CARPAL TUNNEL RELEASE;  Surgeon: Tarry Kos, MD;  Location: [facility];  Service: Orthopedics;  Laterality: Right;  . CHOLECYSTECTOMY    . COLON SURGERY     Social History   Occupational History  . Not on file  Tobacco Use  . Smoking status: Former Games developer  . Smokeless tobacco: Never Used  Vaping Use  . Vaping Use: Never used  Substance and Sexual Activity  . Alcohol use: Not Currently    Comment: occasionally  . Drug use: Yes    Types: Marijuana  . Sexual activity: Yes    Birth control/protection: None

## 2020-04-22 ENCOUNTER — Other Ambulatory Visit: Payer: Self-pay

## 2020-04-22 ENCOUNTER — Ambulatory Visit (INDEPENDENT_AMBULATORY_CARE_PROVIDER_SITE_OTHER): Payer: 59 | Admitting: Physician Assistant

## 2020-04-22 DIAGNOSIS — G5601 Carpal tunnel syndrome, right upper limb: Secondary | ICD-10-CM

## 2020-04-22 DIAGNOSIS — M65341 Trigger finger, right ring finger: Secondary | ICD-10-CM

## 2020-04-22 MED ORDER — HYDROCODONE-ACETAMINOPHEN 5-325 MG PO TABS: 1.0000 | ORAL_TABLET | Freq: Every day | ORAL | 0 refills | Status: DC | PRN

## 2020-04-22 MED ORDER — PREDNISONE 5 MG (21) PO TBPK: ORAL_TABLET | ORAL | 0 refills | Status: DC

## 2020-04-22 NOTE — Progress Notes (Signed)
Office Visit Note   Patient: Denise Hale           Date of Birth: [birth year]           MRN: [id number] Visit Date: 04/22/2020              Requested by: Claiborne Rigg, NP [address] [place],  [place] [address] PCP: Claiborne Rigg, NP   Assessment & Plan: Visit Diagnoses:  1. Right carpal tunnel syndrome   2. Trigger finger, right ring finger     Plan: Impression is 2-week status post right carpal tunnel release and recurrent right ring trigger finger.  In regards to the carpal tunnel release, sutures were removed and Steri-Strips applied.  She will start range of motion exercises.  She may wear her brace as needed for comfort.  No heavy lifting or submerging your hand underwater for another 2 weeks.  In regards to the trigger finger, we discussed repeat injection versus starting her on a steroid pack for the pain and swelling.  She would like to try the steroid for now.  She will let us know if her symptoms persist at her follow-up appointment in 4 weeks.  Call with concerns or questions in the meantime.  Follow-Up Instructions: Return in about 4 weeks (around 05/20/2020).   Orders:  No orders of the defined types were placed in this encounter.  No orders of the defined types were placed in this encounter.     Procedures: No procedures performed   Clinical Data: No additional findings.   Subjective: Chief Complaint  Patient presents with  . Right Wrist - Follow-up    HPI is a pleasant 50 year old female who comes in today 2 weeks out right carpal tunnel release.  She has been doing okay.  She still notes some tingling to the incision.  She is taking Norco and ibuprofen for pain relief.  Other issue she brings up today is recurrent right ring trigger finger.  She has had this for a while and has had an injection in the past.  Injection helped for no more than 2 weeks.  Her pain has returned.  She denies any triggering but notes pain and stiffness when trying to bend  her finger.  Review of Systems as detailed in HPI.  All others reviewed and are negative.   Objective: Vital Signs: There were no vitals taken for this visit.  Physical Exam well-developed well-nourished female no acute distress.  Alert and oriented x3.  Ortho Exam right hand exam shows a well healing surgical incision with nylon sutures in place.  No evidence of infection or cellulitis.  Fingers are warm and well-perfused.  Right ring finger shows moderate tenderness with a palpable nodule at the A1 pulley.  No reproducible triggering.  She does have slight swelling and limitation with flexion.  She is neurovascular intact distally.  Specialty Comments:  No specialty comments available.  Imaging: No new imaging   PMFS History: Patient Active Problem List   Diagnosis Date Noted  . Carpal tunnel syndrome on right 04/07/2020  . DM2 (diabetes mellitus, type 2) (HCC) 10/30/2019  . Influenza vaccine needed 10/30/2019   Past Medical History:  Diagnosis Date  . Anxiety   . Asthma   . Diabetes mellitus without complication (H[facility])   . Diverticulitis   . GERD (gastroesophageal reflux disease)     Family History  Problem Relation Age of Onset  . Diabetes Mother   . Thyroid disease Mother   .  Heart disease Father   . Breast cancer Paternal Aunt   . Breast cancer Paternal Aunt     Past Surgical History:  Procedure Laterality Date  . CARPAL TUNNEL RELEASE Right 04/08/2020   Procedure: RIGHT CARPAL TUNNEL RELEASE;  Surgeon: Tarry Kos, MD;  Location: [facility];  Service: Orthopedics;  Laterality: Right;  . CHOLECYSTECTOMY    . COLON SURGERY     Social History   Occupational History  . Not on file  Tobacco Use  . Smoking status: Former Games developer  . Smokeless tobacco: Never Used  Vaping Use  . Vaping Use: Never used  Substance and Sexual Activity  . Alcohol use: Not Currently    Comment: occasionally  . Drug use: Yes    Types: Marijuana  . Sexual activity:  Yes    Birth control/protection: None

## 2020-04-26 ENCOUNTER — Other Ambulatory Visit: Payer: Self-pay | Admitting: Physician Assistant

## 2020-04-26 DIAGNOSIS — Z794 Long term (current) use of insulin: Secondary | ICD-10-CM

## 2020-04-26 DIAGNOSIS — E1142 Type 2 diabetes mellitus with diabetic polyneuropathy: Secondary | ICD-10-CM

## 2020-04-26 NOTE — Telephone Encounter (Signed)
Med expired 03/28/20. There are 2 gabapentin with different expiration dates of 11/16/20 and 01/27/21. Please review.

## 2020-05-04 ENCOUNTER — Telehealth: Payer: Self-pay

## 2020-05-04 NOTE — Telephone Encounter (Signed)
Patient called she stated she is having pain in her hand and swelling in her wrist area and the lower part of her thumb area she is also having a lot of pain there call [phone]

## 2020-05-05 NOTE — Telephone Encounter (Signed)
Left voice mail

## 2020-05-11 ENCOUNTER — Telehealth: Payer: Self-pay | Admitting: Nurse Practitioner

## 2020-05-11 NOTE — Telephone Encounter (Signed)
Copied from CRM [id number]. Topic: Quick Communication - Rx Refill/Question >> May 11, 2020  9:13 AM Jaquita Rector A wrote: Medication: fluconazole (DIFLUCAN) 150 MG tablet   Has the patient contacted their pharmacy? Yes.   (Agent: If no, request that the patient contact the pharmacy for the refill.) (Agent: If yes, when and what did the pharmacy advise?)  Preferred Pharmacy (with phone number or street name): [id number] [place], [place] - [address] AT [place] [address]  Phone:  [phone] Fax:  [phone]     Agent: Please be advised that RX refills may take up to 3 business days. We ask that you follow-up with your pharmacy.

## 2020-05-11 NOTE — Telephone Encounter (Signed)
Attempted to call pt regarding refill no answer.

## 2020-05-12 NOTE — Telephone Encounter (Signed)
This pt called wanting refill on Diflucan states she has recurrent yeast infections due to DM. I didn't see in her meds where you have prescribed it before. Does she need to make appt or can you refill it for her?

## 2020-05-13 ENCOUNTER — Ambulatory Visit: Payer: 59 | Attending: Nurse Practitioner | Admitting: Nurse Practitioner

## 2020-05-13 ENCOUNTER — Other Ambulatory Visit: Payer: Self-pay

## 2020-05-13 DIAGNOSIS — N760 Acute vaginitis: Secondary | ICD-10-CM

## 2020-05-13 MED ORDER — FLUCONAZOLE 150 MG PO TABS: 150.0000 mg | ORAL_TABLET | Freq: Once | ORAL | 1 refills | Status: AC

## 2020-05-13 MED ORDER — MUPIROCIN 2 % EX OINT: 1.0000 "application " | TOPICAL_OINTMENT | Freq: Two times a day (BID) | CUTANEOUS | 0 refills | Status: DC

## 2020-05-13 NOTE — Telephone Encounter (Signed)
Diflucan sent

## 2020-05-13 NOTE — Progress Notes (Signed)
Virtual Visit via Telephone Note Due to national recommendations of social distancing due to COVID 19, telehealth visit is felt to be most appropriate for this patient at this time.  I discussed the limitations, risks, security and privacy concerns of performing an evaluation and management service by telephone and the availability of in person appointments. I also discussed with the patient that there may be a patient responsible charge related to this service. The patient expressed understanding and agreed to proceed.    I connected with Denise Hale on 05/13/20  at  10:50 AM EDT  EDT by telephone and verified that I am speaking with the correct person using two identifiers.   Consent I discussed the limitations, risks, security and privacy concerns of performing an evaluation and management service by telephone and the availability of in person appointments. I also discussed with the patient that there may be a patient responsible charge related to this service. The patient expressed understanding and agreed to proceed.   [name]   Location of Provider: [facility]    Persons participating in Telemedicine visit: Denise Denver FNP-BC YY Bien CMA Nadra Howeth    History of Present Illness: Telemedicine visit for: Possible yeast infection  Taking OTC medication for vaginitis. Symptoms include: itching, irritation, increased vaginal discharge.  She denies any symptoms of dysuria, hematuria, bilateral flank pain or lower pelvic pressure/pain.  Left foot.  Past Medical History:  Diagnosis Date  . Anxiety   . Asthma   . Diabetes mellitus without complication (HCC)   . Diverticulitis   . GERD (gastroesophageal reflux disease)     Past Surgical History:  Procedure Laterality Date  . CARPAL TUNNEL RELEASE Right 04/08/2020   Procedure: RIGHT CARPAL TUNNEL RELEASE;  Surgeon: Tarry Kos, MD;  Location: [facility];  Service: Orthopedics;  Laterality: Right;  . CHOLECYSTECTOMY    . COLON SURGERY      Family History  Problem Relation Age of Onset  . Diabetes Mother   . Thyroid disease Mother   . Heart disease Father   . Breast cancer Paternal Aunt   . Breast cancer Paternal Aunt     Social History   Socioeconomic History  . Marital status: Unknown    Spouse name: Not on file  . Number of children: 2  . Years of education: Not on file  . Highest education level: Some college, no degree  Occupational History  . Not on file  Tobacco Use  . Smoking status: Former Games developer  . Smokeless tobacco: Never Used  Vaping Use  . Vaping Use: Never used  Substance and Sexual Activity  . Alcohol use: Not Currently    Comment: occasionally  . Drug use: Yes    Types: Marijuana  . Sexual activity: Yes    Birth control/protection: None  Other Topics Concern  . Not on file  Social History Narrative  . Not on file   Social Determinants of Health   Financial Resource Strain: Not on file  Food Insecurity: Not on file  Transportation Needs: No Transportation Needs  . Lack of Transportation (Medical): No  . Lack of Transportation (Non-Medical): No  Physical Activity: Not on file  Stress: Not on file  Social Connections: Not on file     Observations/Objective: Awake, alert and oriented x 3   ROS  Assessment and Plan: Diagnoses and all orders for this visit:  Acute vaginitis -     fluconazole (DIFLUCAN)  150 MG tablet; Take 1 tablet (150 mg total) by mouth once for 1 dose.     Follow Up Instructions Return in about 6 weeks (around 06/24/2020).     I discussed the assessment and treatment plan with the patient. The patient was provided an opportunity to ask questions and all were answered. The patient agreed with the plan and demonstrated an understanding of the instructions.   The patient was advised to call back or seek an in-person evaluation if the symptoms worsen or if the  condition fails to improve as anticipated.  I provided 10 minutes of non-face-to-face time during this encounter including median intraservice time, reviewing previous notes, labs, imaging, medications and explaining diagnosis and management.  Claiborne Rigg, FNP-BC

## 2020-05-20 ENCOUNTER — Encounter: Payer: 59 | Admitting: Orthopaedic Surgery

## 2020-05-22 ENCOUNTER — Encounter: Payer: Self-pay | Admitting: Nurse Practitioner

## 2020-05-26 ENCOUNTER — Other Ambulatory Visit: Payer: Self-pay | Admitting: Physician Assistant

## 2020-05-26 ENCOUNTER — Ambulatory Visit (INDEPENDENT_AMBULATORY_CARE_PROVIDER_SITE_OTHER): Payer: 59 | Admitting: Physician Assistant

## 2020-05-26 ENCOUNTER — Other Ambulatory Visit: Payer: Self-pay

## 2020-05-26 DIAGNOSIS — G5601 Carpal tunnel syndrome, right upper limb: Secondary | ICD-10-CM

## 2020-05-26 DIAGNOSIS — Z794 Long term (current) use of insulin: Secondary | ICD-10-CM

## 2020-05-26 DIAGNOSIS — E1142 Type 2 diabetes mellitus with diabetic polyneuropathy: Secondary | ICD-10-CM

## 2020-05-26 DIAGNOSIS — Z9889 Other specified postprocedural states: Secondary | ICD-10-CM

## 2020-05-26 NOTE — Progress Notes (Signed)
   Post-Op Visit Note   Patient: Denise Hale           Date of Birth: 30-May-1970           MRN: [id number] Visit Date: 05/26/2020 PCP: Claiborne Rigg, NP   Assessment & Plan:  Chief Complaint:  Chief Complaint  Patient presents with  . Right Wrist - Follow-up   Visit Diagnoses:  1. Carpal tunnel syndrome on right   2. S/P carpal tunnel release     Plan: Patient is a pleasant 50 year old female who comes in today 6 weeks out right carpal tunnel release for mild to moderate median nerve compression.  She has been doing well.  She is still exhibiting some pillar pain and hypersensitivity.  She denies any paresthesias to any other fingers.  Examination of her right hand reveals a fully healed surgical scar without complication.  Fingers are warm and well-perfused.  She is neurovascular intact distally.  At this point, she will continue with range of motion exercises.  She will continue to advance with activity as tolerated.  We recommended desensitization therapy with lotion to help with hypersensitivity.  Follow-up with [place] as needed.  Follow-Up Instructions: Return if symptoms worsen or fail to improve.   Orders:  No orders of the defined types were placed in this encounter.  No orders of the defined types were placed in this encounter.   Imaging: No new imaging  PMFS History: Patient Active Problem List   Diagnosis Date Noted  . Carpal tunnel syndrome on right 04/07/2020  . DM2 (diabetes mellitus, type 2) (HCC) 10/30/2019  . Influenza vaccine needed 10/30/2019   Past Medical History:  Diagnosis Date  . Anxiety   . Asthma   . Diabetes mellitus without complication (HCC)   . Diverticulitis   . GERD (gastroesophageal reflux disease)     Family History  Problem Relation Age of Onset  . Diabetes Mother   . Thyroid disease Mother   . Heart disease Father   . Breast cancer Paternal Aunt   . Breast cancer Paternal Aunt     Past Surgical History:  Procedure Laterality  Date  . CARPAL TUNNEL RELEASE Right 04/08/2020   Procedure: RIGHT CARPAL TUNNEL RELEASE;  Surgeon: Tarry Kos, MD;  Location: [facility];  Service: Orthopedics;  Laterality: Right;  . CHOLECYSTECTOMY    . COLON SURGERY     Social History   Occupational History  . Not on file  Tobacco Use  . Smoking status: Former Games developer  . Smokeless tobacco: Never Used  Vaping Use  . Vaping Use: Never used  Substance and Sexual Activity  . Alcohol use: Not Currently    Comment: occasionally  . Drug use: Yes    Types: Marijuana  . Sexual activity: Yes    Birth control/protection: None

## 2020-05-26 NOTE — Telephone Encounter (Signed)
Requested Prescriptions  Pending Prescriptions Disp Refills  . TRULICITY 1.5 MG/0.5ML SOPN [Pharmacy Med Name: TRULICITY 1.5 MG/0.5 ML PEN] 2 mL 2    Sig: INJECT 1.5 MG INTO THE SKIN ONCE A WEEK.     Endocrinology:  Diabetes - GLP-1 Receptor Agonists Passed - 05/26/2020  1:40 AM      Passed - HBA1C is between 0 and 7.9 and within 180 days    Hemoglobin A1C  Date Value Ref Range Status  02/27/2020 6.8 (A) 4.0 - 5.6 % Final   HbA1c, POC (controlled diabetic range)  Date Value Ref Range Status  10/30/2019 10.7 (A) 0.0 - 7.0 % Final         Passed - Valid encounter within last 6 months    Recent Outpatient Visits          1 week ago Acute vaginitis   [facility] [place], [place] W, NP   2 months ago Type 2 diabetes mellitus with diabetic polyneuropathy, with long-term current use of insulin [facility])   [facility] [place], Jeannett Senior L, RPH-CPP   2 months ago Type 2 diabetes mellitus with diabetic polyneuropathy, with long-term current use of insulin [facility])   [facility] [place], [place], [place]   6 months ago Type 2 diabetes mellitus with diabetic polyneuropathy, with long-term current use of insulin [facility])   [place] [facility] [place], Cornelius Moras, RPH-CPP   6 months ago Need for influenza vaccination   [facility] [place], Cornelius Moras, RPH-CPP      Future Appointments            In 4 weeks Claiborne Rigg, NP [facility]

## 2020-05-27 ENCOUNTER — Telehealth: Payer: Self-pay | Admitting: Nurse Practitioner

## 2020-05-27 NOTE — Telephone Encounter (Signed)
Copied from CRM [id number]. Topic: General - Other >> May 26, 2020  2:51 PM Denise Hale wrote: Reason for CRM:pt is still having issues related to her appt on the 11th. States is still having issue with vaginal problems and is wondering if allergic to something. Pt is requesting that [facility] nurse give her a call back for some type of relief. FU with pt [phone]

## 2020-05-27 NOTE — Telephone Encounter (Signed)
Returned pt call pt states she was given diflucan and it seems like it didn't really help. Pt states she has dm. Pt is schedule a virtual visit with Dr. Laural Benes for 5/26 at 810am.  Pt would like to a video visit. Please send link

## 2020-05-28 ENCOUNTER — Other Ambulatory Visit: Payer: Self-pay

## 2020-05-28 ENCOUNTER — Ambulatory Visit: Payer: 59 | Attending: Internal Medicine | Admitting: Internal Medicine

## 2020-05-28 DIAGNOSIS — N761 Subacute and chronic vaginitis: Secondary | ICD-10-CM

## 2020-05-28 MED ORDER — NYSTATIN-TRIAMCINOLONE 100000-0.1 UNIT/GM-% EX OINT: TOPICAL_OINTMENT | CUTANEOUS | 0 refills | Status: DC

## 2020-05-28 MED ORDER — FLUCONAZOLE 150 MG PO TABS: 150.0000 mg | ORAL_TABLET | Freq: Every day | ORAL | 0 refills | Status: DC

## 2020-05-28 NOTE — Progress Notes (Signed)
Virtual Visit via Telephone Note  I connected with [name] on 05/28/2020 at 12:21 PM by telephone and verified that I am speaking with the correct person using two identifiers.  We have tried to connect via video but it failed so patient agreed to do this via telephone.  Location: Patient: home Provider: office  Participants: Myself Patient CMA: Ms. Julius Bowels    I discussed the limitations, risks, security and privacy concerns of performing an evaluation and management service by telephone and the availability of in person appointments. I also discussed with the patient that there may be a patient responsible charge related to this service. The patient expressed understanding and agreed to proceed.   History of Present Illness: Patient with history of diabetes.  Her PCP is [facility].  This is an urgent care visit for vaginal irritation. She complains of vaginal irritation. Called a few weeks ago to request medication for yeast infection.  Given Diflucan.  Usually goes away with the Diflucan but symptoms have returned.  She is having white vaginal discharge and feeling like the vaginal area is swollen.  3 days ago she states that her underwear was soaked with discharge that it stuck to her skin and it felt as though she was peeling her skin off as she took her underwear off.  She has not been sexually active for the past 4 to 5 months. She also note that a few months ago she was getting pulses on her buttock. 1.  On the right side then on the left side. Her PCP had called in mupirocin ointment or cream for her and this helped in getting them to heal up.  Observations/Objective: No direct observation done as this was a telephone encounter.  Assessment and Plan: 1. Chronic vaginitis -I requested that she come in to do her self swab.  In the meantime I will treat again for presumed yeast by having her take Diflucan for 3 days.  I have also prescribed some Mycolog cream for her to use  on the external vaginal area. - fluconazole (DIFLUCAN) 150 MG tablet; Take 1 tablet (150 mg total) by mouth daily.  Dispense: 3 tablet; Refill: 0 - nystatin-triamcinolone ointment (MYCOLOG); Apply thin film to external vaginal area daily x 5 days.  Do not insert in vagina.  Dispense: 30 g; Refill: 0 - Cervicovaginal ancillary only   Follow Up Instructions: Next available with NP Meredeth Ide   I discussed the assessment and treatment plan with the patient. The patient was provided an opportunity to ask questions and all were answered. The patient agreed with the plan and demonstrated an understanding of the instructions.   The patient was advised to call back or seek an in-person evaluation if the symptoms worsen or if the condition fails to improve as anticipated.  I  Spent 10 minutes on this telephone encounter  Jonah Blue, MD

## 2020-06-03 ENCOUNTER — Ambulatory Visit: Payer: 59 | Admitting: Nurse Practitioner

## 2020-06-24 ENCOUNTER — Ambulatory Visit: Payer: 59 | Attending: Nurse Practitioner | Admitting: Nurse Practitioner

## 2020-06-24 ENCOUNTER — Other Ambulatory Visit: Payer: Self-pay

## 2020-06-24 ENCOUNTER — Telehealth: Payer: Self-pay | Admitting: Nurse Practitioner

## 2020-06-24 ENCOUNTER — Encounter: Payer: Self-pay | Admitting: Nurse Practitioner

## 2020-06-24 DIAGNOSIS — M79605 Pain in left leg: Secondary | ICD-10-CM

## 2020-06-24 DIAGNOSIS — E1142 Type 2 diabetes mellitus with diabetic polyneuropathy: Secondary | ICD-10-CM

## 2020-06-24 DIAGNOSIS — M79604 Pain in right leg: Secondary | ICD-10-CM | POA: Diagnosis not present

## 2020-06-24 DIAGNOSIS — Z794 Long term (current) use of insulin: Secondary | ICD-10-CM | POA: Diagnosis not present

## 2020-06-24 DIAGNOSIS — Z1231 Encounter for screening mammogram for malignant neoplasm of breast: Secondary | ICD-10-CM

## 2020-06-24 MED ORDER — TRULICITY 1.5 MG/0.5ML ~~LOC~~ SOAJ: 1.5000 mg | SUBCUTANEOUS | 2 refills | Status: DC

## 2020-06-24 MED ORDER — GABAPENTIN 600 MG PO TABS
600.0000 mg | ORAL_TABLET | Freq: Three times a day (TID) | ORAL | 3 refills | Status: DC
Start: 2020-06-24 — End: 2020-11-18

## 2020-06-24 NOTE — Telephone Encounter (Signed)
Pt called back in to return call with PCP, but could not get through to the office, pt requested a call back. Please advise.

## 2020-06-24 NOTE — Progress Notes (Signed)
Virtual Visit via Telephone Note Due to national recommendations of social distancing due to COVID 19, telehealth visit is felt to be most appropriate for this patient at this time.  I discussed the limitations, risks, security and privacy concerns of performing an evaluation and management service by telephone and the availability of in person appointments. I also discussed with the patient that there may be a patient responsible charge related to this service. The patient expressed understanding and agreed to proceed.    I connected with Denise Hale on 06/24/20  at   2:30 PM EDT  EDT by telephone and verified that I am speaking with the correct person using two identifiers.  [name]   Location of Provider: [facility]    Persons participating in Telemedicine visit: Bertram Denver FNP-BC Olia Keys    History of Present Illness: Telemedicine visit for: Follow up  She has a past medical history of Anxiety, Asthma, Diabetes mellitus without complication (HCC), Diverticulitis, and GERD (gastroesophageal reflux disease).    BLE pain Pain in bilateral thighs and calves.  Onset a few months ago. Pain described as sharp and continuous. She can not recall to me whether she is taking gabapentin and cymbalta or gabapentin only for pain. She has been working extra at her job which requires lots of standing. Denies pain or swelling in the feet.    DM 2 Well controlled. She is currently taking Trulicity 1.5 mg weekly and sliding scale NovoLog.  She is on renal dose ACE.  Denies chronic cough.  LDL at goal with atorvastatin 20 mg daily. Lab Results  Component Value Date   HGBA1C 6.8 (A) 02/27/2020    Lab Results  Component Value Date   LDLCALC 58 10/30/2019     Past Medical History:  Diagnosis Date   Anxiety    Asthma    Diabetes mellitus without complication (HCC)    Diverticulitis    GERD (gastroesophageal reflux disease)      Past Surgical History:  Procedure Laterality Date   CARPAL TUNNEL RELEASE Right 04/08/2020   Procedure: RIGHT CARPAL TUNNEL RELEASE;  Surgeon: Tarry Kos, MD;  Location: [facility];  Service: Orthopedics;  Laterality: Right;   CHOLECYSTECTOMY     COLON SURGERY      Family History  Problem Relation Age of Onset   Diabetes Mother    Thyroid disease Mother    Heart disease Father    Breast cancer Paternal Aunt    Breast cancer Paternal Aunt     Social History   Socioeconomic History   Marital status: Unknown    Spouse name: Not on file   Number of children: 2   Years of education: Not on file   Highest education level: Some college, no degree  Occupational History   Not on file  Tobacco Use   Smoking status: Former    Pack years: 0.00   Smokeless tobacco: Never  Vaping Use   Vaping Use: Never used  Substance and Sexual Activity   Alcohol use: Not Currently    Comment: occasionally   Drug use: Yes    Types: Marijuana   Sexual activity: Yes    Birth control/protection: None  Other Topics Concern   Not on file  Social History Narrative   Not on file   Social Determinants of Health   Financial Resource Strain: Not on file  Food Insecurity: Not on file  Transportation Needs: No Transportation Needs  Lack of Transportation (Medical): No   Lack of Transportation (Non-Medical): No  Physical Activity: Not on file  Stress: Not on file  Social Connections: Not on file     Observations/Objective: Awake, alert and oriented x 3   Review of Systems  Constitutional:  Negative for fever, malaise/fatigue and weight loss.  HENT: Negative.  Negative for nosebleeds.   Eyes: Negative.  Negative for blurred vision, double vision and photophobia.  Respiratory: Negative.  Negative for cough and shortness of breath.   Cardiovascular: Negative.  Negative for chest pain, palpitations and leg swelling.  Gastrointestinal: Negative.  Negative for heartburn,  nausea and vomiting.  Musculoskeletal:  Positive for joint pain. Negative for falls and myalgias.       See HPI  Neurological:  Positive for tingling and sensory change. Negative for dizziness, focal weakness, seizures and headaches.  Psychiatric/Behavioral: Negative.  Negative for suicidal ideas.    Assessment and Plan: Diagnoses and all orders for this visit:  Type 2 diabetes mellitus with diabetic polyneuropathy, with long-term current use of insulin (HCC) -     gabapentin (NEURONTIN) 600 MG tablet; Take 1 tablet (600 mg total) by mouth 3 (three) times daily. -     Dulaglutide (TRULICITY) 1.5 MG/0.5ML SOPN; Inject 1.5 mg into the skin once a week. Continue blood sugar control as discussed in office today, low carbohydrate diet, and regular physical exercise as tolerated, 150 minutes per week (30 min each day, 5 days per week, or 50 min 3 days per week). Keep blood sugar logs with fasting goal of 90-130 mg/dl, post prandial (after you eat) less than 180.  For Hypoglycemia: BS <60 and Hyperglycemia BS >400; contact the clinic ASAP. Annual eye exams and foot exams are recommended.   Breast cancer screening by mammogram -     MM 3D SCREEN BREAST BILATERAL; Future  Pain in both lower extremities  Take gabapentin and cymbalta as prescribed for leg pain  Follow Up Instructions Return in about 10 weeks (around 09/02/2020) for HTN/HPL/DM.     I discussed the assessment and treatment plan with the patient. The patient was provided an opportunity to ask questions and all were answered. The patient agreed with the plan and demonstrated an understanding of the instructions.   The patient was advised to call back or seek an in-person evaluation if the symptoms worsen or if the condition fails to improve as anticipated.  I provided 18 minutes of non-face-to-face time during this encounter including median intraservice time, reviewing previous notes, labs, imaging, medications and explaining diagnosis  and management.  Claiborne Rigg, FNP-BC

## 2020-06-24 NOTE — Telephone Encounter (Signed)
No answer. Left voicemail to return call to office

## 2020-07-16 ENCOUNTER — Telehealth: Payer: Self-pay | Admitting: Nurse Practitioner

## 2020-07-16 NOTE — Telephone Encounter (Signed)
Copied from CRM [id number]. Topic: General - Other >> Jul 10, 2020 12:47 PM Payton Spark N wrote: Reason for CRM: Pt called in stating she had been speaking with PCP about a medication she was taking with the gabapentin , and wanted to casll her back to let her know she did want to get the medicstion refilled, but she did not know the nsame of that medication.   Spoke with patient and she need clarification on the medications she should be taking for her neuropathy. She did not know the names of her medication.

## 2020-07-17 NOTE — Telephone Encounter (Signed)
Pt was called and informed that she should be taking Gabapentin and Cymbalta for her neuropathy pain.

## 2020-08-17 ENCOUNTER — Other Ambulatory Visit: Payer: Self-pay

## 2020-08-17 ENCOUNTER — Ambulatory Visit: Payer: 59 | Attending: Family Medicine | Admitting: Family Medicine

## 2020-08-17 VITALS — BP 148/72 | HR 64 | Ht 66.0 in | Wt 147.8 lb

## 2020-08-17 DIAGNOSIS — G8929 Other chronic pain: Secondary | ICD-10-CM | POA: Diagnosis not present

## 2020-08-17 DIAGNOSIS — M5442 Lumbago with sciatica, left side: Secondary | ICD-10-CM | POA: Diagnosis not present

## 2020-08-17 MED ORDER — TIZANIDINE HCL 4 MG PO TABS: 4.0000 mg | ORAL_TABLET | Freq: Four times a day (QID) | ORAL | 0 refills | Status: DC | PRN

## 2020-08-17 MED ORDER — DULOXETINE HCL 60 MG PO CPEP: 60.0000 mg | ORAL_CAPSULE | Freq: Every day | ORAL | 3 refills | Status: DC

## 2020-08-17 NOTE — Progress Notes (Signed)
Subjective:  Patient ID: Denise Hale, female    DOB: 06/16/1970  Age: 50 y.o. MRN: [id number]  CC: Leg Pain   HPI Denise Hale is a 50 y.o. year old female patient of [name], FNP with a history of hypertension, type 2 diabetes mellitus here for an acute visit  Interval History: She has low back pain radiating down her LLE, also has associated numbness. Pain never goes away and is getting worse. She is uncomfortable sitting, walking. OTC medications have been ineffective. It has affected her walking to the extent that she tracks her legs when she ambulates.  Denies history of saddle anesthesia, loss of sphincteric function. She complained of this symptoms 2 months ago at PCP visit. She assumed it was because she worked a lot of hours.  She has an upcoming appointment with PCP for chronic disease management on 8/30. Past Medical History:  Diagnosis Date   Anxiety    Asthma    Diabetes mellitus without complication ([place])    Diverticulitis    GERD (gastroesophageal reflux disease)     Past Surgical History:  Procedure Laterality Date   CARPAL TUNNEL RELEASE Right 04/08/2020   Procedure: RIGHT CARPAL TUNNEL RELEASE;  Surgeon: Leandrew Koyanagi, MD;  Location: [place];  Service: Orthopedics;  Laterality: Right;   CHOLECYSTECTOMY     COLON SURGERY      Family History  Problem Relation Age of Onset   Diabetes Mother    Thyroid disease Mother    Heart disease Father    Breast cancer Paternal Aunt    Breast cancer Paternal Aunt     Allergies  Allergen Reactions   Ciprofloxacin    Metformin And Related Diarrhea    Outpatient Medications Prior to Visit  Medication Sig Dispense Refill   atorvastatin (LIPITOR) 20 MG tablet Take 1 tablet (20 mg total) by mouth daily. 90 tablet 3   Dulaglutide 1.5 MG/0.5ML SOPN INJECT 1.5 MG INTO THE SKIN ONCE A WEEK. 2 mL 2   lisinopril (ZESTRIL) 5 MG tablet Take 1 tablet (5 mg total) by mouth daily. 90 tablet 3   Blood  Glucose Monitoring Suppl (ONETOUCH VERIO REFLECT) w/Device KIT Check blood glucose level by fingerstick twice per day. Dx: E11.65 (Patient not taking: Reported on 08/17/2020) 1 kit 0   fluconazole (DIFLUCAN) 150 MG tablet Take 1 tablet (150 mg total) by mouth daily. (Patient not taking: Reported on 08/17/2020) 3 tablet 0   gabapentin (NEURONTIN) 600 MG tablet Take 1 tablet (600 mg total) by mouth 3 (three) times daily. 90 tablet 3   glucose blood (ONETOUCH VERIO) test strip Check blood glucose level by fingerstick twice per day. Dx: E11.65 (Patient not taking: Reported on 08/17/2020) 100 each 2   HYDROcodone-acetaminophen (NORCO) 5-325 MG tablet Take 1 tablet by mouth daily as needed. (Patient not taking: Reported on 08/17/2020) 10 tablet 0   ibuprofen (ADVIL) 600 MG tablet TAKE 1 TABLET (600 MG TOTAL) BY MOUTH EVERY 8 (EIGHT) HOURS AS NEEDED. DO NOT TAKE WITH PAXIL (Patient not taking: Reported on 08/17/2020) 60 tablet 1   insulin aspart (NOVOLOG) 100 UNIT/ML FlexPen FOR BLOOD SUGARS 0-150 GIVE 0 UNITS OF INSULIN, 151-200 GIVE 2 UNITS OF INSULIN, 201-250 GIVE 4 UNITS, 251-300 GIVE 6 UNITS, 301-350 GIVE 8 UNITS, 15 mL 11   Insulin Pen Needle (B-D UF III MINI PEN NEEDLES) 31G X 5 MM MISC Use as instructed. Inject into the skin four times daily. E11.65 (Patient not taking: Reported on  08/17/2020) 200 each 6   Lancets (ONETOUCH DELICA PLUS PYKDXI33A) MISC Check blood glucose level by fingerstick twice per day. Dx: E11.65 (Patient not taking: Reported on 08/17/2020) 100 each 2   linaclotide (LINZESS) 72 MCG capsule Take 1 capsule (72 mcg total) by mouth daily before breakfast. (Patient not taking: Reported on 08/17/2020) 90 capsule 2   mupirocin ointment (BACTROBAN) 2 % Apply 1 application topically 2 (two) times daily. (Patient not taking: Reported on 08/17/2020) 60 g 0   nystatin-triamcinolone ointment (MYCOLOG) Apply thin film to external vaginal area daily x 5 days.  Do not insert in vagina. (Patient not taking:  Reported on 08/17/2020) 30 g 0   predniSONE (STERAPRED UNI-PAK 21 TAB) 5 MG (21) TBPK tablet Take as directed (Patient not taking: Reported on 08/17/2020) 21 tablet 0   traMADol (ULTRAM) 50 MG tablet Take 1 tablet (50 mg total) by mouth 3 (three) times daily as needed. (Patient not taking: Reported on 08/17/2020) 30 tablet 0   TRUEplus Lancets 28G MISC Use as instructed. Check blood glucose level by fingerstick twice per day.   E11.65 (Patient not taking: Reported on 08/17/2020) 100 each 3   Dulaglutide (TRULICITY) 1.5 SN/0.5LZ SOPN Inject 1.5 mg into the skin once a week. 2 mL 2   No facility-administered medications prior to visit.     ROS Review of Systems  Constitutional:  Negative for activity change, appetite change and fatigue.  HENT:  Negative for congestion, sinus pressure and sore throat.   Eyes:  Negative for visual disturbance.  Respiratory:  Negative for cough, chest tightness, shortness of breath and wheezing.   Cardiovascular:  Negative for chest pain and palpitations.  Gastrointestinal:  Negative for abdominal distention, abdominal pain and constipation.  Endocrine: Negative for polydipsia.  Genitourinary:  Negative for dysuria and frequency.  Musculoskeletal:        See HPI  Skin:  Negative for rash.  Neurological:  Negative for tremors, light-headedness and numbness.  Hematological:  Does not bruise/bleed easily.  Psychiatric/Behavioral:  Negative for agitation and behavioral problems.    Objective:  BP (!) 148/72   Pulse 64   Ht '5\' 6"'  (1.676 m)   Wt 147 lb 12.8 oz (67 kg)   SpO2 100%   BMI 23.86 kg/m   BP/Weight 08/17/2020 04/08/2020 7/67/3419  Systolic BP 379 024 -  Diastolic BP 72 72 -  Wt. (Lbs) 147.8 141.98 146  BMI 23.86 22.92 23.57      Physical Exam Constitutional:      Appearance: She is well-developed.  Cardiovascular:     Rate and Rhythm: Normal rate.     Heart sounds: Normal heart sounds. No murmur heard. Pulmonary:     Effort: Pulmonary  effort is normal.     Breath sounds: Normal breath sounds. No wheezing or rales.  Chest:     Chest wall: No tenderness.  Abdominal:     General: Bowel sounds are normal. There is no distension.     Palpations: Abdomen is soft. There is no mass.     Tenderness: There is no abdominal tenderness.  Musculoskeletal:     Right lower leg: No edema.     Left lower leg: No edema.     Comments: Negative straight leg raise bilaterally Slight tenderness on palpation of lumbar spine  Neurological:     Mental Status: She is alert and oriented to person, place, and time.     Gait: Gait abnormal.  Psychiatric:  Mood and Affect: Mood normal.    CMP Latest Ref Rng & Units 04/06/2020 02/27/2020 07/02/2019  Glucose 70 - 99 mg/dL 128(H) 117(H) 274(H)  BUN 6 - 20 mg/dL '7 8 10  ' Creatinine 0.44 - 1.00 mg/dL 0.53 0.65 0.63  Sodium 135 - 145 mmol/L 139 142 139  Potassium 3.5 - 5.1 mmol/L 3.9 3.8 4.5  Chloride 98 - 111 mmol/L 103 105 97  CO2 22 - 32 mmol/L '31 24 28  ' Calcium 8.9 - 10.3 mg/dL 9.3 9.2 9.6  Total Protein 6.0 - 8.5 g/dL - 7.1 7.5  Total Bilirubin 0.0 - 1.2 mg/dL - <0.2 0.2  Alkaline Phos 44 - 121 IU/L - 90 116  AST 0 - 40 IU/L - 17 24  ALT 0 - 32 IU/L - 22 48(H)    Lipid Panel     Component Value Date/Time   CHOL 144 10/30/2019 1433   TRIG 80 10/30/2019 1433   HDL 71 10/30/2019 1433   CHOLHDL 2.0 10/30/2019 1433   LDLCALC 58 10/30/2019 1433    CBC    Component Value Date/Time   WBC 5.5 02/27/2020 0946   RBC 4.03 02/27/2020 0946   HGB 12.0 02/27/2020 0946   HCT 36.4 02/27/2020 0946   PLT 304 02/27/2020 0946   MCV 90 02/27/2020 0946   MCH 29.8 02/27/2020 0946   MCHC 33.0 02/27/2020 0946   RDW 13.3 02/27/2020 0946   LYMPHSABS 2.3 02/27/2020 0946   EOSABS 0.2 02/27/2020 0946   BASOSABS 0.0 02/27/2020 0946    Lab Results  Component Value Date   HGBA1C 6.8 (A) 02/27/2020    Assessment & Plan:  1. Chronic bilateral low back pain with left-sided sciatica Due to  chronicity of condition we will order x-rays She will benefit from PT Initiate Cymbalta and tizanidine as NSAIDs have been ineffective Advised to apply heat or ice whichever is tolerated to painful areas. Counseled on evidence of improvement in pain control with regards to yoga, water aerobics, massage, home physical therapy, exercise as tolerated. - DG Lumbar Spine Complete; Future - DULoxetine (CYMBALTA) 60 MG capsule; Take 1 capsule (60 mg total) by mouth daily. For back pain  Dispense: 30 capsule; Refill: 3 - tiZANidine (ZANAFLEX) 4 MG tablet; Take 1 tablet (4 mg total) by mouth every 6 (six) hours as needed for muscle spasms.  Dispense: 30 tablet; Refill: 0 - Ambulatory referral to Physical Therapy    No orders of the defined types were placed in this encounter.   Return for Keep previously scheduled appointment with PCP for chronic disease management.       Charlott Rakes, MD, FAAFP. [facility] [place], [place]   08/17/2020, 4:11 PM

## 2020-08-17 NOTE — Progress Notes (Signed)
Having pain in lower back down left legs.  Has swelling in legs.

## 2020-08-17 NOTE — Patient Instructions (Signed)
Back Exercises The following exercises strengthen the muscles that help to support the trunk and back. They also help to keep the lower back flexible. Doing these exercises can help to prevent back pain or lessen existing pain. If you have back pain or discomfort, try doing these exercises 2-3 times each day or as told by your health care provider. As your pain improves, do them once each day, but increase the number of times that you repeat the steps for each exercise (do more repetitions). To prevent the recurrence of back pain, continue to do these exercises once each day or as told by your health care provider. Do exercises exactly as told by your health care provider and adjust them as directed. It is normal to feel mild stretching, pulling, tightness, or discomfort as you do these exercises, but you should stop right away if youfeel sudden pain or your pain gets worse. Exercises Single knee to chest Repeat these steps 3-5 times for each leg: Lie on your back on a firm bed or the floor with your legs extended. Bring one knee to your chest. Your other leg should stay extended and in contact with the floor. Hold your knee in place by grabbing your knee or thigh with both hands and hold. Pull on your knee until you feel a gentle stretch in your lower back or buttocks. Hold the stretch for 10-30 seconds. Slowly release and straighten your leg. Pelvic tilt Repeat these steps 5-10 times: Lie on your back on a firm bed or the floor with your legs extended. Bend your knees so they are pointing toward the ceiling and your feet are flat on the floor. Tighten your lower abdominal muscles to press your lower back against the floor. This motion will tilt your pelvis so your tailbone points up toward the ceiling instead of pointing to your feet or the floor. With gentle tension and even breathing, hold this position for 5-10 seconds. Cat-cow Repeat these steps until your lower back becomes more  flexible: Get into a hands-and-knees position on a firm surface. Keep your hands under your shoulders, and keep your knees under your hips. You may place padding under your knees for comfort. Let your head hang down toward your chest. Contract your abdominal muscles and point your tailbone toward the floor so your lower back becomes rounded like the back of a cat. Hold this position for 5 seconds. Slowly lift your head, let your abdominal muscles relax and point your tailbone up toward the ceiling so your back forms a sagging arch like the back of a cow. Hold this position for 5 seconds.  Press-ups Repeat these steps 5-10 times: Lie on your abdomen (face-down) on the floor. Place your palms near your head, about shoulder-width apart. Keeping your back as relaxed as possible and keeping your hips on the floor, slowly straighten your arms to raise the top half of your body and lift your shoulders. Do not use your back muscles to raise your upper torso. You may adjust the placement of your hands to make yourself more comfortable. Hold this position for 5 seconds while you keep your back relaxed. Slowly return to lying flat on the floor.  Bridges Repeat these steps 10 times: Lie on your back on a firm surface. Bend your knees so they are pointing toward the ceiling and your feet are flat on the floor. Your arms should be flat at your sides, next to your body. Tighten your buttocks muscles and lift your   buttocks off the floor until your waist is at almost the same height as your knees. You should feel the muscles working in your buttocks and the back of your thighs. If you do not feel these muscles, slide your feet 1-2 inches farther away from your buttocks. Hold this position for 3-5 seconds. Slowly lower your hips to the starting position, and allow your buttocks muscles to relax completely. If this exercise is too easy, try doing it with your arms crossed over yourchest. Abdominal  crunches Repeat these steps 5-10 times: Lie on your back on a firm bed or the floor with your legs extended. Bend your knees so they are pointing toward the ceiling and your feet are flat on the floor. Cross your arms over your chest. Tip your chin slightly toward your chest without bending your neck. Tighten your abdominal muscles and slowly raise your trunk (torso) high enough to lift your shoulder blades a tiny bit off the floor. Avoid raising your torso higher than that because it can put too much stress on your low back and does not help to strengthen your abdominal muscles. Slowly return to your starting position. Back lifts Repeat these steps 5-10 times: Lie on your abdomen (face-down) with your arms at your sides, and rest your forehead on the floor. Tighten the muscles in your legs and your buttocks. Slowly lift your chest off the floor while you keep your hips pressed to the floor. Keep the back of your head in line with the curve in your back. Your eyes should be looking at the floor. Hold this position for 3-5 seconds. Slowly return to your starting position. Contact a health care provider if: Your back pain or discomfort gets much worse when you do an exercise. Your worsening back pain or discomfort does not lessen within 2 hours after you exercise. If you have any of these problems, stop doing these exercises right away. Do not do them again unless your health care provider says that you can. Get help right away if: You develop sudden, severe back pain. If this happens, stop doing the exercises right away. Do not do them again unless your health care provider says that you can. This information is not intended to replace advice given to you by your health care provider. Make sure you discuss any questions you have with your healthcare provider. Document Revised: 04/26/2018 Document Reviewed: 09/21/2017 Elsevier Patient Education  2022 [employer]  

## 2020-08-18 ENCOUNTER — Encounter: Payer: Self-pay | Admitting: Family Medicine

## 2020-08-19 ENCOUNTER — Other Ambulatory Visit: Payer: Self-pay

## 2020-08-19 ENCOUNTER — Ambulatory Visit (HOSPITAL_COMMUNITY)
Admission: RE | Admit: 2020-08-19 | Discharge: 2020-08-19 | Disposition: A | Discharge status: Home / Self Care | Payer: 59 | Source: Ambulatory Visit | Attending: Family Medicine | Admitting: Family Medicine

## 2020-08-19 DIAGNOSIS — G8929 Other chronic pain: Secondary | ICD-10-CM | POA: Diagnosis present

## 2020-08-19 DIAGNOSIS — M5442 Lumbago with sciatica, left side: Secondary | ICD-10-CM | POA: Diagnosis present

## 2020-08-22 ENCOUNTER — Other Ambulatory Visit: Payer: Self-pay | Admitting: Physician Assistant

## 2020-08-22 DIAGNOSIS — Z794 Long term (current) use of insulin: Secondary | ICD-10-CM

## 2020-08-22 DIAGNOSIS — F32A Depression, unspecified: Secondary | ICD-10-CM

## 2020-08-22 DIAGNOSIS — F419 Anxiety disorder, unspecified: Secondary | ICD-10-CM

## 2020-08-22 NOTE — Telephone Encounter (Signed)
Requested medications are due for refill today yes  Requested medications are on the active medication list yes  Last refill 5/24  Last visit 08/17/20  Future visit scheduled 09/01/20  Notes to clinic Dose is inconsistent with current med list.

## 2020-08-27 ENCOUNTER — Telehealth: Payer: Self-pay | Admitting: Nurse Practitioner

## 2020-08-27 NOTE — Telephone Encounter (Signed)
Copied from CRM [id number]. Topic: General - Other >> Aug 26, 2020  1:32 PM Denise Hale wrote: Reason for CRM: Pt called in wanting to get xray results and requested Hale call back. Please advise. >> Aug 27, 2020 10:27 AM Denise Hale wrote: Patient has made an additional call about this matter  Please contact further when possible

## 2020-08-28 NOTE — Telephone Encounter (Signed)
Pt name and DOB verified. Patient aware of results and result note per Dr.Newlin

## 2020-09-01 ENCOUNTER — Ambulatory Visit: Payer: 59 | Attending: Nurse Practitioner | Admitting: Nurse Practitioner

## 2020-09-01 ENCOUNTER — Other Ambulatory Visit: Payer: Self-pay

## 2020-09-01 ENCOUNTER — Encounter: Payer: Self-pay | Admitting: Nurse Practitioner

## 2020-09-01 VITALS — BP 128/84 | HR 76 | Ht 66.0 in | Wt 141.2 lb

## 2020-09-01 DIAGNOSIS — M5442 Lumbago with sciatica, left side: Secondary | ICD-10-CM | POA: Diagnosis not present

## 2020-09-01 DIAGNOSIS — G8929 Other chronic pain: Secondary | ICD-10-CM

## 2020-09-01 DIAGNOSIS — Z794 Long term (current) use of insulin: Secondary | ICD-10-CM | POA: Diagnosis not present

## 2020-09-01 DIAGNOSIS — R110 Nausea: Secondary | ICD-10-CM

## 2020-09-01 DIAGNOSIS — E785 Hyperlipidemia, unspecified: Secondary | ICD-10-CM

## 2020-09-01 DIAGNOSIS — E1142 Type 2 diabetes mellitus with diabetic polyneuropathy: Secondary | ICD-10-CM | POA: Diagnosis not present

## 2020-09-01 DIAGNOSIS — Z1159 Encounter for screening for other viral diseases: Secondary | ICD-10-CM

## 2020-09-01 DIAGNOSIS — Z1211 Encounter for screening for malignant neoplasm of colon: Secondary | ICD-10-CM

## 2020-09-01 LAB — GLUCOSE, POCT (MANUAL RESULT ENTRY): POC Glucose: 111 mg/dl — AB (ref 70–99)

## 2020-09-01 LAB — POCT GLYCOSYLATED HEMOGLOBIN (HGB A1C): HbA1c, POC (controlled diabetic range): 6.6 % (ref 0.0–7.0)

## 2020-09-01 MED ORDER — ONDANSETRON 4 MG PO TBDP: 4.0000 mg | ORAL_TABLET | Freq: Three times a day (TID) | ORAL | 1 refills | Status: DC | PRN

## 2020-09-01 MED ORDER — TIZANIDINE HCL 4 MG PO TABS: 4.0000 mg | ORAL_TABLET | Freq: Four times a day (QID) | ORAL | 1 refills | Status: DC | PRN

## 2020-09-01 NOTE — Progress Notes (Signed)
Assessment & Plan:  Denise Hale was seen today for diabetes.  Diagnoses and all orders for this visit:  Type 2 diabetes mellitus with diabetic polyneuropathy, with long-term current use of insulin (HCC) -     POCT glucose (manual entry) -     POCT glycosylated hemoglobin (Hb A1C) -     CMP14+EGFR -     Ambulatory referral to Ophthalmology Continue blood sugar control as discussed in office today, low carbohydrate diet, and regular physical exercise as tolerated, 150 minutes per week (30 min each day, 5 days per week, or 50 min 3 days per week). Keep blood sugar logs with fasting goal of 90-130 mg/dl, post prandial (after you eat) less than 180.  For Hypoglycemia: BS <60 and Hyperglycemia BS >400; contact the clinic ASAP. Annual eye exams and foot exams are recommended.   Dyslipidemia, goal LDL below 70 INSTRUCTIONS: Work on a low fat, heart healthy diet and participate in regular aerobic exercise program by working out at least 150 minutes per week; 5 days a week-30 minutes per day. Avoid red meat/beef/steak,  fried foods. junk foods, sodas, sugary drinks, unhealthy snacking, alcohol and smoking.  Drink at least 80 oz of water per day and monitor your carbohydrate intake daily.    Chronic bilateral low back pain with left-sided sciatica -     tiZANidine (ZANAFLEX) 4 MG tablet; Take 1 tablet (4 mg total) by mouth every 6 (six) hours as needed for muscle spasms.  Chronic nausea -     Lipase -     CBC -     ondansetron (ZOFRAN ODT) 4 MG disintegrating tablet; Take 1 tablet (4 mg total) by mouth every 8 (eight) hours as needed for nausea or vomiting.  Need for hepatitis C screening test -     HCV Ab w Reflex to Quant PCR  Colon cancer screening -     Ambulatory referral to Gastroenterology   Patient has been counseled on age-appropriate routine health concerns for screening and prevention. These are reviewed and up-to-date. Referrals have been placed accordingly. Immunizations are  up-to-date or declined.    Subjective:   Chief Complaint  Patient presents with   Diabetes   Diabetes Pertinent negatives for hypoglycemia include no dizziness, headaches or seizures. Pertinent negatives for diabetes include no blurred vision, no chest pain and no weight loss.  Denise Hale 50 y.o. female presents to office today for follow up to HPL and DM.  She has a past medical history of Anxiety, Asthma, DM2, Diverticulitis, and GERD   DM 2 Well controlled with truliicty 1.5 mg weekly. She does endorse early morning nausea which may be caused by her trulicity. LDL at goal with atorvastatin 20 mg daily.  Lab Results  Component Value Date   HGBA1C 6.6 09/01/2020    Lab Results  Component Value Date   HGBA1C 6.8 (A) 02/27/2020   . Lab Results  Component Value Date   LDLCALC 58 10/30/2019    Chronic low back pain Well controlled with gabapentin, duloxetine and tizanidine. Pain radiates into LLE. Associated symptoms: numbness. Aggravating factors: prolonged sitting, walking and standing.      Review of Systems  Constitutional:  Negative for fever, malaise/fatigue and weight loss.  HENT: Negative.  Negative for nosebleeds.   Eyes: Negative.  Negative for blurred vision, double vision and photophobia.  Respiratory: Negative.  Negative for cough and shortness of breath.   Cardiovascular: Negative.  Negative for chest pain, palpitations and leg swelling.  Gastrointestinal:  Positive for nausea. Negative for abdominal pain, blood in stool, constipation, diarrhea, heartburn, melena and vomiting.  Musculoskeletal:  Positive for back pain. Negative for myalgias.  Neurological: Negative.  Negative for dizziness, focal weakness, seizures and headaches.  Psychiatric/Behavioral: Negative.  Negative for suicidal ideas.    Past Medical History:  Diagnosis Date   Anxiety    Asthma    Diabetes mellitus without complication ([place])    Diverticulitis    GERD (gastroesophageal reflux  disease)     Past Surgical History:  Procedure Laterality Date   CARPAL TUNNEL RELEASE Right 04/08/2020   Procedure: RIGHT CARPAL TUNNEL RELEASE;  Surgeon: Leandrew Koyanagi, MD;  Location: [facility];  Service: Orthopedics;  Laterality: Right;   CHOLECYSTECTOMY     COLON SURGERY      Family History  Problem Relation Age of Onset   Diabetes Mother    Thyroid disease Mother    Heart disease Father    Breast cancer Paternal Aunt    Breast cancer Paternal Aunt     Social History Reviewed with no changes to be made today.   Outpatient Medications Prior to Visit  Medication Sig Dispense Refill   atorvastatin (LIPITOR) 20 MG tablet Take 1 tablet (20 mg total) by mouth daily. 90 tablet 3   Blood Glucose Monitoring Suppl (ONETOUCH VERIO REFLECT) w/Device KIT Check blood glucose level by fingerstick twice per day. Dx: E11.65 1 kit 0   Dulaglutide 1.5 MG/0.5ML SOPN INJECT 1.5 MG INTO THE SKIN ONCE A WEEK. 2 mL 2   DULoxetine (CYMBALTA) 60 MG capsule Take 1 capsule (60 mg total) by mouth daily. For back pain 30 capsule 3   fluconazole (DIFLUCAN) 150 MG tablet Take 1 tablet (150 mg total) by mouth daily. 3 tablet 0   glucose blood (ONETOUCH VERIO) test strip Check blood glucose level by fingerstick twice per day. Dx: E11.65 100 each 2   HYDROcodone-acetaminophen (NORCO) 5-325 MG tablet Take 1 tablet by mouth daily as needed. 10 tablet 0   ibuprofen (ADVIL) 600 MG tablet TAKE 1 TABLET (600 MG TOTAL) BY MOUTH EVERY 8 (EIGHT) HOURS AS NEEDED. DO NOT TAKE WITH PAXIL 60 tablet 1   Insulin Pen Needle (B-D UF III MINI PEN NEEDLES) 31G X 5 MM MISC Use as instructed. Inject into the skin four times daily. E11.65 200 each 6   Lancets (ONETOUCH DELICA PLUS YYTKPT46F) MISC Check blood glucose level by fingerstick twice per day. Dx: E11.65 100 each 2   linaclotide (LINZESS) 72 MCG capsule Take 1 capsule (72 mcg total) by mouth daily before breakfast. 90 capsule 2   lisinopril (ZESTRIL) 5 MG tablet  Take 1 tablet (5 mg total) by mouth daily. 90 tablet 3   mupirocin ointment (BACTROBAN) 2 % Apply 1 application topically 2 (two) times daily. 60 g 0   nystatin-triamcinolone ointment (MYCOLOG) Apply thin film to external vaginal area daily x 5 days.  Do not insert in vagina. (Patient taking differently: Apply thin film to external vaginal area daily x 5 days.  Do not insert in vagina.) 30 g 0   traMADol (ULTRAM) 50 MG tablet Take 1 tablet (50 mg total) by mouth 3 (three) times daily as needed. 30 tablet 0   TRUEplus Lancets 28G MISC Use as instructed. Check blood glucose level by fingerstick twice per day.   E11.65 (Patient taking differently: Use as instructed. Check blood glucose level by fingerstick twice per day.   E11.65) 100 each 3   predniSONE (  STERAPRED UNI-PAK 21 TAB) 5 MG (21) TBPK tablet Take as directed 21 tablet 0   tiZANidine (ZANAFLEX) 4 MG tablet Take 1 tablet (4 mg total) by mouth every 6 (six) hours as needed for muscle spasms. 30 tablet 0   gabapentin (NEURONTIN) 600 MG tablet Take 1 tablet (600 mg total) by mouth 3 (three) times daily. 90 tablet 3   insulin aspart (NOVOLOG) 100 UNIT/ML FlexPen FOR BLOOD SUGARS 0-150 GIVE 0 UNITS OF INSULIN, 151-200 GIVE 2 UNITS OF INSULIN, 201-250 GIVE 4 UNITS, 251-300 GIVE 6 UNITS, 301-350 GIVE 8 UNITS, 15 mL 11   No facility-administered medications prior to visit.    Allergies  Allergen Reactions   Ciprofloxacin    Metformin And Related Diarrhea       Objective:    BP 128/84   Pulse 76   Ht '5\' 6"'  (1.676 m)   Wt 141 lb 4 oz (64.1 kg)   SpO2 100%   BMI 22.80 kg/m  Wt Readings from Last 3 Encounters:  09/01/20 141 lb 4 oz (64.1 kg)  08/17/20 147 lb 12.8 oz (67 kg)  04/08/20 141 lb 15.6 oz (64.4 kg)    Physical Exam Vitals and nursing note reviewed.  Constitutional:      Appearance: She is well-developed.  HENT:     Head: Normocephalic and atraumatic.  Cardiovascular:     Rate and Rhythm: Normal rate and regular rhythm.      Heart sounds: Normal heart sounds. No murmur heard.   No friction rub. No gallop.  Pulmonary:     Effort: Pulmonary effort is normal. No tachypnea or respiratory distress.     Breath sounds: Normal breath sounds. No decreased breath sounds, wheezing, rhonchi or rales.  Chest:     Chest wall: No tenderness.  Abdominal:     General: Bowel sounds are normal.     Palpations: Abdomen is soft.  Musculoskeletal:        General: Normal range of motion.     Cervical back: Normal range of motion.  Skin:    General: Skin is warm and dry.  Neurological:     Mental Status: She is alert and oriented to person, place, and time.     Coordination: Coordination normal.  Psychiatric:        Behavior: Behavior normal. Behavior is cooperative.        Thought Content: Thought content normal.        Judgment: Judgment normal.         Patient has been counseled extensively about nutrition and exercise as well as the importance of adherence with medications and regular follow-up. The patient was given clear instructions to go to ER or return to medical center if symptoms don't improve, worsen or new problems develop. The patient verbalized understanding.   Follow-up: Return in about 3 months (around 12/02/2020).   Gildardo Pounds, FNP-BC [facility] [place], [place]   09/01/2020, 4:13 PM

## 2020-09-02 LAB — CBC
Hematocrit: 37.8 % (ref 34.0–46.6)
Hemoglobin: 12.5 g/dL (ref 11.1–15.9)
MCH: 28.9 pg (ref 26.6–33.0)
MCHC: 33.1 g/dL (ref 31.5–35.7)
MCV: 87 fL (ref 79–97)
Platelets: 303 10*3/uL (ref 150–450)
RBC: 4.33 x10E6/uL (ref 3.77–5.28)
RDW: 13.2 % (ref 11.7–15.4)
WBC: 4.7 10*3/uL (ref 3.4–10.8)

## 2020-09-02 LAB — CMP14+EGFR
ALT: 40 IU/L — ABNORMAL HIGH (ref 0–32)
AST: 24 IU/L (ref 0–40)
Albumin/Globulin Ratio: 1.4 (ref 1.2–2.2)
Albumin: 4.3 g/dL (ref 3.8–4.8)
Alkaline Phosphatase: 108 IU/L (ref 44–121)
BUN/Creatinine Ratio: 9 (ref 9–23)
BUN: 5 mg/dL — ABNORMAL LOW (ref 6–24)
Bilirubin Total: 0.4 mg/dL (ref 0.0–1.2)
CO2: 29 mmol/L (ref 20–29)
Calcium: 9.5 mg/dL (ref 8.7–10.2)
Chloride: 101 mmol/L (ref 96–106)
Creatinine, Ser: 0.55 mg/dL — ABNORMAL LOW (ref 0.57–1.00)
Globulin, Total: 3.1 g/dL (ref 1.5–4.5)
Glucose: 101 mg/dL — ABNORMAL HIGH (ref 65–99)
Potassium: 4.4 mmol/L (ref 3.5–5.2)
Sodium: 142 mmol/L (ref 134–144)
Total Protein: 7.4 g/dL (ref 6.0–8.5)
eGFR: 112 mL/min/{1.73_m2} (ref >59)

## 2020-09-02 LAB — HCV INTERPRETATION

## 2020-09-02 LAB — HCV AB W REFLEX TO QUANT PCR: HCV Ab: <0.1 s/co ratio (ref 0.0–0.9)

## 2020-09-02 LAB — LIPASE: Lipase: 16 U/L (ref 14–72)

## 2020-09-08 ENCOUNTER — Other Ambulatory Visit: Payer: Self-pay | Admitting: Family Medicine

## 2020-09-08 DIAGNOSIS — G8929 Other chronic pain: Secondary | ICD-10-CM

## 2020-09-09 NOTE — Telephone Encounter (Signed)
Requested medication (s) are due for refill today: No  Requested medication (s) are on the active medication list: Yes  Last refill:  08/17/20  Future visit scheduled: No  Notes to clinic:  Request for 90 day supply.    Requested Prescriptions  Pending Prescriptions Disp Refills   DULoxetine (CYMBALTA) 60 MG capsule [Pharmacy Med Name: DULOXETINE HCL DR 60 MG CAP] 90 capsule 2    Sig: Take 1 capsule (60 mg total) by mouth daily. For back pain     Psychiatry: Antidepressants - SNRI Passed - 09/08/2020 11:30 AM      Passed - Last BP in normal range    BP Readings from Last 1 Encounters:  09/01/20 128/84          Passed - Valid encounter within last 6 months    Recent Outpatient Visits           1 week ago Type 2 diabetes mellitus with diabetic polyneuropathy, with long-term current use of insulin (HCC)   [place] [facility] [place], [place] W, NP   3 weeks ago Chronic bilateral low back pain with left-sided sciatica   [place] [facility] [place], Odette Horns, MD   2 months ago Type 2 diabetes mellitus with diabetic polyneuropathy, with long-term current use of insulin [facility])   [facility] [place], Shea Stakes, NP   3 months ago Chronic vaginitis   [facility] [place] Matar, MD   3 months ago Acute vaginitis   [facility] [place], Shea Stakes, NP

## 2020-09-13 ENCOUNTER — Other Ambulatory Visit: Payer: Self-pay | Admitting: Nurse Practitioner

## 2020-09-13 DIAGNOSIS — G8929 Other chronic pain: Secondary | ICD-10-CM

## 2020-09-13 NOTE — Telephone Encounter (Signed)
Requested medication (s) are due for refill today: no  Requested medication (s) are on the active medication list: 09/01/20 #60 1 RF  Last refill:  09/01/20 #60 1 RF  Future visit scheduled: no  Notes to clinic:  requested too soon   Requested Prescriptions  Pending Prescriptions Disp Refills   tiZANidine (ZANAFLEX) 4 MG tablet [Pharmacy Med Name: TIZANIDINE HCL 4 MG TABLET] 60 tablet 1    Sig: TAKE 1 TABLET BY MOUTH EVERY 6 HOURS AS NEEDED FOR MUSCLE SPASMS.     Not Delegated - Cardiovascular:  Alpha-2 Agonists - tizanidine Failed - 09/13/2020  9:31 AM      Failed - This refill cannot be delegated      Passed - Valid encounter within last 6 months    Recent Outpatient Visits           1 week ago Type 2 diabetes mellitus with diabetic polyneuropathy, with long-term current use of insulin [facility])   [place] [facility] [place], [place] W, NP   3 weeks ago Chronic bilateral low back pain with left-sided sciatica   [place] [facility] [place], Odette Horns, MD   2 months ago Type 2 diabetes mellitus with diabetic polyneuropathy, with long-term current use of insulin [facility])    [place] [place], Shea Stakes, NP   3 months ago Chronic vaginitis   [facility] Marcine Matar, MD   4 months ago Acute vaginitis   [facility] [place], Shea Stakes, NP

## 2020-09-17 ENCOUNTER — Ambulatory Visit: Payer: 59 | Attending: Family Medicine | Admitting: Physical Therapy

## 2020-09-17 ENCOUNTER — Other Ambulatory Visit: Payer: Self-pay

## 2020-09-17 ENCOUNTER — Encounter: Payer: Self-pay | Admitting: Physical Therapy

## 2020-09-17 DIAGNOSIS — M545 Low back pain, unspecified: Secondary | ICD-10-CM | POA: Insufficient documentation

## 2020-09-17 DIAGNOSIS — G8929 Other chronic pain: Secondary | ICD-10-CM | POA: Insufficient documentation

## 2020-09-17 DIAGNOSIS — R2689 Other abnormalities of gait and mobility: Secondary | ICD-10-CM | POA: Diagnosis present

## 2020-09-17 DIAGNOSIS — M6281 Muscle weakness (generalized): Secondary | ICD-10-CM | POA: Insufficient documentation

## 2020-09-17 NOTE — Therapy (Signed)
[facility] [address] [place], [place], [address] Phone: [phone]   Fax:  [phone]  Physical Therapy Evaluation  Patient Details  Name: Alecia Doi MRN: [id number] Date of Birth: 1970-04-16 Referring Provider (PT): Hoy Register, MD   Encounter Date: 09/17/2020   PT End of Session - 09/17/20 0841     Visit Number 1    Number of Visits 8    Date for PT Re-Evaluation 11/12/20    Authorization Type [employer]    Authorization Time Period FOTO by 6th    PT Start Time 0830    PT Stop Time 0915    PT Time Calculation (min) 45 min    Activity Tolerance Patient tolerated treatment well    Behavior During Therapy [facility] for tasks assessed/performed             Past Medical History:  Diagnosis Date   Anxiety    Asthma    Diabetes mellitus without complication (HCC)    Diverticulitis    GERD (gastroesophageal reflux disease)     Past Surgical History:  Procedure Laterality Date   CARPAL TUNNEL RELEASE Right 04/08/2020   Procedure: RIGHT CARPAL TUNNEL RELEASE;  Surgeon: Tarry Kos, MD;  Location: [facility];  Service: Orthopedics;  Laterality: Right;   CHOLECYSTECTOMY     COLON SURGERY      There were no vitals filed for this visit.    Subjective Assessment - 09/17/20 0831     Subjective Patient reports she is having some constant left sided low back, hip and leg pain that just won't away. She feels she has been limping more often and has become more normal. The constant pain has been going on for a couple months, but she has been having a neuopathy for a couple years. She feels she was working more and this may have aggravated the pain. Pain seems to be aggravated by everything, nothing specific. She is on her feet a lot working at [employer] and she has trouble sitting or sleeping comfortably. She has back off on work and the pain is not as bad but still is constantly there.    Limitations  Sitting;Lifting;Standing;Walking;House hold activities    How long can you sit comfortably? 5-10 minutes    How long can you stand comfortably? "can stand as long as she is moving"    How long can you walk comfortably? "no limitation"    Patient Stated Goals Get rid of pain    Currently in Pain? Yes    Pain Score 7     Pain Location Back    Pain Orientation Left;Lower    Pain Descriptors / Indicators Sharp;Stabbing    Pain Type Chronic pain    Pain Radiating Towards left lower back, gluteal region, and back of left leg    Pain Onset More than a month ago    Pain Frequency Constant    Aggravating Factors  General movement, sitting, sleeping, standing    Pain Relieving Factors Medication, massage, hot tub                [facility] PT Assessment - 09/17/20 0001       Assessment   Medical Diagnosis Chronic bilateral low back pain with left-sided sciatica    Referring Provider (PT) Hoy Register, MD    Onset Date/Surgical Date --   ongoing for > 4 months   Next MD Visit Not scheduled    Prior Therapy No  Precautions   Precautions None      Restrictions   Weight Bearing Restrictions No      Balance Screen   Has the patient fallen in the past 6 months No    Has the patient had a decrease in activity level because of a fear of falling?  No    Is the patient reluctant to leave their home because of a fear of falling?  No      Home Environment   Living Environment Private residence    Type of Home Apartment    Home Access Stairs to enter    Entrance Stairs-Number of Steps 15-20      Prior Function   Level of Independence Independent      Cognition   Overall Cognitive Status Within Functional Limits for tasks assessed      Observation/Other Assessments   Observations Patient appears in no apparent distress    Focus on Therapeutic Outcomes (FOTO)  56% functional status      Sensation   Light Touch Appears Intact      Coordination   Gross Motor Movements are Fluid  and Coordinated Yes      ROM / Strength   AROM / PROM / Strength AROM;Strength;PROM      AROM   AROM Assessment Site Lumbar    Lumbar Flexion WFL    Lumbar Extension WFL    Lumbar - Right Side Bend 75% - left sided low back pulling    Lumbar - Left Side Bend 75% - left sided low back pulling    Lumbar - Right Rotation 75% - left sided low back pulling    Lumbar - Left Rotation WFL      PROM   Overall PROM Comments Hip PROM grossly WFL and non-painful      Strength   Overall Strength Comments Core strength deficit    Strength Assessment Site Hip;Knee    Right/Left Hip Right;Left    Right Hip Flexion 4/5    Right Hip Extension 3-/5    Right Hip ABduction 4-/5    Left Hip Flexion 4/5    Left Hip Extension 3-/5    Left Hip ABduction 3/5    Right/Left Knee Right;Left    Right Knee Flexion 5/5    Right Knee Extension 5/5    Left Knee Flexion 5/5    Left Knee Extension 5/5      Flexibility   Soft Tissue Assessment /Muscle Length yes    Hamstrings Slight limitation    Piriformis Slight limitation      Palpation   Spinal mobility WFL    Palpation comment TTP left lumbar paraspinals/QL      Special Tests   Other special tests Slump negative      Transfers   Transfers Independent with all Transfers      Ambulation/Gait   Ambulation/Gait Yes    Ambulation/Gait Assistance 7: Independent    Gait Comments Slight antlagia on left                        Objective measurements completed on examination: See above findings.       [facility] Adult PT Treatment/Exercise - 09/17/20 0001       Exercises   Exercises Lumbar      Lumbar Exercises: Stretches   Active Hamstring Stretch 5 reps   5 sec hold   Lower Trunk Rotation 5 reps;10 seconds    Piriformis Stretch 2 reps;20 seconds  Other Lumbar Stretch Exercise Child's pose 2 x 20 sec      Lumbar Exercises: Supine   Bridge 10 reps;3 seconds      Lumbar Exercises: Sidelying   Clam 10 reps    Clam  Limitations yellow                     PT Education - 09/17/20 0828     Education Details Exam findings, POC, HEP    Person(s) Educated Patient    Methods Explanation;Demonstration;Tactile cues;Verbal cues;Handout    Comprehension Verbalized understanding;Returned demonstration;Verbal cues required;Tactile cues required;Need further instruction              PT Short Term Goals - 09/17/20 0841       PT SHORT TERM GOAL #1   Title Patient will be I with initial HEP to progress with PT    Time 4    Period Weeks    Status New    Target Date 10/15/20      PT SHORT TERM GOAL #2   Title PT will review FOTO with patient by 3rd visit to understand expected progress    Time 3    Period Weeks    Status New    Target Date 10/08/20      PT SHORT TERM GOAL #3   Title Patient will demonstrate lumbar AROM grossly WFL and non painful to improve lifting ability at work    Time 4    Period Weeks    Status New    Target Date 10/15/20      PT SHORT TERM GOAL #4   Title Patient will demonstrate ambulation without antalgic gait to improve community mobility    Time 4    Period Weeks    Status New    Target Date 10/15/20               PT Long Term Goals - 09/17/20 0842       PT LONG TERM GOAL #1   Title Patient will be I with final HEP to maintain progress from PT    Time 8    Period Weeks    Status New    Target Date 11/12/20      PT LONG TERM GOAL #2   Title Patient will be able to sit >/= 30 minutes without increase in pain to reduce functional limitation    Time 8    Period Weeks    Status New    Target Date 11/12/20      PT LONG TERM GOAL #3   Title Patient will exhibit core and hip strength grossly >/= 4/5 MMT in order to reduce pain and improve tolerance for standing at work    Time 8    Period Weeks    Status New    Target Date 11/12/20      PT LONG TERM GOAL #4   Title Patient will report improved functional status >/= 64% on FOTO to indicate  improve functonal ability    Time 8    Period Weeks    Status New    Target Date 11/12/20                    Plan - 09/17/20 0844     Clinical Impression Statement Patient presents to PT with report of chronic left sided lower back, hip, and leg pain that is constant and aggravated with general movement or activity. She does demonstrate tightness left  QL region and tenderness with palpation, limitation in active lumbar motion with sidebend and rotation reporting increase left sided pulling, generalized core and hip weakness. Patient was provided exercises to initiate stretching and strengthening program and she would benefit from continued skilled PT to progress her mobility and strength in order to reduce pain, normalize gait, and maximize her functional ability.    Personal Factors and Comorbidities Fitness;Past/Current Experience;Time since onset of injury/illness/exacerbation    Examination-Activity Limitations Locomotion Level;Sit;Sleep;Stand;Lift    Examination-Participation Restrictions Occupation;Community Activity;Shop;Laundry;Driving    Stability/Clinical Decision Making Stable/Uncomplicated    Clinical Decision Making Low    Rehab Potential Good    PT Frequency 1x / week    PT Duration 8 weeks    PT Treatment/Interventions ADLs/Self Care Home Management;Aquatic Therapy;Cryotherapy;Electrical Stimulation;Iontophoresis 4mg /ml Dexamethasone;Moist Heat;Traction;Ultrasound;Neuromuscular re-education;Balance training;Therapeutic exercise;Therapeutic activities;Functional mobility training;Stair training;Gait training;Patient/family education;Manual techniques;Dry needling;Passive range of motion;Taping;Vasopneumatic Device;Spinal Manipulations;Joint Manipulations    PT Next Visit Plan Review HEP and progress PRN, review FOTO by 3rd visit, manual for left lumbar paraspinals, progress core and hip strengthening    PT Home Exercise Plan    Consulted and Agree with Plan of  Care Patient             Patient will benefit from skilled therapeutic intervention in order to improve the following deficits and impairments:  Abnormal gait, Decreased range of motion, Pain, Decreased activity tolerance, Decreased strength  Visit Diagnosis: Chronic left-sided low back pain, unspecified whether sciatica present  Muscle weakness (generalized)  Other abnormalities of gait and mobility     Problem List Patient Active Problem List   Diagnosis Date Noted   Carpal tunnel syndrome on right 04/07/2020   DM2 (diabetes mellitus, type 2) (HCC) 10/30/2019   Influenza vaccine needed 10/30/2019    11/01/2019, PT, DPT, LAT, ATC 09/17/20  10:27 AM Phone: [phone] Fax: [phone]   [facility] [address] [place], [place], [place] Phone: [phone]   Fax:  [phone]  Name: Niyla Marone MRN: [name] Date of Birth: [birth year]

## 2020-09-17 NOTE — Patient Instructions (Signed)
Access Code: [email or web address] URL: [email or web address] Date: 09/17/2020 Prepared by: Rosana Hoes  Exercises Supine Lower Trunk Rotation - 2 x daily - 7 x weekly - 5 reps - 10 hold Supine Piriformis Stretch with Foot on Ground - 2 x daily - 7 x weekly - 3 reps - 20 hold Hooklying Active Hamstring Stretch - 2 x daily - 7 x weekly - 5 reps - 5 hold Bridge - 2 x daily - 7 x weekly - 2 sets - 10 reps - 3 hold Clam with Resistance - 2 x daily - 7 x weekly - 2 sets - 10 reps Child's Pose Stretch - 2 x daily - 7 x weekly - 3 reps - 20 hold

## 2020-09-22 ENCOUNTER — Other Ambulatory Visit: Payer: Self-pay

## 2020-09-22 ENCOUNTER — Ambulatory Visit: Payer: 59 | Admitting: Physical Therapy

## 2020-09-22 ENCOUNTER — Encounter: Payer: Self-pay | Admitting: Physical Therapy

## 2020-09-22 DIAGNOSIS — M545 Low back pain, unspecified: Secondary | ICD-10-CM

## 2020-09-22 DIAGNOSIS — R2689 Other abnormalities of gait and mobility: Secondary | ICD-10-CM

## 2020-09-22 DIAGNOSIS — G8929 Other chronic pain: Secondary | ICD-10-CM

## 2020-09-22 DIAGNOSIS — M6281 Muscle weakness (generalized): Secondary | ICD-10-CM

## 2020-09-22 NOTE — Therapy (Signed)
[facility] [address] [place], [place], [address] Phone: [phone]   Fax:  [phone]  Physical Therapy Treatment  Patient Details  Name: Denise Hale MRN: [id number] Date of Birth: Aug 21, 1970 Referring Provider (PT): Hoy Register, MD   Encounter Date: 09/22/2020   PT End of Session - 09/22/20 1144     Visit Number 2    Number of Visits 8    Date for PT Re-Evaluation 11/12/20    Authorization Type [employer]    Authorization Time Period FOTO by 6th    PT Start Time 1137    PT Stop Time 1215    PT Time Calculation (min) 38 min    Activity Tolerance Patient tolerated treatment well    Behavior During Therapy [facility] for tasks assessed/performed             Past Medical History:  Diagnosis Date   Anxiety    Asthma    Diabetes mellitus without complication (HCC)    Diverticulitis    GERD (gastroesophageal reflux disease)     Past Surgical History:  Procedure Laterality Date   CARPAL TUNNEL RELEASE Right 04/08/2020   Procedure: RIGHT CARPAL TUNNEL RELEASE;  Surgeon: Tarry Kos, MD;  Location: [facility];  Service: Orthopedics;  Laterality: Right;   CHOLECYSTECTOMY     COLON SURGERY      There were no vitals filed for this visit.   Subjective Assessment - 09/22/20 1143     Subjective Patient reports she is doing well, the exercises are doing fine. States her left lower back/hip/leg are better, she is still having a little issue but has not taken any medication this morning.    Patient Stated Goals Get rid of pain    Currently in Pain? Yes    Pain Score 4     Pain Location Back    Pain Orientation Left;Lower    Pain Descriptors / Indicators Sharp;Aching    Pain Type Chronic pain    Pain Onset More than a month ago    Pain Frequency Constant                [facility] PT Assessment - 09/22/20 0001       Strength   Left Hip ABduction 3+/5      Palpation   Palpation comment TTP and  increased muscle tension left lumbar paraspinals/QL                           [facility] Adult PT Treatment/Exercise - 09/22/20 0001       Exercises   Exercises Lumbar            [facility] Adult PT Treatment/Exercise:  Therapeutic Exercise: - NuStep L5 x 5 min with LE while taking subjective - LTR 3 x 10 sec - Piriformis stretch 2 x 20 sec each - Child's pose x 20 sec fwd, x 20 sec with left side bias - Doorway QL stretch on left 2 x 20 sec - Bridge x 10 with 3 sec hold - Side plank on knees 5 x 10 sec each - Clamshell with red 2 x 10 (left only) - Instructed patient on SMFR using tennis ball at wall for left QL region  Manual Therapy: - STM to left QL with patient in sidelying x 8 min  Neuromuscular re-ed: - NA  Therapeutic Activity: - NA          PT Education -  09/22/20 1144     Education Details HEP, FOTO    Person(s) Educated Patient    Methods Demonstration;Explanation;Verbal cues    Comprehension Verbalized understanding;Returned demonstration;Verbal cues required;Need further instruction              PT Short Term Goals - 09/17/20 0841       PT SHORT TERM GOAL #1   Title Patient will be I with initial HEP to progress with PT    Time 4    Period Weeks    Status New    Target Date 10/15/20      PT SHORT TERM GOAL #2   Title PT will review FOTO with patient by 3rd visit to understand expected progress    Time 3    Period Weeks    Status New    Target Date 10/08/20      PT SHORT TERM GOAL #3   Title Patient will demonstrate lumbar AROM grossly WFL and non painful to improve lifting ability at work    Time 4    Period Weeks    Status New    Target Date 10/15/20      PT SHORT TERM GOAL #4   Title Patient will demonstrate ambulation without antalgic gait to improve community mobility    Time 4    Period Weeks    Status New    Target Date 10/15/20               PT Long Term Goals - 09/17/20 0842       PT LONG TERM  GOAL #1   Title Patient will be I with final HEP to maintain progress from PT    Time 8    Period Weeks    Status New    Target Date 11/12/20      PT LONG TERM GOAL #2   Title Patient will be able to sit >/= 30 minutes without increase in pain to reduce functional limitation    Time 8    Period Weeks    Status New    Target Date 11/12/20      PT LONG TERM GOAL #3   Title Patient will exhibit core and hip strength grossly >/= 4/5 MMT in order to reduce pain and improve tolerance for standing at work    Time 8    Period Weeks    Status New    Target Date 11/12/20      PT LONG TERM GOAL #4   Title Patient will report improved functional status >/= 64% on FOTO to indicate improve functonal ability    Time 8    Period Weeks    Status New    Target Date 11/12/20                   Plan - 09/22/20 1146     Clinical Impression Statement Patient tolerated therapy well with no adverse effects. Performed manual therapy for left QL region and patient reported feeling better so instructed her in using tennis ball for home and followed up with stretching. Progressed core and hip strengthening this visit with good tolerance. No increased pain reported with therapy. Patient would benefit from continued skilled PT to progress her mobility and strength in order to reduce pain, normalize gait, and maximize her functional ability.    PT Treatment/Interventions ADLs/Self Care Home Management;Aquatic Therapy;Cryotherapy;Electrical Stimulation;Iontophoresis 4mg /ml Dexamethasone;Moist Heat;Traction;Ultrasound;Neuromuscular re-education;Balance training;Therapeutic exercise;Therapeutic activities;Functional mobility training;Stair training;Gait training;Patient/family education;Manual techniques;Dry needling;Passive range of motion;Taping;Vasopneumatic Device;Spinal  Manipulations;Joint Manipulations    PT Next Visit Plan Review HEP and progress PRN, manual for left lumbar paraspinals, progress core  and hip strengthening    PT Home Exercise Plan [email or web address]    Consulted and Agree with Plan of Care Patient             Patient will benefit from skilled therapeutic intervention in order to improve the following deficits and impairments:  Abnormal gait, Decreased range of motion, Pain, Decreased activity tolerance, Decreased strength  Visit Diagnosis: Chronic left-sided low back pain, unspecified whether sciatica present  Muscle weakness (generalized)  Other abnormalities of gait and mobility     Problem List Patient Active Problem List   Diagnosis Date Noted   Carpal tunnel syndrome on right 04/07/2020   DM2 (diabetes mellitus, type 2) (HCC) 10/30/2019   Influenza vaccine needed 10/30/2019    Rosana Hoes, PT, DPT, LAT, ATC 09/22/20  12:17 PM Phone: [phone] Fax: [phone]   [facility] [address] [place], [place], [address] Phone: [phone]   Fax:  [phone]  Name: Denise Hale MRN: [id number] Date of Birth: [birth year]

## 2020-09-29 ENCOUNTER — Ambulatory Visit: Payer: 59 | Admitting: Physical Therapy

## 2020-09-29 ENCOUNTER — Other Ambulatory Visit: Payer: Self-pay

## 2020-09-29 ENCOUNTER — Encounter: Payer: Self-pay | Admitting: Physical Therapy

## 2020-09-29 DIAGNOSIS — M545 Low back pain, unspecified: Secondary | ICD-10-CM

## 2020-09-29 DIAGNOSIS — M6281 Muscle weakness (generalized): Secondary | ICD-10-CM

## 2020-09-29 DIAGNOSIS — R2689 Other abnormalities of gait and mobility: Secondary | ICD-10-CM

## 2020-09-29 DIAGNOSIS — G8929 Other chronic pain: Secondary | ICD-10-CM

## 2020-09-29 NOTE — Therapy (Signed)
[facility] [address] [place], [place], [address] Phone: [phone]   Fax:  [phone]  Physical Therapy Treatment  Patient Details  Name: Denise Hale MRN: [id number] Date of Birth: Jan 24, 1970 Referring Provider (PT): Hoy Register, MD   Encounter Date: 09/29/2020   PT End of Session - 09/29/20 1226     Visit Number 3    Number of Visits 8    Date for PT Re-Evaluation 11/12/20    Authorization Type [employer]    Authorization Time Period FOTO by 6th    PT Start Time 1215    PT Stop Time 1255    PT Time Calculation (min) 40 min    Activity Tolerance Patient tolerated treatment well    Behavior During Therapy [facility] for tasks assessed/performed             Past Medical History:  Diagnosis Date   Anxiety    Asthma    Diabetes mellitus without complication (HCC)    Diverticulitis    GERD (gastroesophageal reflux disease)     Past Surgical History:  Procedure Laterality Date   CARPAL TUNNEL RELEASE Right 04/08/2020   Procedure: RIGHT CARPAL TUNNEL RELEASE;  Surgeon: Tarry Kos, MD;  Location: [facility];  Service: Orthopedics;  Laterality: Right;   CHOLECYSTECTOMY     COLON SURGERY      There were no vitals filed for this visit.   Subjective Assessment - 09/29/20 1223     Subjective Patient reports she is doing well, states no new issues. She continues to work on her exercises consistently. She states that she typically doesn't feel her lower back tul she stops moving at night.    Patient Stated Goals Get rid of pain    Currently in Pain? Yes    Pain Score 4     Pain Location Back    Pain Orientation Left;Lower    Pain Descriptors / Indicators Aching;Tightness    Pain Type Chronic pain    Pain Onset More than a month ago    Pain Frequency Constant                [facility] PT Assessment - 09/29/20 0001       AROM   Lumbar - Right Side Bend 75% - left sided low back pulling       Ambulation/Gait   Gait Comments Slight antlagia on left                [facility] Adult PT Treatment/Exercise:   Therapeutic Exercise: - NuStep L5 x 5 min with LE while taking subjective - LTR 5 x 10 sec in hooklying figure-4 position - Piriformis stretch 2 x 20 sec each - Child's pose 2 x 20 sec with left side bias - Bird dog 2 x 10 with 5 sec hold - Side plank on knees 5 x 15 sec each - Bridge 2 x 10 with 3 sec hold    Manual Therapy: - STM to left QL with patient in sidelying x 8 min   Neuromuscular re-ed: - NA   Therapeutic Activity: - NA         PT Education - 09/29/20 1226     Education Details HEP update    Person(s) Educated Patient    Methods Explanation;Demonstration;Verbal cues;Handout    Comprehension Verbalized understanding;Returned demonstration;Verbal cues required;Need further instruction              PT Short Term Goals - 09/29/20  1254       PT SHORT TERM GOAL #1   Title Patient will be I with initial HEP to progress with PT    Baseline progressing HEP    Time 4    Period Weeks    Status On-going    Target Date 10/15/20      PT SHORT TERM GOAL #2   Title PT will review FOTO with patient by 3rd visit to understand expected progress    Baseline reviewed 2nd visit    Time 3    Period Weeks    Status Achieved    Target Date 10/08/20      PT SHORT TERM GOAL #3   Title Patient will demonstrate lumbar AROM grossly WFL and non painful to improve lifting ability at work    Time 4    Period Weeks    Status On-going    Target Date 10/15/20      PT SHORT TERM GOAL #4   Title Patient will demonstrate ambulation without antalgic gait to improve community mobility    Baseline slightly antalgic on left    Time 4    Period Weeks    Status On-going    Target Date 10/15/20               PT Long Term Goals - 09/17/20 0842       PT LONG TERM GOAL #1   Title Patient will be I with final HEP to maintain progress from PT    Time  8    Period Weeks    Status New    Target Date 11/12/20      PT LONG TERM GOAL #2   Title Patient will be able to sit >/= 30 minutes without increase in pain to reduce functional limitation    Time 8    Period Weeks    Status New    Target Date 11/12/20      PT LONG TERM GOAL #3   Title Patient will exhibit core and hip strength grossly >/= 4/5 MMT in order to reduce pain and improve tolerance for standing at work    Time 8    Period Weeks    Status New    Target Date 11/12/20      PT LONG TERM GOAL #4   Title Patient will report improved functional status >/= 64% on FOTO to indicate improve functonal ability    Time 8    Period Weeks    Status New    Target Date 11/12/20                   Plan - 09/29/20 1227     Clinical Impression Statement Patient tolerated therapy well with no adverse effects. Continued with STM followed by stretching for left lower back with good benefit. Patient able to tolerated progression of core strengthening well but did exhibit difficult with control and required cueing for proper technique. Updated HEP to progress core strengthening. Patient would benefit from continued skilled PT to progress her mobility and strength in order to reduce pain, normalize gait, and maximize her functional ability.    PT Treatment/Interventions ADLs/Self Care Home Management;Aquatic Therapy;Cryotherapy;Electrical Stimulation;Iontophoresis 4mg /ml Dexamethasone;Moist Heat;Traction;Ultrasound;Neuromuscular re-education;Balance training;Therapeutic exercise;Therapeutic activities;Functional mobility training;Stair training;Gait training;Patient/family education;Manual techniques;Dry needling;Passive range of motion;Taping;Vasopneumatic Device;Spinal Manipulations;Joint Manipulations    PT Next Visit Plan Review HEP and progress PRN, manual for left lumbar paraspinals, progress core and hip strengthening    PT Home Exercise Plan  Consulted and Agree with  Plan of Care Patient             Patient will benefit from skilled therapeutic intervention in order to improve the following deficits and impairments:  Abnormal gait, Decreased range of motion, Pain, Decreased activity tolerance, Decreased strength  Visit Diagnosis: Chronic left-sided low back pain, unspecified whether sciatica present  Muscle weakness (generalized)  Other abnormalities of gait and mobility     Problem List Patient Active Problem List   Diagnosis Date Noted   Carpal tunnel syndrome on right 04/07/2020   DM2 (diabetes mellitus, type 2) (HCC) 10/30/2019   Influenza vaccine needed 10/30/2019    Rosana Hoes, PT, DPT, LAT, ATC 09/29/20  1:01 PM Phone: [phone] Fax: [phone]   [facility] [address] [place], [place], [address] Phone: [phone]   Fax:  [phone]  Name: Denise Hale MRN: [id number] Date of Birth: [birth year]

## 2020-09-29 NOTE — Patient Instructions (Signed)
Access Code: [email or web address] URL: [email or web address] Date: 09/29/2020 Prepared by: Rosana Hoes  Exercises Supine Lower Trunk Rotation - 2 x daily - 7 x weekly - 5 reps - 10 hold Supine Piriformis Stretch with Foot on Ground - 2 x daily - 7 x weekly - 3 reps - 20 hold Hooklying Active Hamstring Stretch - 2 x daily - 7 x weekly - 5 reps - 5 hold Bridge - 2 x daily - 7 x weekly - 2 sets - 10 reps - 3 hold Clam with Resistance - 2 x daily - 7 x weekly - 2 sets - 10 reps Child's Pose Stretch - 2 x daily - 7 x weekly - 3 reps - 20 hold Bird Dog - 2 x daily - 7 x weekly - 2 sets - 10 reps - 5 hold Side Plank on Knees - 2 x daily - 7 x weekly - 5 reps - 10 hold Standing Paraspinals Mobilization with Small Ball on Wall - 7 x weekly

## 2020-10-06 ENCOUNTER — Encounter: Payer: Self-pay | Admitting: Physical Therapy

## 2020-10-06 ENCOUNTER — Other Ambulatory Visit: Payer: Self-pay

## 2020-10-06 ENCOUNTER — Ambulatory Visit: Payer: 59 | Attending: Family Medicine | Admitting: Physical Therapy

## 2020-10-06 DIAGNOSIS — M545 Low back pain, unspecified: Secondary | ICD-10-CM | POA: Insufficient documentation

## 2020-10-06 DIAGNOSIS — R2689 Other abnormalities of gait and mobility: Secondary | ICD-10-CM | POA: Insufficient documentation

## 2020-10-06 DIAGNOSIS — M6281 Muscle weakness (generalized): Secondary | ICD-10-CM | POA: Insufficient documentation

## 2020-10-06 DIAGNOSIS — G8929 Other chronic pain: Secondary | ICD-10-CM | POA: Insufficient documentation

## 2020-10-06 NOTE — Therapy (Signed)
[facility] [address] [place], [place], [address] Phone: [phone]   Fax:  [phone]  Physical Therapy Treatment  Patient Details  Name: Denise Hale MRN: [id number] Date of Birth: [birth year] Referring Provider (PT): Hoy Register, MD   Encounter Date: 10/06/2020   PT End of Session - 10/06/20 1222     Visit Number 4    Number of Visits 8    Date for PT Re-Evaluation 11/12/20    Authorization Type [employer]    PT Start Time 1215    PT Stop Time 1255    PT Time Calculation (min) 40 min    Activity Tolerance Patient tolerated treatment well    Behavior During Therapy [facility] for tasks assessed/performed             Past Medical History:  Diagnosis Date   Anxiety    Asthma    Diabetes mellitus without complication (HCC)    Diverticulitis    GERD (gastroesophageal reflux disease)     Past Surgical History:  Procedure Laterality Date   CARPAL TUNNEL RELEASE Right 04/08/2020   Procedure: RIGHT CARPAL TUNNEL RELEASE;  Surgeon: Tarry Kos, MD;  Location: [facility];  Service: Orthopedics;  Laterality: Right;   CHOLECYSTECTOMY     COLON SURGERY      There were no vitals filed for this visit.   Subjective Assessment - 10/06/20 1220     Subjective Patient reports she is doing well, no new issues reported. States the exercises are going good. She do report she is having some pain in the left lower back today.    Patient Stated Goals Get rid of pain    Currently in Pain? Yes    Pain Score 5     Pain Location Back    Pain Orientation Left;Lower    Pain Descriptors / Indicators Aching;Tightness    Pain Type Chronic pain    Pain Onset More than a month ago    Pain Frequency Constant    Aggravating Factors  General movement, sitting, sleeping, standing                [facility] PT Assessment - 10/06/20 0001       Strength   Right Hip ABduction 4-/5    Left Hip ABduction 3+/5                        [facility] Adult PT Treatment/Exercise:   Therapeutic Exercise: - NuStep L5 x 5 min with LE while taking subjective - LTR 5 x 10 sec in hooklying figure-4 position - Child's pose x 20 sec fwd, 2 x 20 sec with left side bias - Sidelying clamshell with red 2 x 10 each - Bridge with red around knees x 10 with 3 sec hold - Side plank on knees 3 x 15 sec each - Deadlift 15# from 8" box 2 x 10    Manual Therapy: - NA   Neuromuscular re-ed: - NA   Therapeutic Activity: - NA             PT Education - 10/06/20 1222     Education Details HEP    Person(s) Educated Patient    Methods Explanation;Demonstration;Verbal cues    Comprehension Verbalized understanding;Returned demonstration;Verbal cues required;Need further instruction              PT Short Term Goals - 09/29/20 1254       PT SHORT TERM  GOAL #1   Title Patient will be I with initial HEP to progress with PT    Baseline progressing HEP    Time 4    Period Weeks    Status On-going    Target Date 10/15/20      PT SHORT TERM GOAL #2   Title PT will review FOTO with patient by 3rd visit to understand expected progress    Baseline reviewed 2nd visit    Time 3    Period Weeks    Status Achieved    Target Date 10/08/20      PT SHORT TERM GOAL #3   Title Patient will demonstrate lumbar AROM grossly WFL and non painful to improve lifting ability at work    Time 4    Period Weeks    Status On-going    Target Date 10/15/20      PT SHORT TERM GOAL #4   Title Patient will demonstrate ambulation without antalgic gait to improve community mobility    Baseline slightly antalgic on left    Time 4    Period Weeks    Status On-going    Target Date 10/15/20               PT Long Term Goals - 09/17/20 0842       PT LONG TERM GOAL #1   Title Patient will be I with final HEP to maintain progress from PT    Time 8    Period Weeks    Status New    Target Date 11/12/20      PT  LONG TERM GOAL #2   Title Patient will be able to sit >/= 30 minutes without increase in pain to reduce functional limitation    Time 8    Period Weeks    Status New    Target Date 11/12/20      PT LONG TERM GOAL #3   Title Patient will exhibit core and hip strength grossly >/= 4/5 MMT in order to reduce pain and improve tolerance for standing at work    Time 8    Period Weeks    Status New    Target Date 11/12/20      PT LONG TERM GOAL #4   Title Patient will report improved functional status >/= 64% on FOTO to indicate improve functonal ability    Time 8    Period Weeks    Status New    Target Date 11/12/20                   Plan - 10/06/20 1223     Clinical Impression Statement Patient tolerated therapy well with no adverse effects. She continues to report left sided low back pain. Therapy focused primarily on stretching for lower back and progressing core and gluteal strength. She continues to demonstrate strength deficit but was able to tolerate progressing to lifting this visit. No change made to HEP this visit. Patient would benefit from continued skilled PT to progress her mobility and strength in order to reduce pain, normalize gait, and maximize her functional ability.    PT Treatment/Interventions ADLs/Self Care Home Management;Aquatic Therapy;Cryotherapy;Electrical Stimulation;Iontophoresis 4mg /ml Dexamethasone;Moist Heat;Traction;Ultrasound;Neuromuscular re-education;Balance training;Therapeutic exercise;Therapeutic activities;Functional mobility training;Stair training;Gait training;Patient/family education;Manual techniques;Dry needling;Passive range of motion;Taping;Vasopneumatic Device;Spinal Manipulations;Joint Manipulations    PT Next Visit Plan Review HEP and progress PRN, manual for left lumbar paraspinals, progress core and hip strengthening    PT Home Exercise Plan [id number]    Consulted and  Agree with Plan of Care Patient             Patient will  benefit from skilled therapeutic intervention in order to improve the following deficits and impairments:  Abnormal gait, Decreased range of motion, Pain, Decreased activity tolerance, Decreased strength  Visit Diagnosis: Chronic left-sided low back pain, unspecified whether sciatica present  Muscle weakness (generalized)  Other abnormalities of gait and mobility     Problem List Patient Active Problem List   Diagnosis Date Noted   Carpal tunnel syndrome on right 04/07/2020   DM2 (diabetes mellitus, type 2) (HCC) 10/30/2019   Influenza vaccine needed 10/30/2019    Rosana Hoes, PT, DPT, LAT, ATC 10/06/20  1:02 PM Phone: [phone] Fax: [phone]   [facility] Outpatient Rehabilitation [facility] [address] [place], [place], [address] Phone: [phone]   Fax:  [phone]  Name: Denise Hale MRN: [id number] Date of Birth: [birth year]

## 2020-10-13 ENCOUNTER — Ambulatory Visit: Payer: 59 | Admitting: Physical Therapy

## 2020-10-22 ENCOUNTER — Ambulatory Visit: Payer: 59 | Admitting: Physical Therapy

## 2020-10-22 ENCOUNTER — Encounter: Payer: Self-pay | Admitting: Physical Therapy

## 2020-10-22 ENCOUNTER — Other Ambulatory Visit: Payer: Self-pay

## 2020-10-22 DIAGNOSIS — M6281 Muscle weakness (generalized): Secondary | ICD-10-CM

## 2020-10-22 DIAGNOSIS — M545 Low back pain, unspecified: Secondary | ICD-10-CM | POA: Diagnosis not present

## 2020-10-22 DIAGNOSIS — G8929 Other chronic pain: Secondary | ICD-10-CM

## 2020-10-22 DIAGNOSIS — R2689 Other abnormalities of gait and mobility: Secondary | ICD-10-CM

## 2020-10-22 NOTE — Therapy (Signed)
[facility] Out[facility] [address] [place], [place], [address] Phone: [phone]   Fax:  [phone]  Physical Therapy Treatment  Patient Details  Name: Denise Hale MRN: [id number] Date of Birth: [birth year] Referring Provider (PT): Hoy Register, MD   Encounter Date: 10/22/2020   PT End of Session - 10/22/20 1052     Visit Number 5    Number of Visits 8    Date for PT Re-Evaluation 11/12/20    Authorization Type [employer]    Authorization Time Period FOTO by 6th    PT Start Time 1045    PT Stop Time 1130    PT Time Calculation (min) 45 min    Activity Tolerance Patient tolerated treatment well    Behavior During Therapy [facility] for tasks assessed/performed             Past Medical History:  Diagnosis Date   Anxiety    Asthma    Diabetes mellitus without complication (HCC)    Diverticulitis    GERD (gastroesophageal reflux disease)     Past Surgical History:  Procedure Laterality Date   CARPAL TUNNEL RELEASE Right 04/08/2020   Procedure: RIGHT CARPAL TUNNEL RELEASE;  Surgeon: Tarry Kos, MD;  Location: [facility];  Service: Orthopedics;  Laterality: Right;   CHOLECYSTECTOMY     COLON SURGERY      There were no vitals filed for this visit.   Subjective Assessment - 10/22/20 1050     Subjective Patient reports she fell at work because hse tripped over her shoestring, she was too sore and had to cancel last weeks appointment. She is feeling better now. She was able to do the exercises a little bit.    Patient Stated Goals Get rid of pain    Currently in Pain? Yes    Pain Score 5     Pain Location Back    Pain Orientation Left;Lower    Pain Descriptors / Indicators Aching;Tightness    Pain Type Chronic pain    Pain Onset More than a month ago    Pain Frequency Constant    Aggravating Factors  General movement, sitting, sleeping, standing                [facility] PT Assessment - 10/22/20 0001        AROM   Overall AROM Comments Lumbar motion gross WFL but continues to report left sided low back tightness                      [facility] Adult PT Treatment/Exercise:   Therapeutic Exercise: - NuStep L5 x 5 min with LE while taking subjective - LTR 5 x 10 sec in hooklying figure-4 position - Piriformis stretch 2 x 20 sec bilat - Child's pose x 20 sec fwd, 2 x 20 sec with left side bias - Modified thomas stretch with overpressure 2 x 30 sec  - Bridge x 10 with 5 sec hold - Deadlift 10# from 8" box 2 x 15 - Row with blue 2 x 10 - Pallof press with blue 2 x 10 each   Manual Therapy: - NA   Neuromuscular re-ed: - NA   Therapeutic Activity: - NA              PT Education - 10/22/20 1051     Education Details HEP    Person(s) Educated Patient    Methods Explanation;Demonstration;Verbal cues    Comprehension Verbalized understanding;Returned demonstration;Verbal  cues required;Need further instruction              PT Short Term Goals - 10/22/20 1205       PT SHORT TERM GOAL #1   Title Patient will be I with initial HEP to progress with PT    Baseline progressing HEP    Time 4    Period Weeks    Status On-going    Target Date 10/15/20      PT SHORT TERM GOAL #2   Title PT will review FOTO with patient by 3rd visit to understand expected progress    Baseline reviewed 2nd visit    Time 3    Period Weeks    Status Achieved    Target Date 10/08/20      PT SHORT TERM GOAL #3   Title Patient will demonstrate lumbar AROM grossly WFL and non painful to improve lifting ability at work    Baseline lumbar motion grossly WFL but continues with left side tightness    Time 4    Period Weeks    Status On-going    Target Date 10/15/20      PT SHORT TERM GOAL #4   Title Patient will demonstrate ambulation without antalgic gait to improve community mobility    Baseline slightly antalgic on left    Time 4    Period Weeks    Status On-going     Target Date 10/15/20               PT Long Term Goals - 09/17/20 0842       PT LONG TERM GOAL #1   Title Patient will be I with final HEP to maintain progress from PT    Time 8    Period Weeks    Status New    Target Date 11/12/20      PT LONG TERM GOAL #2   Title Patient will be able to sit >/= 30 minutes without increase in pain to reduce functional limitation    Time 8    Period Weeks    Status New    Target Date 11/12/20      PT LONG TERM GOAL #3   Title Patient will exhibit core and hip strength grossly >/= 4/5 MMT in order to reduce pain and improve tolerance for standing at work    Time 8    Period Weeks    Status New    Target Date 11/12/20      PT LONG TERM GOAL #4   Title Patient will report improved functional status >/= 64% on FOTO to indicate improve functonal ability    Time 8    Period Weeks    Status New    Target Date 11/12/20                   Plan - 10/22/20 1052     Clinical Impression Statement Patient tolerated therapy well with no adverse effects. Therapy focused on continued progression of stretching/mobility and strengthening. Incorporated further standing strengthening exercises with good tolerance. She does demonstrate improved lumbar motion this visit but continues to report left side tightness with movement and slight antalgic gait. Patient would benefit from continued skilled PT to progress her mobility and strength in order to reduce pain, normalize gait, and maximize her functional ability.    PT Treatment/Interventions ADLs/Self Care Home Management;Aquatic Therapy;Cryotherapy;Electrical Stimulation;Iontophoresis 4mg /ml Dexamethasone;Moist Heat;Traction;Ultrasound;Neuromuscular re-education;Balance training;Therapeutic exercise;Therapeutic activities;Functional mobility training;Stair training;Gait training;Patient/family education;Manual techniques;Dry needling;Passive range of  motion;Taping;Vasopneumatic Device;Spinal  Manipulations;Joint Manipulations    PT Next Visit Plan Review HEP and progress PRN, manual for left lumbar paraspinals, progress core and hip strengthening    PT Home Exercise Plan [email or web address]    Consulted and Agree with Plan of Care Patient             Patient will benefit from skilled therapeutic intervention in order to improve the following deficits and impairments:  Abnormal gait, Decreased range of motion, Pain, Decreased activity tolerance, Decreased strength  Visit Diagnosis: Chronic left-sided low back pain, unspecified whether sciatica present  Muscle weakness (generalized)  Other abnormalities of gait and mobility     Problem List Patient Active Problem List   Diagnosis Date Noted   Carpal tunnel syndrome on right 04/07/2020   DM2 (diabetes mellitus, type 2) (HCC) 10/30/2019   Influenza vaccine needed 10/30/2019    Rosana Hoes, PT, DPT, LAT, ATC 10/22/20  12:07 PM Phone: [phone] Fax: [phone]   [facility] Outpatient Rehabilitation [facility] [address] [place], [place], [address] Phone: [phone]   Fax:  [phone]  Name: Adriauna Campton MRN: [id number] Date of Birth: 1970-02-09

## 2020-11-04 ENCOUNTER — Encounter (INDEPENDENT_AMBULATORY_CARE_PROVIDER_SITE_OTHER): Payer: Self-pay | Admitting: Ophthalmology

## 2020-11-04 ENCOUNTER — Ambulatory Visit (INDEPENDENT_AMBULATORY_CARE_PROVIDER_SITE_OTHER): Payer: 59 | Admitting: Ophthalmology

## 2020-11-04 ENCOUNTER — Other Ambulatory Visit: Payer: Self-pay

## 2020-11-04 DIAGNOSIS — H2513 Age-related nuclear cataract, bilateral: Secondary | ICD-10-CM | POA: Diagnosis not present

## 2020-11-04 DIAGNOSIS — E113493 Type 2 diabetes mellitus with severe nonproliferative diabetic retinopathy without macular edema, bilateral: Secondary | ICD-10-CM | POA: Insufficient documentation

## 2020-11-04 DIAGNOSIS — E113393 Type 2 diabetes mellitus with moderate nonproliferative diabetic retinopathy without macular edema, bilateral: Secondary | ICD-10-CM

## 2020-11-04 DIAGNOSIS — H43823 Vitreomacular adhesion, bilateral: Secondary | ICD-10-CM

## 2020-11-04 NOTE — Assessment & Plan Note (Signed)
Mild OU 

## 2020-11-04 NOTE — Progress Notes (Signed)
11/04/2020     CHIEF COMPLAINT Patient presents for  Chief Complaint  Patient presents with   Retina Evaluation      HISTORY OF PRESENT ILLNESS: Denise Hale is a 50 y.o. female who presents to the clinic today for:   HPI     Retina Evaluation   In both eyes.        Comments   NP evaluation for Diabetic Eye Exam. Diagnosed with Diabetes for 14 years. A1c: 6.9  Pt c/o fluctuating vision, from day to day.      Last edited by Reather Littler, COA on 11/04/2020 10:45 AM.      Referring physician: Gildardo Pounds, NP [place],  [place] [address]  HISTORICAL INFORMATION:   Selected notes from the MEDICAL RECORD NUMBER    Lab Results  Component Value Date   HGBA1C 6.6 09/01/2020     CURRENT MEDICATIONS: No current outpatient medications on file. (Ophthalmic Drugs)   No current facility-administered medications for this visit. (Ophthalmic Drugs)   Current Outpatient Medications (Other)  Medication Sig   atorvastatin (LIPITOR) 20 MG tablet Take 1 tablet (20 mg total) by mouth daily.   Blood Glucose Monitoring Suppl (ONETOUCH VERIO REFLECT) w/Device KIT Check blood glucose level by fingerstick twice per day. Dx: E11.65   Dulaglutide 1.5 MG/0.5ML SOPN INJECT 1.5 MG INTO THE SKIN ONCE A WEEK.   DULoxetine (CYMBALTA) 60 MG capsule TAKE 1 CAPSULE (60 MG TOTAL) BY MOUTH DAILY. FOR BACK PAIN   fluconazole (DIFLUCAN) 150 MG tablet Take 1 tablet (150 mg total) by mouth daily.   gabapentin (NEURONTIN) 600 MG tablet Take 1 tablet (600 mg total) by mouth 3 (three) times daily.   glucose blood (ONETOUCH VERIO) test strip Check blood glucose level by fingerstick twice per day. Dx: E11.65   HYDROcodone-acetaminophen (NORCO) 5-325 MG tablet Take 1 tablet by mouth daily as needed.   ibuprofen (ADVIL) 600 MG tablet TAKE 1 TABLET (600 MG TOTAL) BY MOUTH EVERY 8 (EIGHT) HOURS AS NEEDED. DO NOT TAKE WITH PAXIL   insulin aspart (NOVOLOG) 100 UNIT/ML FlexPen FOR BLOOD  SUGARS 0-150 GIVE 0 UNITS OF INSULIN, 151-200 GIVE 2 UNITS OF INSULIN, 201-250 GIVE 4 UNITS, 251-300 GIVE 6 UNITS, 301-350 GIVE 8 UNITS,   Insulin Pen Needle (B-D UF III MINI PEN NEEDLES) 31G X 5 MM MISC Use as instructed. Inject into the skin four times daily. E11.65   Lancets (ONETOUCH DELICA PLUS [id number]) MISC Check blood glucose level by fingerstick twice per day. Dx: E11.65   linaclotide (LINZESS) 72 MCG capsule Take 1 capsule (72 mcg total) by mouth daily before breakfast.   lisinopril (ZESTRIL) 5 MG tablet Take 1 tablet (5 mg total) by mouth daily.   mupirocin ointment (BACTROBAN) 2 % Apply 1 application topically 2 (two) times daily.   nystatin-triamcinolone ointment (MYCOLOG) Apply thin film to external vaginal area daily x 5 days.  Do not insert in vagina. (Patient taking differently: Apply thin film to external vaginal area daily x 5 days.  Do not insert in vagina.)   ondansetron (ZOFRAN ODT) 4 MG disintegrating tablet Take 1 tablet (4 mg total) by mouth every 8 (eight) hours as needed for nausea or vomiting.   tiZANidine (ZANAFLEX) 4 MG tablet TAKE 1 TABLET BY MOUTH EVERY 6 HOURS AS NEEDED FOR MUSCLE SPASMS.   traMADol (ULTRAM) 50 MG tablet Take 1 tablet (50 mg total) by mouth 3 (three) times daily as needed.   TRUEplus Lancets  28G MISC Use as instructed. Check blood glucose level by fingerstick twice per day.   E11.65 (Patient taking differently: Use as instructed. Check blood glucose level by fingerstick twice per day.   E11.65)   No current facility-administered medications for this visit. (Other)      REVIEW OF SYSTEMS:    ALLERGIES Allergies  Allergen Reactions   Ciprofloxacin    Metformin And Related Diarrhea    PAST MEDICAL HISTORY Past Medical History:  Diagnosis Date   Anxiety    Asthma    Diabetes mellitus without complication ([place])    Diverticulitis    GERD (gastroesophageal reflux disease)    Past Surgical History:  Procedure Laterality Date   CARPAL  TUNNEL RELEASE Right 04/08/2020   Procedure: RIGHT CARPAL TUNNEL RELEASE;  Surgeon: Leandrew Koyanagi, MD;  Location: [place] [facility];  Service: Orthopedics;  Laterality: Right;   CHOLECYSTECTOMY     COLON SURGERY      FAMILY HISTORY Family History  Problem Relation Age of Onset   Diabetes Mother    Thyroid disease Mother    Heart disease Father    Breast cancer Paternal Aunt    Breast cancer Paternal Aunt     SOCIAL HISTORY Social History   Tobacco Use   Smoking status: Former   Smokeless tobacco: Never  Scientific laboratory technician Use: Never used  Substance Use Topics   Alcohol use: Not Currently    Comment: occasionally   Drug use: Yes    Types: Marijuana         OPHTHALMIC EXAM:  Base Eye Exam     Visual Acuity (ETDRS)       Right Left   Dist [place] 20/20 -1 20/20 -2         Tonometry (Tonopen, 10:49 AM)       Right Left   Pressure 19 20         Pupils       Pupils Dark Light Shape React APD   Right PERRL 5 4 Round Brisk None   Left PERRL 5 4 Round Brisk None         Visual Fields (Counting fingers)       Left Right    Full Full         Extraocular Movement       Right Left    Full, Ortho Full, Ortho         Neuro/Psych     Oriented x3: Yes   Mood/Affect: Normal         Dilation     Both eyes: 1.0% Mydriacyl, 2.5% Phenylephrine @ 10:49 AM           Slit Lamp and Fundus Exam     External Exam       Right Left   External Normal Normal         Slit Lamp Exam       Right Left   Lids/Lashes Normal Normal   Conjunctiva/Sclera White and quiet White and quiet   Cornea Clear Clear   Anterior Chamber Deep and quiet Deep and quiet   Iris Round and reactive Round and reactive   Lens 1+ Nuclear sclerosis 1+ Nuclear sclerosis   Anterior Vitreous Normal Normal         Fundus Exam       Right Left   Posterior Vitreous  Normal   Disc Normal Normal   C/D Ratio 0.35 0.35   Macula no  macular thickening,  Microaneurysms no macular thickening, Microaneurysms   Vessels NPDR- Moderate NPDR- Moderate   Periphery Normal Normal            IMAGING AND PROCEDURES  Imaging and Procedures for 11/04/20  OCT, Retina - OU - Both Eyes       Right Eye Quality was good. Scan locations included subfoveal. Central Foveal Thickness: 259. Progression has no prior data. Findings include normal foveal contour, vitreomacular adhesion .   Left Eye Quality was good. Scan locations included subfoveal. Central Foveal Thickness: 259. Progression has no prior data. Findings include normal foveal contour, vitreomacular adhesion .              ASSESSMENT/PLAN:  Moderate nonproliferative diabetic retinopathy of both eyes without macular edema associated with type 2 diabetes mellitus (HCC) Moderate nonproliferative diabetic retinopathy OU   The nature of diabetic retinopathy was explained using the following analogy: "Retinopathy develops in the body's blood supply like salty water corrodes the linings of pipes in a house, until rust appears, then holes in the pipes develop which leak followed by destruction and loss of the pipes as the corrosion turns them to dust. In a similar fashion, Diabetes damages the blood supply of the body by cumulative long--term elevated blood sugar, which corrodes the blood supply in the body, particularly the blood vessels supplying the retina, kidneys, and nerves".  Thus, control of blood sugar, slows the progression of the corrosive effect of diabetes mellitus.   The nature of moderate nonproliferative diabetic retinopathy was discussed with the patient as well as the need for more frequent follow up to judge for progression. Good blood glucose, blood pressure, and serum lipid control was recommended as well as avoidance of smoking and maintenance of normal weight.  Close follow up with PCP encouraged.  Nuclear sclerotic cataract of both eyes Mild OU     ICD-10-CM   1.  Moderate nonproliferative diabetic retinopathy of both eyes without macular edema associated with type 2 diabetes mellitus (HCC)  Y64.1583 OCT, Retina - OU - Both Eyes    2. 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 Nuclear sclerotic cataract of both eyes  H25.13       1.  OU with moderate nonproliferative diabetic retinopathy.  No active maculopathy we will continue to monitor and observe  2.  3.  Ophthalmic Meds Ordered this visit:  No orders of the defined types were placed in this encounter.      Return in about 6 months (around 05/04/2021) for DILATE OU, COLOR FP, OCT.  There are no Patient Instructions on file for this visit.   Explained the diagnoses, plan, and follow up with the patient and they expressed understanding.  Patient expressed understanding of the importance of proper follow up care.   Clent Demark Future Yeldell M.D. Diseases & Surgery of the Retina and Vitreous Retina & Diabetic [place] 11/04/20     Abbreviations: M myopia (nearsighted); A astigmatism; H hyperopia (farsighted); P presbyopia; Mrx spectacle prescription;  CTL contact lenses; OD right eye; OS left eye; OU both eyes  XT exotropia; ET esotropia; PEK punctate epithelial keratitis; PEE punctate epithelial erosions; DES dry eye syndrome; MGD meibomian gland dysfunction; ATs artificial tears; PFAT's preservative free artificial tears; [place] nuclear sclerotic cataract; PSC posterior subcapsular cataract; ERM epi-retinal membrane; PVD posterior vitreous detachment; RD retinal detachment; DM diabetes mellitus; DR diabetic retinopathy; NPDR non-proliferative diabetic retinopathy; PDR proliferative diabetic retinopathy; CSME clinically significant macular edema;  DME diabetic macular edema; dbh dot blot hemorrhages; CWS cotton wool spot; POAG primary open angle glaucoma; C/D cup-to-disc ratio; HVF humphrey visual field; GVF goldmann visual field; OCT optical coherence tomography; IOP intraocular pressure; BRVO  Branch retinal vein occlusion; CRVO central retinal vein occlusion; CRAO central retinal artery occlusion; BRAO branch retinal artery occlusion; RT retinal tear; SB scleral buckle; PPV pars plana vitrectomy; VH Vitreous hemorrhage; PRP panretinal laser photocoagulation; IVK intravitreal kenalog; VMT vitreomacular traction; MH Macular hole;  NVD neovascularization of the disc; NVE neovascularization elsewhere; AREDS age related eye disease study; ARMD age related macular degeneration; POAG primary open angle glaucoma; EBMD epithelial/anterior basement membrane dystrophy; ACIOL anterior chamber intraocular lens; IOL intraocular lens; PCIOL posterior chamber intraocular lens; Phaco/IOL phacoemulsification with intraocular lens placement; [place] photorefractive keratectomy; LASIK laser assisted in situ keratomileusis; HTN hypertension; DM diabetes mellitus; COPD chronic obstructive pulmonary disease

## 2020-11-04 NOTE — Assessment & Plan Note (Addendum)
Moderate nonproliferative diabetic retinopathy OU   The nature of diabetic retinopathy was explained using the following analogy: "Retinopathy develops in the body's blood supply like salty water corrodes the linings of pipes in a house, until rust appears, then holes in the pipes develop which leak followed by destruction and loss of the pipes as the corrosion turns them to dust. In a similar fashion, Diabetes damages the blood supply of the body by cumulative long--term elevated blood sugar, which corrodes the blood supply in the body, particularly the blood vessels supplying the retina, kidneys, and nerves".  Thus, control of blood sugar, slows the progression of the corrosive effect of diabetes mellitus.   The nature of moderate nonproliferative diabetic retinopathy was discussed with the patient as well as the need for more frequent follow up to judge for progression. Good blood glucose, blood pressure, and serum lipid control was recommended as well as avoidance of smoking and maintenance of normal weight.  Close follow up with PCP encouraged.

## 2020-11-10 ENCOUNTER — Other Ambulatory Visit: Payer: Self-pay

## 2020-11-10 ENCOUNTER — Ambulatory Visit: Payer: 59 | Attending: Family Medicine | Admitting: Physical Therapy

## 2020-11-10 ENCOUNTER — Encounter: Payer: Self-pay | Admitting: Physical Therapy

## 2020-11-10 DIAGNOSIS — G8929 Other chronic pain: Secondary | ICD-10-CM | POA: Insufficient documentation

## 2020-11-10 DIAGNOSIS — M6281 Muscle weakness (generalized): Secondary | ICD-10-CM | POA: Diagnosis present

## 2020-11-10 DIAGNOSIS — M545 Low back pain, unspecified: Secondary | ICD-10-CM | POA: Diagnosis present

## 2020-11-10 DIAGNOSIS — R2689 Other abnormalities of gait and mobility: Secondary | ICD-10-CM | POA: Insufficient documentation

## 2020-11-10 NOTE — Therapy (Signed)
[facility] [address] [place], [place], [address] Phone: [phone]   Fax:  [phone]  Physical Therapy Treatment  Patient Details  Name: Denise Hale MRN: [id number] Date of Birth: [birth year] Referring Provider (PT): Hoy Register, MD   Encounter Date: 11/10/2020   PT End of Session - 11/10/20 1141     Visit Number 6    Number of Visits 12    Date for PT Re-Evaluation 12/22/20    Authorization Type [employer]    Authorization Time Period FOTO by 6th    PT Start Time 1134    PT Stop Time 1220   8 min of TPDN performed   PT Time Calculation (min) 46 min    Activity Tolerance Patient tolerated treatment well    Behavior During Therapy [facility] for tasks assessed/performed             Past Medical History:  Diagnosis Date   Anxiety    Asthma    Diabetes mellitus without complication (HCC)    Diverticulitis    GERD (gastroesophageal reflux disease)     Past Surgical History:  Procedure Laterality Date   CARPAL TUNNEL RELEASE Right 04/08/2020   Procedure: RIGHT CARPAL TUNNEL RELEASE;  Surgeon: Tarry Kos, MD;  Location: [facility];  Service: Orthopedics;  Laterality: Right;   CHOLECYSTECTOMY     COLON SURGERY      There were no vitals filed for this visit.   Subjective Assessment - 11/10/20 1138     Subjective Patient reports she has been doing well, has been working a lot recently. States she is a little sore from standing and she hasn't been able to do much of her exercises.. She has been noticing some upper neck/back pain.    Patient Stated Goals Get rid of pain    Currently in Pain? Yes    Pain Score 6     Pain Location Back    Pain Orientation Left;Lower    Pain Descriptors / Indicators Aching;Tightness    Pain Type Chronic pain    Pain Onset More than a month ago    Pain Frequency Intermittent    Aggravating Factors  General movement, sitting, sleeping, standing extended periods     Pain Relieving Factors Medication, massage, hot tub                [facility] PT Assessment - 11/10/20 0001       Assessment   Medical Diagnosis Chronic bilateral low back pain with left-sided sciatica    Referring Provider (PT) Hoy Register, MD      Precautions   Precautions None      Restrictions   Weight Bearing Restrictions No      Balance Screen   Has the patient fallen in the past 6 months Yes    How many times? 1- tripped over shoelace    Has the patient had a decrease in activity level because of a fear of falling?  No    Is the patient reluctant to leave their home because of a fear of falling?  No      Prior Function   Level of Independence Independent      Cognition   Overall Cognitive Status Within Functional Limits for tasks assessed      Observation/Other Assessments   Observations Patient appears in no apparent distress    Focus on Therapeutic Outcomes (FOTO)  63% functional status      AROM  Overall AROM Comments Lumbar motion gross WFL but continues to report left sided low back tightness      Strength   Right Hip Extension 3/5    Right Hip ABduction 4-/5    Left Hip Extension 3-/5    Left Hip ABduction 3+/5      Palpation   Palpation comment Tenderness and increased muscle tension left lumbar paraspinals, gluteal and piriformis region                    [facility] Adult PT Treatment/Exercise:  Therapeutic Exercise: NuStep L5 x 5 min with LE while taking subjective LTR 5 x 10 Piriformis stretch 2 x 20 sec bilat Child's pose 2 x 20 sec Bridge x 10 with 5 sec hold Sidelying hip abduction x 10 each Deadlift 15# from 6" box 2 x 10  Manual Therapy: Skilled palpation and monitoring while performing TPDN STM and myofascial release of left lumbar paraspinals, piriformis and gluteal region  Neuromuscular re-ed: N/A  Therapeutic Activity: N/A  Modalities: N/A  Self Care: N/A  Trigger Point Dry Needling  Treatment: Pre-treatment instruction: Patient instructed on dry needling rationale, procedures, and possible side effects including pain during treatment (achy,cramping feeling), bruising, drop of blood, lightheadedness, nausea, sweating. Patient Consent Given: Yes Education handout provided: No Muscles treated: left lumbar multifidus, iliacus, glute med/min, piriformis  Needle size and number: .30x47mm x 4 Electrical stimulation performed: No Parameters: N/A Treatment response/outcome: Twitch response elicited, Palpable decrease in muscle tension, and patient reports reduce muscular tightness Post-treatment instructions: Patient instructed to expect possible mild to moderate muscle soreness later today and/or tomorrow. Patient instructed in methods to reduce muscle soreness and to continue prescribed HEP. If patient was dry needled over the lung field, patient was instructed on signs and symptoms of pneumothorax and, however unlikely, to see immediate medical attention should they occur. Patient was also educated on signs and symptoms of infection and to seek medical attention should they occur. Patient verbalized understanding of these instructions and education.                 PT Education - 11/10/20 1140     Education Details HEP, dry needling    Person(s) Educated Patient    Methods Explanation;Demonstration;Verbal cues    Comprehension Verbalized understanding;Returned demonstration;Verbal cues required;Need further instruction              PT Short Term Goals - 11/10/20 1319       PT SHORT TERM GOAL #1   Title Patient will be I with initial HEP to progress with PT    Baseline independent with initial HEP    Time 3    Period Weeks    Status Achieved      PT SHORT TERM GOAL #2   Title PT will review FOTO with patient by 3rd visit to understand expected progress    Baseline reviewed 2nd visit    Time 3    Period Weeks    Status Achieved    Target Date  10/08/20      PT SHORT TERM GOAL #3   Title Patient will demonstrate lumbar AROM grossly WFL and non painful to improve lifting ability at work    Baseline lumbar motion grossly WFL but continues with left side tightness    Time 3    Period Weeks    Status On-going    Target Date 12/01/20      PT SHORT TERM GOAL #4   Title Patient  will demonstrate ambulation without antalgic gait to improve community mobility    Baseline gait grossly WFL    Time 4    Period Weeks    Status Achieved               PT Long Term Goals - 11/10/20 1320       PT LONG TERM GOAL #1   Title Patient will be I with final HEP to maintain progress from PT    Baseline progressing with HEP    Time 6    Period Weeks    Status On-going    Target Date 12/22/20      PT LONG TERM GOAL #2   Title Patient will be able to sit >/= 30 minutes without increase in pain to reduce functional limitation    Baseline patient continues to report limitation with sitting    Time 6    Period Weeks    Status On-going    Target Date 12/22/20      PT LONG TERM GOAL #3   Title Patient will exhibit core and hip strength grossly >/= 4/5 MMT in order to reduce pain and improve tolerance for standing at work    Baseline patient continues to demonstrate gross core and hip strength deficit    Time 6    Period Weeks    Status On-going    Target Date 12/22/20      PT LONG TERM GOAL #4   Title Patient will report improved functional status >/= 64% on FOTO to indicate improve functonal ability    Baseline 63%    Time 6    Period Weeks    Status On-going    Target Date 12/22/20                   Plan - 11/10/20 1203     Clinical Impression Statement Patient tolerated therapy well with no adverse effects. Patient has had gap in therapy due to scheduling difficulties and has been working more recently resulting in slightly increased tightness this visit. Overall she demonstrates improvement in her pain, motion,  strength, and overall function. She does continue to report tightness of left lower back with extended periods of activity but this is improving and her stretches help to relieve this. Trialed dry needling this visit to improve muscular tightness and reduce discomfort with movement. She did report improvement in symptoms following therapy and was able to continue exercises consisting of lifting. No changes to HEP this visit. Will re-cert POC for 1x/week for 6 weeks as patient would benefit from continued skilled PT to progress her mobility and strength in order to reduce pain, normalize gait, and maximize her functional ability.    PT Frequency 1x / week    PT Duration 6 weeks    PT Treatment/Interventions ADLs/Self Care Home Management;Aquatic Therapy;Cryotherapy;Electrical Stimulation;Iontophoresis 4mg /ml Dexamethasone;Moist Heat;Traction;Ultrasound;Neuromuscular re-education;Balance training;Therapeutic exercise;Therapeutic activities;Functional mobility training;Stair training;Gait training;Patient/family education;Manual techniques;Dry needling;Passive range of motion;Taping;Vasopneumatic Device;Spinal Manipulations;Joint Manipulations    PT Next Visit Plan Review HEP and progress PRN, manual/dry needling for left lumbar paraspinals, progress core and hip strengthening, lifting    PT Home Exercise Plan    Consulted and Agree with Plan of Care Patient             Patient will benefit from skilled therapeutic intervention in order to improve the following deficits and impairments:  Abnormal gait, Decreased range of motion, Pain, Decreased activity tolerance, Decreased strength  Visit Diagnosis: Chronic left-sided low  back pain, unspecified whether sciatica present  Muscle weakness (generalized)  Other abnormalities of gait and mobility     Problem List Patient Active Problem List   Diagnosis Date Noted   Vitreomacular adhesion of both eyes 11/04/2020   Moderate  nonproliferative diabetic retinopathy of both eyes without macular edema associated with type 2 diabetes mellitus (HCC) 11/04/2020   Nuclear sclerotic cataract of both eyes 11/04/2020   Carpal tunnel syndrome on right 04/07/2020   DM2 (diabetes mellitus, type 2) (HCC) 10/30/2019   Influenza vaccine needed 10/30/2019    Rosana Hoes, PT, DPT, LAT, ATC 11/10/20  1:28 PM Phone: [phone] Fax: [phone]   [facility] [address] [place], [place], [address] Phone: [phone]   Fax:  [phone]  Name: Tedi Hughson MRN: [id number] Date of Birth: 02/06/1970

## 2020-11-18 ENCOUNTER — Encounter: Payer: Self-pay | Admitting: Nurse Practitioner

## 2020-11-18 ENCOUNTER — Other Ambulatory Visit: Payer: Self-pay | Admitting: Nurse Practitioner

## 2020-11-18 ENCOUNTER — Other Ambulatory Visit: Payer: Self-pay

## 2020-11-18 ENCOUNTER — Ambulatory Visit: Payer: 59 | Attending: Nurse Practitioner | Admitting: Nurse Practitioner

## 2020-11-18 VITALS — BP 130/83 | HR 82 | Ht 66.0 in | Wt 145.4 lb

## 2020-11-18 DIAGNOSIS — Z23 Encounter for immunization: Secondary | ICD-10-CM

## 2020-11-18 DIAGNOSIS — M792 Neuralgia and neuritis, unspecified: Secondary | ICD-10-CM

## 2020-11-18 DIAGNOSIS — E1142 Type 2 diabetes mellitus with diabetic polyneuropathy: Secondary | ICD-10-CM

## 2020-11-18 DIAGNOSIS — Z1211 Encounter for screening for malignant neoplasm of colon: Secondary | ICD-10-CM

## 2020-11-18 DIAGNOSIS — F419 Anxiety disorder, unspecified: Secondary | ICD-10-CM | POA: Diagnosis not present

## 2020-11-18 DIAGNOSIS — F32A Depression, unspecified: Secondary | ICD-10-CM

## 2020-11-18 MED ORDER — PREGABALIN 50 MG PO CAPS: 50.0000 mg | ORAL_CAPSULE | Freq: Two times a day (BID) | ORAL | 0 refills | Status: DC

## 2020-11-18 MED ORDER — CITALOPRAM HYDROBROMIDE 10 MG PO TABS: 10.0000 mg | ORAL_TABLET | Freq: Every day | ORAL | 3 refills | Status: DC

## 2020-11-18 NOTE — Progress Notes (Signed)
Assessment & Plan:  Denise Hale was seen today for foot swelling and hand pain.  Diagnoses and all orders for this visit:  Peripheral neuropathic pain -     pregabalin (LYRICA) 50 MG capsule; Take 1 capsule (50 mg total) by mouth 2 (two) times daily. -     Ambulatory referral to Podiatry  Anxiety and depression -     citalopram (CELEXA) 10 MG tablet; Take 1 tablet (10 mg total) by mouth daily.  Colon cancer screening -     Ambulatory referral to Gastroenterology  Need for influenza vaccination -     Flu Vaccine QUAD 76moIM (Fluarix, Fluzone & Alfiuria Quad PF)  Need for shingles vaccine -     Varicella-zoster vaccine IM   Patient has been counseled on age-appropriate routine health concerns for screening and prevention. These are reviewed and up-to-date. Referrals have been placed accordingly. Immunizations are up-to-date or declined.    Subjective:   Chief Complaint  Patient presents with  . Foot Swelling  . Hand Pain   HPI Denise Hale 50y.o. female presents to office today for peripheral neuropathy and hand swelling with pain  has a past medical history of Anxiety, Asthma, Diabetes mellitus without complication ([place], Diverticulitis, and GERD (gastroesophageal reflux disease).   She endorses persistent right hand swelling and pain.  This has been ongoing since her carpal tunnel surgery several months ago.  Recommend that she follow-up with the orthopedic surgeon regarding this.  She is status post right carpal tunnel release 04/08/2020.  Neuropathy: She describes symptoms of numbness, burning, tingling, and sharp pain in both feet with left greater than right. Onset of symptoms was  several months ago . Symptoms are currently of moderate severity. Symptoms occur all day and last hours. Previous treatment has included gabapentin 600 mg 3 times daily, which has not improved symptoms.  Application of heat does seem to help "calm" the intensity of pain  Anxiety and depression She  is very tearful today States she is dealing with a lot of grief and also family conflict right now.  She has been on antidepressants in the past and also was seeing a therapist.would like to restart SSRI today.  [facility] 11/18/2020 09/01/2020 08/17/2020 02/27/2020 08/06/2019  Decreased Interest 1 1 0 0 2  Down, Depressed, Hopeless 1 0 '1 1 2  ' PHQ - 2 Score '2 1 1 1 4  ' Altered sleeping '3 1 1 1 1  ' Tired, decreased energy '1 1 1 1 1  ' Change in appetite '1 2 1 2 1  ' Feeling bad or failure about yourself  0 0 1 0 2  Trouble concentrating '2 1 2 1 2  ' Moving slowly or fidgety/restless '1 1 1 1 ' 0  Suicidal thoughts 0 0 0 0 0  PHQ-9 Score '10 7 8 7 11  ' Difficult doing work/chores Not difficult at all Not difficult at all - - -    GAD 7 : Generalized Anxiety Score 11/18/2020 09/01/2020 08/17/2020 02/27/2020  Nervous, Anxious, on Edge 0 0 0 0  Control/stop worrying 1 0 0 1  Worry too much - different things 1 0 1 1  Trouble relaxing '1 1 2 1  ' Restless 0 '1 1 1  ' Easily annoyed or irritable '1 1 1 ' 0  Afraid - awful might happen 0 0 0 0  Total GAD 7 Score '4 3 5 4  ' Anxiety Difficulty Not difficult at all Not difficult at all - -  Review of Systems  Constitutional:  Negative for fever, malaise/fatigue and weight loss.  HENT: Negative.  Negative for nosebleeds.   Eyes: Negative.  Negative for blurred vision, double vision and photophobia.  Respiratory: Negative.  Negative for cough and shortness of breath.   Cardiovascular: Negative.  Negative for chest pain, palpitations and leg swelling.  Gastrointestinal: Negative.  Negative for heartburn, nausea and vomiting.  Musculoskeletal:  Positive for joint pain. Negative for myalgias.  Neurological:  Positive for tingling and sensory change. Negative for dizziness, focal weakness, seizures and headaches.  Psychiatric/Behavioral:  Positive for depression. Negative for suicidal ideas. The patient is nervous/anxious.    Past Medical History:   Diagnosis Date  . Anxiety   . Asthma   . Diabetes mellitus without complication ([place])   . Diverticulitis   . GERD (gastroesophageal reflux disease)     Past Surgical History:  Procedure Laterality Date  . CARPAL TUNNEL RELEASE Right 04/08/2020   Procedure: RIGHT CARPAL TUNNEL RELEASE;  Surgeon: Leandrew Koyanagi, MD;  Location: [facility];  Service: Orthopedics;  Laterality: Right;  . CHOLECYSTECTOMY    . COLON SURGERY      Family History  Problem Relation Age of Onset  . Diabetes Mother   . Thyroid disease Mother   . Heart disease Father   . Breast cancer Paternal Aunt   . Breast cancer Paternal Aunt     Social History Reviewed with no changes to be made today.   Outpatient Medications Prior to Visit  Medication Sig Dispense Refill  . atorvastatin (LIPITOR) 20 MG tablet Take 1 tablet (20 mg total) by mouth daily. 90 tablet 3  . Blood Glucose Monitoring Suppl (ONETOUCH VERIO REFLECT) w/Device KIT Check blood glucose level by fingerstick twice per day. Dx: E11.65 1 kit 0  . Dulaglutide 1.5 MG/0.5ML SOPN INJECT 1.5 MG INTO THE SKIN ONCE A WEEK. 2 mL 2  . DULoxetine (CYMBALTA) 60 MG capsule TAKE 1 CAPSULE (60 MG TOTAL) BY MOUTH DAILY. FOR BACK PAIN 90 capsule 1  . fluconazole (DIFLUCAN) 150 MG tablet Take 1 tablet (150 mg total) by mouth daily. 3 tablet 0  . glucose blood (ONETOUCH VERIO) test strip Check blood glucose level by fingerstick twice per day. Dx: E11.65 100 each 2  . Insulin Pen Needle (B-D UF III MINI PEN NEEDLES) 31G X 5 MM MISC Use as instructed. Inject into the skin four times daily. E11.65 200 each 6  . Lancets (ONETOUCH DELICA PLUS [id number]) MISC Check blood glucose level by fingerstick twice per day. Dx: E11.65 100 each 2  . linaclotide (LINZESS) 72 MCG capsule Take 1 capsule (72 mcg total) by mouth daily before breakfast. 90 capsule 2  . lisinopril (ZESTRIL) 5 MG tablet Take 1 tablet (5 mg total) by mouth daily. 90 tablet 3  . mupirocin ointment  (BACTROBAN) 2 % Apply 1 application topically 2 (two) times daily. 60 g 0  . nystatin-triamcinolone ointment (MYCOLOG) Apply thin film to external vaginal area daily x 5 days.  Do not insert in vagina. (Patient taking differently: Apply thin film to external vaginal area daily x 5 days.  Do not insert in vagina.) 30 g 0  . ondansetron (ZOFRAN ODT) 4 MG disintegrating tablet Take 1 tablet (4 mg total) by mouth every 8 (eight) hours as needed for nausea or vomiting. 40 tablet 1  . tiZANidine (ZANAFLEX) 4 MG tablet TAKE 1 TABLET BY MOUTH EVERY 6 HOURS AS NEEDED FOR MUSCLE SPASMS. 60 tablet 1  .  traMADol (ULTRAM) 50 MG tablet Take 1 tablet (50 mg total) by mouth 3 (three) times daily as needed. 30 tablet 0  . TRUEplus Lancets 28G MISC Use as instructed. Check blood glucose level by fingerstick twice per day.   E11.65 (Patient taking differently: Use as instructed. Check blood glucose level by fingerstick twice per day.   E11.65) 100 each 3  . insulin aspart (NOVOLOG) 100 UNIT/ML FlexPen FOR BLOOD SUGARS 0-150 GIVE 0 UNITS OF INSULIN, 151-200 GIVE 2 UNITS OF INSULIN, 201-250 GIVE 4 UNITS, 251-300 GIVE 6 UNITS, 301-350 GIVE 8 UNITS, 15 mL 11  . gabapentin (NEURONTIN) 600 MG tablet Take 1 tablet (600 mg total) by mouth 3 (three) times daily. 90 tablet 3  . HYDROcodone-acetaminophen (NORCO) 5-325 MG tablet Take 1 tablet by mouth daily as needed. (Patient not taking: Reported on 11/18/2020) 10 tablet 0   No facility-administered medications prior to visit.    Allergies  Allergen Reactions  . Ciprofloxacin   . Metformin And Related Diarrhea       Objective:    BP 130/83   Pulse 82   Ht '5\' 6"'  (1.676 m)   Wt 145 lb 6 oz (65.9 kg)   SpO2 94%   BMI 23.46 kg/m  Wt Readings from Last 3 Encounters:  11/18/20 145 lb 6 oz (65.9 kg)  09/01/20 141 lb 4 oz (64.1 kg)  08/17/20 147 lb 12.8 oz (67 kg)    Physical Exam Vitals and nursing note reviewed.  Constitutional:      Appearance: She is  well-developed.  HENT:     Head: Normocephalic and atraumatic.  Cardiovascular:     Rate and Rhythm: Normal rate and regular rhythm.     Pulses:          Dorsalis pedis pulses are 1+ on the right side and 1+ on the left side.     Heart sounds: Normal heart sounds. No murmur heard.   No friction rub. No gallop.  Pulmonary:     Effort: Pulmonary effort is normal. No tachypnea or respiratory distress.     Breath sounds: Normal breath sounds. No decreased breath sounds, wheezing, rhonchi or rales.  Chest:     Chest wall: No tenderness.  Abdominal:     General: Bowel sounds are normal.     Palpations: Abdomen is soft.  Musculoskeletal:        General: Normal range of motion.     Cervical back: Normal range of motion.  Feet:     Right foot:     Skin integrity: No skin breakdown.     Left foot:     Skin integrity: No skin breakdown.     Comments: Toenails are all painted so unable to identify any onychomycosis today Skin:    General: Skin is warm and dry.  Neurological:     Mental Status: She is alert and oriented to person, place, and time.     Coordination: Coordination normal.  Psychiatric:        Mood and Affect: Mood is depressed. Affect is tearful.        Speech: Speech normal.        Behavior: Behavior normal. Behavior is cooperative.        Thought Content: Thought content normal. Thought content does not include homicidal or suicidal ideation. Thought content does not include homicidal or suicidal plan.        Cognition and Memory: Cognition normal.        Judgment: Judgment  normal.         Patient has been counseled extensively about nutrition and exercise as well as the importance of adherence with medications and regular follow-up. The patient was given clear instructions to go to ER or return to medical center if symptoms don't improve, worsen or new problems develop. The patient verbalized understanding.   Follow-up: Return for 3 Weeks for Depression TELE on  Tuesday.   Gildardo Pounds, FNP-BC [facility] and [facility], [place]   11/18/2020, 11:50 AM

## 2020-11-18 NOTE — Telephone Encounter (Signed)
Requested Prescriptions  Pending Prescriptions Disp Refills  . TRULICITY 1.5 MG/0.5ML SOPN [Pharmacy Med Name: TRULICITY 1.5 MG/0.5 ML PEN] 1 mL 1    Sig: INJECT 1.5 MG INTO THE SKIN ONCE A WEEK.     Endocrinology:  Diabetes - GLP-1 Receptor Agonists Passed - 11/18/2020  2:56 PM      Passed - HBA1C is between 0 and 7.9 and within 180 days    HbA1c, POC (controlled diabetic range)  Date Value Ref Range Status  09/01/2020 6.6 0.0 - 7.0 % Final         Passed - Valid encounter within last 6 months    Recent Outpatient Visits          Today Peripheral neuropathic pain   [facility] [place], [place] W, NP   2 months ago Type 2 diabetes mellitus with diabetic polyneuropathy, with long-term current use of insulin [facility])   [facility] [place], [place] W, NP   3 months ago Chronic bilateral low back pain with left-sided sciatica   [facility] [place], Odette Horns, MD   4 months ago Type 2 diabetes mellitus with diabetic polyneuropathy, with long-term current use of insulin [facility])   [facility] [place], Shea Stakes, NP   5 months ago Chronic vaginitis   [facility] Marc[place] Matar, MD      Future Appointments            In 3 weeks Claiborne Rigg, NP [facility]   In 2 months Claiborne Rigg, NP [facility]

## 2020-11-23 ENCOUNTER — Ambulatory Visit (INDEPENDENT_AMBULATORY_CARE_PROVIDER_SITE_OTHER): Payer: 59 | Admitting: Podiatry

## 2020-11-23 ENCOUNTER — Other Ambulatory Visit: Payer: Self-pay

## 2020-11-23 ENCOUNTER — Ambulatory Visit: Payer: 59

## 2020-11-23 DIAGNOSIS — E1142 Type 2 diabetes mellitus with diabetic polyneuropathy: Secondary | ICD-10-CM

## 2020-11-23 DIAGNOSIS — L603 Nail dystrophy: Secondary | ICD-10-CM

## 2020-11-23 DIAGNOSIS — E119 Type 2 diabetes mellitus without complications: Secondary | ICD-10-CM

## 2020-11-23 DIAGNOSIS — M7750 Other enthesopathy of unspecified foot: Secondary | ICD-10-CM

## 2020-11-23 NOTE — Progress Notes (Signed)
ANNUAL DIABETIC FOOT EXAM  Subjective: Sameerah Nachtigal presents today for  burning numbness and tingling, thickened nails, for diabetic foot evaluation, and for annual diabetic foot examination.   Patient has no h/o foot wounds.  Patient does have symptoms of foot numbness.  Patient does have symptoms of foot tingling.  Patient does have symptoms of burning in feet.  Patient does have symptoms of pins/needles in feet.  Patient has been diagnosed with neuropathy and it is managed with Lyrica, she was on gabapentin for this was not helpful and that she has been transitioned now.   Gildardo Pounds, NP is patient's PCP. Last visit was 11/18/2020.  Past Medical History:  Diagnosis Date   Anxiety    Asthma    Diabetes mellitus without complication ([place])    Diverticulitis    GERD (gastroesophageal reflux disease)    Patient Active Problem List   Diagnosis Date Noted   Vitreomacular adhesion of both eyes 11/04/2020   Moderate nonproliferative diabetic retinopathy of both eyes without macular edema associated with type 2 diabetes mellitus ([place]) 11/04/2020   Nuclear sclerotic cataract of both eyes 11/04/2020   Carpal tunnel syndrome on right 04/07/2020   DM2 (diabetes mellitus, type 2) ([place]) 10/30/2019   Influenza vaccine needed 10/30/2019   Past Surgical History:  Procedure Laterality Date   CARPAL TUNNEL RELEASE Right 04/08/2020   Procedure: RIGHT CARPAL TUNNEL RELEASE;  Surgeon: Leandrew Koyanagi, MD;  Location: [facility];  Service: Orthopedics;  Laterality: Right;   CHOLECYSTECTOMY     COLON SURGERY     Current Outpatient Medications on File Prior to Visit  Medication Sig Dispense Refill   atorvastatin (LIPITOR) 20 MG tablet Take 1 tablet (20 mg total) by mouth daily. 90 tablet 3   Blood Glucose Monitoring Suppl (ONETOUCH VERIO REFLECT) w/Device KIT Check blood glucose level by fingerstick twice per day. Dx: E11.65 1 kit 0   citalopram (CELEXA) 10 MG tablet Take 1  tablet (10 mg total) by mouth daily. 30 tablet 3   DULoxetine (CYMBALTA) 30 MG capsule Cymbalta 30 mg capsule,delayed release  Take 1 capsule every day by oral route.     DULoxetine (CYMBALTA) 60 MG capsule TAKE 1 CAPSULE (60 MG TOTAL) BY MOUTH DAILY. FOR BACK PAIN 90 capsule 1   fluconazole (DIFLUCAN) 150 MG tablet Take 1 tablet (150 mg total) by mouth daily. 3 tablet 0   gabapentin (NEURONTIN) 300 MG capsule gabapentin 300 mg capsule  Take 1 capsule 3 times a day by oral route.     glucose blood (ONETOUCH VERIO) test strip Check blood glucose level by fingerstick twice per day. Dx: E11.65 100 each 2   insulin aspart (NOVOLOG) 100 UNIT/ML FlexPen FOR BLOOD SUGARS 0-150 GIVE 0 UNITS OF INSULIN, 151-200 GIVE 2 UNITS OF INSULIN, 201-250 GIVE 4 UNITS, 251-300 GIVE 6 UNITS, 301-350 GIVE 8 UNITS, 15 mL 11   insulin lispro (HUMALOG KWIKPEN) 100 UNIT/ML KwikPen Humalog KwikPen (U-100) Insulin 100 unit/mL subcutaneous  Inject by subcutaneous route.     Insulin Pen Needle (B-D UF III MINI PEN NEEDLES) 31G X 5 MM MISC Use as instructed. Inject into the skin four times daily. E11.65 200 each 6   Lancets (ONETOUCH DELICA PLUS [id number]) MISC Check blood glucose level by fingerstick twice per day. Dx: E11.65 100 each 2   linaclotide (LINZESS) 72 MCG capsule Take 1 capsule (72 mcg total) by mouth daily before breakfast. 90 capsule 2   linagliptin (TRADJENTA) 5 MG TABS tablet  Tradjenta 5 mg tablet  Take 1 tablet every day by oral route.     lisinopril (ZESTRIL) 5 MG tablet Take 1 tablet (5 mg total) by mouth daily. 90 tablet 3   mupirocin ointment (BACTROBAN) 2 % Apply 1 application topically 2 (two) times daily. 60 g 0   nystatin-triamcinolone ointment (MYCOLOG) Apply thin film to external vaginal area daily x 5 days.  Do not insert in vagina. (Patient taking differently: Apply thin film to external vaginal area daily x 5 days.  Do not insert in vagina.) 30 g 0   ondansetron (ZOFRAN ODT) 4 MG disintegrating  tablet Take 1 tablet (4 mg total) by mouth every 8 (eight) hours as needed for nausea or vomiting. 40 tablet 1   pantoprazole (PROTONIX) 40 MG tablet pantoprazole 40 mg tablet,delayed release  Take 1 tablet every day by oral route.     pregabalin (LYRICA) 50 MG capsule Take 1 capsule (50 mg total) by mouth 2 (two) times daily. 60 capsule 0   tiZANidine (ZANAFLEX) 4 MG tablet TAKE 1 TABLET BY MOUTH EVERY 6 HOURS AS NEEDED FOR MUSCLE SPASMS. 60 tablet 1   traMADol (ULTRAM) 50 MG tablet Take 1 tablet (50 mg total) by mouth 3 (three) times daily as needed. 30 tablet 0   TRUEplus Lancets 28G MISC Use as instructed. Check blood glucose level by fingerstick twice per day.   E11.65 (Patient taking differently: Use as instructed. Check blood glucose level by fingerstick twice per day.   E11.65) 546 each 3   TRULICITY 1.5 TK/3.5WS SOPN INJECT 1.5 MG INTO THE SKIN ONCE A WEEK. 1 mL 1   No current facility-administered medications on file prior to visit.    Allergies  Allergen Reactions   Ciprofloxacin    Metformin And Related Diarrhea   Social History   Occupational History   Not on file  Tobacco Use   Smoking status: Former   Smokeless tobacco: Never  Vaping Use   Vaping Use: Never used  Substance and Sexual Activity   Alcohol use: Not Currently    Comment: occasionally   Drug use: Yes    Types: Marijuana   Sexual activity: Yes    Birth control/protection: None   Family History  Problem Relation Age of Onset   Diabetes Mother    Thyroid disease Mother    Heart disease Father    Breast cancer Paternal Aunt    Breast cancer Paternal Aunt    Immunization History  Administered Date(s) Administered   Influenza,inj,Quad PF,6+ Mos 10/30/2019, 11/18/2020   Zoster Recombinat (Shingrix) 11/18/2020     Review of Systems: Negative except as noted in the HPI.   Objective: There were no vitals filed for this visit.  Niurka Maule is a pleasant 50 y.o. female in NAD. AAO X 3.  Vascular  Examination: CFT immediate b/l LE. Palpable DP/PT pulses b/l LE. Digital hair present b/l. Skin temperature gradient WNL b/l. No pain with calf compression b/l. No edema noted b/l. No cyanosis or clubbing noted b/l LE.  Dermatological Examination: Pedal integument with normal turgor, texture and tone b/l LE. No open wounds b/l. No interdigital macerations b/l.  No hyperkeratotic or porokeratotic lesions present.  She does have nail dystrophy with thickened nails.  Musculoskeletal Examination: Normal muscle strength 5/5 to all lower extremity muscle groups bilaterally. No pain, crepitus or joint limitation noted with ROM b/l LE. No gross bony pedal deformities b/l. Patient ambulates independently without assistive aids.  Footwear Assessment: Does the patient  wear appropriate shoes?  Yes. Does the patient need inserts/orthotics?  No.  Neurological Examination: Protective sensation inconsistent but mostly intact with 10 gram monofilament b/l. Vibratory sensation intact b/l. Proprioception intact bilaterally.  Hemoglobin A1C Latest Ref Rng & Units 09/01/2020 02/27/2020  HGBA1C 0.0 - 7.0 % 6.6 6.8(A)  Some recent data might be hidden     Assessment: 1. Nail dystrophy   2. Encounter for diabetic foot exam ([place])   3. Diabetic peripheral neuropathy (HCC)      ADA Risk Categorization: Low Risk :  Patient has all of the following: Intact protective sensation No prior foot ulcer  No severe deformity Pedal pulses present   Plan: -Examined patient. -Diabetic foot examination performed today. -Continue diabetic foot care principles: inspect feet daily, monitor glucose as recommended by PCP and/or Endocrinologist, and follow prescribed diet per PCP, Endocrinologist and/or dietician. -Patient/POA to call should there be question/concern in the interim. - Return in about 1 year (around 11/23/2021) for at risk diabetic foot examination .  Criselda Peaches, DPM

## 2020-12-01 ENCOUNTER — Ambulatory Visit: Payer: 59 | Admitting: Physical Therapy

## 2020-12-01 ENCOUNTER — Other Ambulatory Visit: Payer: Self-pay

## 2020-12-01 ENCOUNTER — Encounter: Payer: Self-pay | Admitting: Physical Therapy

## 2020-12-01 DIAGNOSIS — M545 Low back pain, unspecified: Secondary | ICD-10-CM | POA: Diagnosis not present

## 2020-12-01 DIAGNOSIS — G8929 Other chronic pain: Secondary | ICD-10-CM

## 2020-12-01 DIAGNOSIS — R2689 Other abnormalities of gait and mobility: Secondary | ICD-10-CM

## 2020-12-01 DIAGNOSIS — M6281 Muscle weakness (generalized): Secondary | ICD-10-CM

## 2020-12-01 NOTE — Therapy (Signed)
[facility] Outpatient Rehabilitation [facility] [address] [place], [place], [address] Phone: [phone]   Fax:  [phone]  Physical Therapy Treatment  Patient Details  Name: Denise Hale MRN: [id number] Date of Birth: 1970-03-30 Referring Provider (PT): Hoy Register, MD   Encounter Date: 12/01/2020   PT End of Session - 12/01/20 1146     Visit Number 7    Number of Visits 12    Date for PT Re-Evaluation 12/22/20    Authorization Type [employer]    Authorization Time Period FOTO by 6th    PT Start Time 1139    PT Stop Time 1222   5 min TPDN performed   PT Time Calculation (min) 43 min    Activity Tolerance Patient tolerated treatment well    Behavior During Therapy [facility] for tasks assessed/performed             Past Medical History:  Diagnosis Date   Anxiety    Asthma    Diabetes mellitus without complication (HCC)    Diverticulitis    GERD (gastroesophageal reflux disease)     Past Surgical History:  Procedure Laterality Date   CARPAL TUNNEL RELEASE Right 04/08/2020   Procedure: RIGHT CARPAL TUNNEL RELEASE;  Surgeon: Tarry Kos, MD;  Location: [facility];  Service: Orthopedics;  Laterality: Right;   CHOLECYSTECTOMY     COLON SURGERY      There were no vitals filed for this visit.   Subjective Assessment - 12/01/20 1143     Subjective Patient reports she is doing well, she has been working a lot recently, states working about 6 days per week. She does state she is feeling good, and her exercises are going well.    Patient Stated Goals Get rid of pain    Currently in Pain? Yes    Pain Score 3     Pain Location Back    Pain Orientation Left;Lower    Pain Descriptors / Indicators Aching;Tightness;Sore    Pain Type Chronic pain    Pain Onset More than a month ago    Pain Frequency Constant    Aggravating Factors  General movement, sitting, sleeping, standing extended periods                [facility] PT Assessment  - 12/01/20 0001       AROM   Overall AROM Comments Lumbar motion gross WFL but continues to report left sided low back tightness, reduced following TPDN      Strength   Left Hip Extension 3/5                 [facility] Adult PT Treatment/Exercise:   Therapeutic Exercise: NuStep L5 x 3 min with LE while taking subjective LTR 3 x 10 sec Supine SKTC x 30 sec Piriformis stretch x 30 sec Bridge 5 x 5 sec hold Sidelying hip abduction 2 x 10 Child's pose x 30 sec Bird dog x 5 - performed on forearms Deadlift 25# from 6" box 2 x 10   Manual Therapy: Skilled palpation and monitoring while performing TPDN STM and myofascial release of left lumbar paraspinals, piriformis and gluteal region   Neuromuscular re-ed: N/A   Therapeutic Activity: N/A   Modalities: N/A   Self Care: N/A   Trigger Point Dry Needling Treatment: Pre-treatment instruction: Patient instructed on dry needling rationale, procedures, and possible side effects including pain during treatment (achy,cramping feeling), bruising, drop of blood, lightheadedness, nausea, sweating. Patient Consent Given: Yes Education  handout provided: No Muscles treated: left lumbar multifidus, glute med/min, piriformis  Needle size and number: .30x86mm x 4 Electrical stimulation performed: No Parameters: N/A Treatment response/outcome: Twitch response elicited, Palpable decrease in muscle tension, and patient reports reduce muscular tightness Post-treatment instructions: Patient instructed to expect possible mild to moderate muscle soreness later today and/or tomorrow. Patient instructed in methods to reduce muscle soreness and to continue prescribed HEP. If patient was dry needled over the lung field, patient was instructed on signs and symptoms of pneumothorax and, however unlikely, to see immediate medical attention should they occur. Patient was also educated on signs and symptoms of infection and to seek medical attention  should they occur. Patient verbalized understanding of these instructions and education.                   PT Education - 12/01/20 1145     Education Details HEP    Person(s) Educated Patient    Methods Explanation;Demonstration;Verbal cues    Comprehension Verbalized understanding;Returned demonstration;Verbal cues required;Need further instruction              PT Short Term Goals - 11/10/20 1319       PT SHORT TERM GOAL #1   Title Patient will be I with initial HEP to progress with PT    Baseline independent with initial HEP    Time 3    Period Weeks    Status Achieved      PT SHORT TERM GOAL #2   Title PT will review FOTO with patient by 3rd visit to understand expected progress    Baseline reviewed 2nd visit    Time 3    Period Weeks    Status Achieved    Target Date 10/08/20      PT SHORT TERM GOAL #3   Title Patient will demonstrate lumbar AROM grossly WFL and non painful to improve lifting ability at work    Baseline lumbar motion grossly WFL but continues with left side tightness    Time 3    Period Weeks    Status On-going    Target Date 12/01/20      PT SHORT TERM GOAL #4   Title Patient will demonstrate ambulation without antalgic gait to improve community mobility    Baseline gait grossly WFL    Time 4    Period Weeks    Status Achieved               PT Long Term Goals - 11/10/20 1320       PT LONG TERM GOAL #1   Title Patient will be I with final HEP to maintain progress from PT    Baseline progressing with HEP    Time 6    Period Weeks    Status On-going    Target Date 12/22/20      PT LONG TERM GOAL #2   Title Patient will be able to sit >/= 30 minutes without increase in pain to reduce functional limitation    Baseline patient continues to report limitation with sitting    Time 6    Period Weeks    Status On-going    Target Date 12/22/20      PT LONG TERM GOAL #3   Title Patient will exhibit core and hip  strength grossly >/= 4/5 MMT in order to reduce pain and improve tolerance for standing at work    Baseline patient continues to demonstrate gross core and hip strength deficit  Time 6    Period Weeks    Status On-going    Target Date 12/22/20      PT LONG TERM GOAL #4   Title Patient will report improved functional status >/= 64% on FOTO to indicate improve functonal ability    Baseline 63%    Time 6    Period Weeks    Status On-going    Target Date 12/22/20                   Plan - 12/01/20 1147     Clinical Impression Statement Patient tolerated therapy well with no adverse effects. Continued with TPDN for left lumbar and gluteal region with good benefit and patient reporting improvement in symptoms. Therapy continues to focus on progressing flexibility and strength with good tolerance. She was able to to progress weight with her lifting and maintain proper mechanics. No change made to HEP this visit. Patient would benefit from continued skilled PT to progress her mobility and strength in order to reduce pain, normalize gait, and maximize her functional ability.    PT Treatment/Interventions ADLs/Self Care Home Management;Aquatic Therapy;Cryotherapy;Electrical Stimulation;Iontophoresis 4mg /ml Dexamethasone;Moist Heat;Traction;Ultrasound;Neuromuscular re-education;Balance training;Therapeutic exercise;Therapeutic activities;Functional mobility training;Stair training;Gait training;Patient/family education;Manual techniques;Dry needling;Passive range of motion;Taping;Vasopneumatic Device;Spinal Manipulations;Joint Manipulations    PT Next Visit Plan Review HEP and progress PRN, manual/dry needling for left lumbar paraspinals, progress core and hip strengthening, lifting    PT Home Exercise Plan    Consulted and Agree with Plan of Care Patient             Patient will benefit from skilled therapeutic intervention in order to improve the following deficits and  impairments:  Abnormal gait, Decreased range of motion, Pain, Decreased activity tolerance, Decreased strength  Visit Diagnosis: Chronic left-sided low back pain, unspecified whether sciatica present  Muscle weakness (generalized)  Other abnormalities of gait and mobility     Problem List Patient Active Problem List   Diagnosis Date Noted   Vitreomacular adhesion of both eyes 11/04/2020   Moderate nonproliferative diabetic retinopathy of both eyes without macular edema associated with type 2 diabetes mellitus (HCC) 11/04/2020   Nuclear sclerotic cataract of both eyes 11/04/2020   Carpal tunnel syndrome on right 04/07/2020   DM2 (diabetes mellitus, type 2) (HCC) 10/30/2019   Influenza vaccine needed 10/30/2019    11/01/2019, PT, DPT, LAT, ATC 12/01/20  12:29 PM Phone: [phone] Fax: [phone]   [facility] Outpatient Rehabilitation [facility] [address] [place], [place], [place] Phone: [phone]   Fax:  [phone]  Name: Denise Hale MRN: [name] Date of Birth: 15-Dec-1970

## 2020-12-08 ENCOUNTER — Other Ambulatory Visit: Payer: Self-pay

## 2020-12-08 ENCOUNTER — Ambulatory Visit (INDEPENDENT_AMBULATORY_CARE_PROVIDER_SITE_OTHER): Payer: 59 | Admitting: Orthopaedic Surgery

## 2020-12-08 ENCOUNTER — Encounter: Payer: Self-pay | Admitting: Orthopaedic Surgery

## 2020-12-08 DIAGNOSIS — M65341 Trigger finger, right ring finger: Secondary | ICD-10-CM

## 2020-12-08 DIAGNOSIS — M65332 Trigger finger, left middle finger: Secondary | ICD-10-CM

## 2020-12-08 NOTE — Progress Notes (Signed)
Office Visit Note   Patient: Denise Hale           Date of Birth: Mar 02, 1970           MRN: [id number] Visit Date: 12/08/2020              Requested by: Claiborne Rigg, NP [address] [place],  [place] [address] PCP: Claiborne Rigg, NP   Assessment & Plan: Visit Diagnoses:  1. Trigger finger, right ring finger   2. Trigger finger, left middle finger     Plan: Impression is right ring trigger finger and left middle trigger finger.  For the right ring trigger finger she is not interested in any other injections and at this point would like to move forward with surgical release.  We discussed the risk benefits rehab recovery related to surgery.  All questions answered to her satisfaction.  We will perform a left long finger trigger injection in the operating theater.  Follow-Up Instructions: No follow-ups on file.   Orders:  No orders of the defined types were placed in this encounter.  No orders of the defined types were placed in this encounter.     Procedures: No procedures performed   Clinical Data: No additional findings.   Subjective: Chief Complaint  Patient presents with   Right Hand - Pain   Left Hand - Pain    Denise Hale returns today for follow-up of right ring trigger finger and left middle trigger finger.  The right ring trigger finger is much worse.  We did an injection about a year ago which only helped for short period of time.  She is not interested in any other injections.   Review of Systems  Constitutional: Negative.   HENT: Negative.    Eyes: Negative.   Respiratory: Negative.    Cardiovascular: Negative.   Endocrine: Negative.   Musculoskeletal: Negative.   Neurological: Negative.   Hematological: Negative.   Psychiatric/Behavioral: Negative.    All other systems reviewed and are negative.   Objective: Vital Signs: There were no vitals taken for this visit.  Physical Exam Vitals and nursing note reviewed.  Constitutional:       Appearance: She is well-developed.  Pulmonary:     Effort: Pulmonary effort is normal.  Skin:    General: Skin is warm.     Capillary Refill: Capillary refill takes less than 2 seconds.  Neurological:     Mental Status: She is alert and oriented to person, place, and time.  Psychiatric:        Behavior: Behavior normal.        Thought Content: Thought content normal.        Judgment: Judgment normal.    Ortho Exam  Exams of the right hand and left hand are unchanged.  Specialty Comments:  No specialty comments available.  Imaging: No results found.   PMFS History: Patient Active Problem List   Diagnosis Date Noted   Vitreomacular adhesion of both eyes 11/04/2020   Moderate nonproliferative diabetic retinopathy of both eyes without macular edema associated with type 2 diabetes mellitus (HCC) 11/04/2020   Nuclear sclerotic cataract of both eyes 11/04/2020   Carpal tunnel syndrome on right 04/07/2020   DM2 (diabetes mellitus, type 2) (HCC) 10/30/2019   Influenza vaccine needed 10/30/2019   Past Medical History:  Diagnosis Date   Anxiety    Asthma    Diabetes mellitus without complication (HCC)    Diverticulitis    GERD (gastroesophageal reflux  disease)     Family History  Problem Relation Age of Onset   Diabetes Mother    Thyroid disease Mother    Heart disease Father    Breast cancer Paternal Aunt    Breast cancer Paternal Aunt     Past Surgical History:  Procedure Laterality Date   CARPAL TUNNEL RELEASE Right 04/08/2020   Procedure: RIGHT CARPAL TUNNEL RELEASE;  Surgeon: Tarry Kos, MD;  Location: [facility];  Service: Orthopedics;  Laterality: Right;   CHOLECYSTECTOMY     COLON SURGERY     Social History   Occupational History   Not on file  Tobacco Use   Smoking status: Former   Smokeless tobacco: Never  Vaping Use   Vaping Use: Never used  Substance and Sexual Activity   Alcohol use: Not Currently    Comment: occasionally    Drug use: Yes    Types: Marijuana   Sexual activity: Yes    Birth control/protection: None

## 2020-12-09 ENCOUNTER — Other Ambulatory Visit: Payer: Self-pay

## 2020-12-09 ENCOUNTER — Ambulatory Visit: Payer: 59 | Attending: Family Medicine | Admitting: Physical Therapy

## 2020-12-09 ENCOUNTER — Encounter: Payer: Self-pay | Admitting: Physical Therapy

## 2020-12-09 DIAGNOSIS — M6281 Muscle weakness (generalized): Secondary | ICD-10-CM | POA: Insufficient documentation

## 2020-12-09 DIAGNOSIS — G8929 Other chronic pain: Secondary | ICD-10-CM | POA: Diagnosis present

## 2020-12-09 DIAGNOSIS — R2689 Other abnormalities of gait and mobility: Secondary | ICD-10-CM | POA: Insufficient documentation

## 2020-12-09 DIAGNOSIS — M545 Low back pain, unspecified: Secondary | ICD-10-CM | POA: Insufficient documentation

## 2020-12-09 NOTE — Therapy (Signed)
[facility] Outpatient Rehabilitation [facility] [address] [place], [place], [address] Phone: [phone]   Fax:  [phone]  Physical Therapy Treatment  Patient Details  Name: Isobella Ascher MRN: [id number] Date of Birth: 1970/11/20 Referring Provider (PT): Hoy Register, MD   Encounter Date: 12/09/2020   PT End of Session - 12/09/20 1141     Visit Number 8    Number of Visits 12    Date for PT Re-Evaluation 12/22/20    Authorization Type [employer]    PT Start Time 1134    PT Stop Time 1218   6 min TPDN performed   PT Time Calculation (min) 44 min    Activity Tolerance Patient tolerated treatment well    Behavior During Therapy [facility] for tasks assessed/performed             Past Medical History:  Diagnosis Date   Anxiety    Asthma    Diabetes mellitus without complication (HCC)    Diverticulitis    GERD (gastroesophageal reflux disease)     Past Surgical History:  Procedure Laterality Date   CARPAL TUNNEL RELEASE Right 04/08/2020   Procedure: RIGHT CARPAL TUNNEL RELEASE;  Surgeon: Tarry Kos, MD;  Location: [facility];  Service: Orthopedics;  Laterality: Right;   CHOLECYSTECTOMY     COLON SURGERY      There were no vitals filed for this visit.   Subjective Assessment - 12/09/20 1139     Subjective Patient reports her back is treating her a little better. She did have a little pain followin last visit but has since then the pain has eased up.    Patient Stated Goals Get rid of pain    Currently in Pain? Yes    Pain Score 1     Pain Location Back    Pain Orientation Left;Lower    Pain Descriptors / Indicators Tightness    Pain Type Chronic pain    Pain Onset More than a month ago    Pain Frequency Constant    Aggravating Factors  General movement, sitting, sleeping, standing extended periods                [facility] PT Assessment - 12/09/20 0001       Observation/Other Assessments   Focus on Therapeutic Outcomes  (FOTO)  71% functional status                    [facility] Adult PT Treatment/Exercise:   Therapeutic Exercise: NuStep L5 x 5 min with LE while taking subjective Child's pose x 30 sec forward, x 30 sec with left bias LTR 3 x 10 sec each Bridge 2 x 10 x 5 sec hold Sidelying hip abduction 2 x 15 each Deadlift 25# from 6" box 2 x 10 Spring board standing bar extension with yellow elastics 10 x 5 sec Spring board pallof press with yellow spring 10 x 5 sec each   Manual Therapy: Skilled palpation and monitoring while performing TPDN STM and myofascial release of left lumbar paraspinals   Neuromuscular re-ed: N/A   Therapeutic Activity: N/A   Modalities: N/A   Self Care: N/A  Trigger Point Dry Needling Treatment: Pre-treatment instruction: Patient instructed on dry needling rationale, procedures, and possible side effects including pain during treatment (achy,cramping feeling), bruising, drop of blood, lightheadedness, nausea, sweating. Patient Consent Given: Yes Education handout provided: No Muscles treated: left QL and lumbar iliocostalis Needle size and number: .30x86mm x 1 and .30x169mm x  2 Electrical stimulation performed: No Parameters: N/A Treatment response/outcome: Twitch response elicited, Palpable decrease in muscle tension, and patient report improvement in symptoms Post-treatment instructions: Patient instructed to expect possible mild to moderate muscle soreness later today and/or tomorrow. Patient instructed in methods to reduce muscle soreness and to continue prescribed HEP. If patient was dry needled over the lung field, patient was instructed on signs and symptoms of pneumothorax and, however unlikely, to see immediate medical attention should they occur. Patient was also educated on signs and symptoms of infection and to seek medical attention should they occur. Patient verbalized understanding of these instructions and  education.               PT Education - 12/09/20 1140     Education Details HEP, FOTO    Person(s) Educated Patient    Methods Explanation;Demonstration;Verbal cues    Comprehension Verbalized understanding;Returned demonstration;Verbal cues required;Need further instruction              PT Short Term Goals - 11/10/20 1319       PT SHORT TERM GOAL #1   Title Patient will be I with initial HEP to progress with PT    Baseline independent with initial HEP    Time 3    Period Weeks    Status Achieved      PT SHORT TERM GOAL #2   Title PT will review FOTO with patient by 3rd visit to understand expected progress    Baseline reviewed 2nd visit    Time 3    Period Weeks    Status Achieved    Target Date 10/08/20      PT SHORT TERM GOAL #3   Title Patient will demonstrate lumbar AROM grossly WFL and non painful to improve lifting ability at work    Baseline lumbar motion grossly WFL but continues with left side tightness    Time 3    Period Weeks    Status On-going    Target Date 12/01/20      PT SHORT TERM GOAL #4   Title Patient will demonstrate ambulation without antalgic gait to improve community mobility    Baseline gait grossly WFL    Time 4    Period Weeks    Status Achieved               PT Long Term Goals - 12/09/20 1215       PT LONG TERM GOAL #1   Title Patient will be I with final HEP to maintain progress from PT    Baseline progressing with HEP    Time 6    Period Weeks    Status On-going    Target Date 12/22/20      PT LONG TERM GOAL #2   Title Patient will be able to sit >/= 30 minutes without increase in pain to reduce functional limitation    Baseline patient continues to report limitation with sitting    Time 6    Period Weeks    Status On-going    Target Date 12/22/20      PT LONG TERM GOAL #3   Title Patient will exhibit core and hip strength grossly >/= 4/5 MMT in order to reduce pain and improve tolerance for  standing at work    Baseline patient continues to demonstrate gross core and hip strength deficit    Time 6    Period Weeks    Status On-going    Target Date 12/22/20  PT LONG TERM GOAL #4   Title Patient will report improved functional status >/= 64% on FOTO to indicate improve functonal ability    Baseline 71%    Time 6    Period Weeks    Status Achieved    Target Date 12/22/20                   Plan - 12/09/20 1141     Clinical Impression Statement Patient tolerated therapy well with no adverse effects. Continued with TPDN this visit targeting QL and lumbar paraspinals with good benefit. Patient reported reduced low back tightness following treatment. Therapy continued progression of core and hip strengthening with good tolerance and patient is progressing well with her lifting. She reports improvement in overall function and has achieved FOTO goal. Patient would benefit from continued skilled PT to progress her mobility and strength in order to reduce pain, normalize gait, and maximize her functional ability.    PT Treatment/Interventions ADLs/Self Care Home Management;Aquatic Therapy;Cryotherapy;Electrical Stimulation;Iontophoresis 4mg /ml Dexamethasone;Moist Heat;Traction;Ultrasound;Neuromuscular re-education;Balance training;Therapeutic exercise;Therapeutic activities;Functional mobility training;Stair training;Gait training;Patient/family education;Manual techniques;Dry needling;Passive range of motion;Taping;Vasopneumatic Device;Spinal Manipulations;Joint Manipulations    PT Next Visit Plan Review HEP and progress PRN, manual/dry needling for left lumbar paraspinals, progress core and hip strengthening, lifting    PT Home Exercise Plan    Consulted and Agree with Plan of Care Patient             Patient will benefit from skilled therapeutic intervention in order to improve the following deficits and impairments:  Abnormal gait, Decreased range of motion,  Pain, Decreased activity tolerance, Decreased strength  Visit Diagnosis: Chronic left-sided low back pain, unspecified whether sciatica present  Muscle weakness (generalized)  Other abnormalities of gait and mobility     Problem List Patient Active Problem List   Diagnosis Date Noted   Vitreomacular adhesion of both eyes 11/04/2020   Moderate nonproliferative diabetic retinopathy of both eyes without macular edema associated with type 2 diabetes mellitus (HCC) 11/04/2020   Nuclear sclerotic cataract of both eyes 11/04/2020   Carpal tunnel syndrome on right 04/07/2020   DM2 (diabetes mellitus, type 2) (HCC) 10/30/2019   Influenza vaccine needed 10/30/2019    11/01/2019, PT, DPT, LAT, ATC 12/09/20  12:23 PM Phone: [phone] Fax: [phone]   [facility] Outpatient Rehabilitation [facility] [address] [place], [place], [place] Phone: [phone]   Fax:  [phone]  Name: Bree Heinzelman MRN: [name] Date of Birth: 1970-07-05

## 2020-12-10 ENCOUNTER — Other Ambulatory Visit: Payer: Self-pay | Admitting: Nurse Practitioner

## 2020-12-10 DIAGNOSIS — F419 Anxiety disorder, unspecified: Secondary | ICD-10-CM

## 2020-12-10 NOTE — Telephone Encounter (Signed)
Requested medication (s) are due for refill today:   No  Requested medication (s) are on the active medication list:   yes  Future visit scheduled:   Yes in 5 days   Last ordered: 11/18/2020 #30, 3 refills  Returned because a 90 day supply is being requested.   Requested Prescriptions  Pending Prescriptions Disp Refills   citalopram (CELEXA) 10 MG tablet [Pharmacy Med Name: CITALOPRAM HBR 10 MG TABLET] 90 tablet 2    Sig: TAKE 1 TABLET BY MOUTH EVERY DAY     Psychiatry:  Antidepressants - SSRI Passed - 12/10/2020  9:34 AM      Passed - Valid encounter within last 6 months    Recent Outpatient Visits           3 weeks ago Peripheral neuropathic pain   [place] [facility] [place], [place] W, NP   3 months ago Type 2 diabetes mellitus with diabetic polyneuropathy, with long-term current use of insulin (HCC)   [place] [facility] [place], [place] W, NP   3 months ago Chronic bilateral low back pain with left-sided sciatica   [place] [facility] [place], Odette Horns, MD   5 months ago Type 2 diabetes mellitus with diabetic polyneuropathy, with long-term current use of insulin [facility])   [place] [facility] [place], Shea Stakes, NP   6 months ago Chronic vaginitis   [facility] [place] [facility] Marcine Matar, MD       Future Appointments             In 5 days Claiborne Rigg, NP [facility]   In 2 months Claiborne Rigg, NP [facility] [employer] [facility]

## 2020-12-15 ENCOUNTER — Ambulatory Visit: Payer: 59 | Attending: Nurse Practitioner | Admitting: Nurse Practitioner

## 2020-12-15 ENCOUNTER — Telehealth: Payer: Self-pay | Admitting: Nurse Practitioner

## 2020-12-15 ENCOUNTER — Other Ambulatory Visit: Payer: Self-pay

## 2020-12-15 ENCOUNTER — Encounter: Payer: Self-pay | Admitting: Nurse Practitioner

## 2020-12-15 DIAGNOSIS — F419 Anxiety disorder, unspecified: Secondary | ICD-10-CM

## 2020-12-15 DIAGNOSIS — F32A Depression, unspecified: Secondary | ICD-10-CM | POA: Diagnosis not present

## 2020-12-15 MED ORDER — CITALOPRAM HYDROBROMIDE 20 MG PO TABS: 20.0000 mg | ORAL_TABLET | Freq: Every day | ORAL | 0 refills | Status: DC

## 2020-12-15 NOTE — Telephone Encounter (Signed)
NO answer LVM 

## 2020-12-15 NOTE — Telephone Encounter (Signed)
No answer. LVM ?

## 2020-12-15 NOTE — Progress Notes (Signed)
Virtual Visit via Telephone Note Due to national recommendations of social distancing due to COVID 19, telehealth visit is felt to be most appropriate for this patient at this time.  I discussed the limitations, risks, security and privacy concerns of performing an evaluation and management service by telephone and the availability of in person appointments. I also discussed with the patient that there may be a patient responsible charge related to this service. The patient expressed understanding and agreed to proceed.    I connected with Denise Hale on 12/15/20  at  10:10 AM EST  EDT by telephone and verified that I am speaking with the correct person using two identifiers.  [name]   Location of Provider: [facility]    Persons participating in Telemedicine visit: Bertram Denver FNP-BC Rachelle Bosque    History of Present Illness: Telemedicine visit for: F/U Depression and Anxiety  Currently taking celexa 10 mg which was prescribed for her last month. She states she has noticed some slight improvement in her mood. Would still like to speak to at therapist/psychiatrist.  Depression screen [facility] 2/9 12/15/2020 11/18/2020 09/01/2020  Decreased Interest 2 1 1   Down, Depressed, Hopeless 2 1 0  PHQ - 2 Score 4 2 1   Altered sleeping 1 3 1   Tired, decreased energy 0 1 1  Change in appetite 2 1 2   Feeling bad or failure about yourself  1 0 0  Trouble concentrating 2 2 1   Moving slowly or fidgety/restless 2 1 1   Suicidal thoughts 0 0 0  PHQ-9 Score 12 10 7   Difficult doing work/chores Very difficult Not difficult at all Not difficult at all  Some recent data might be hidden       Past Medical History:  Diagnosis Date   Anxiety    Asthma    Diabetes mellitus without complication (HCC)    Diverticulitis    GERD (gastroesophageal reflux disease)     Past Surgical History:  Procedure Laterality Date   CARPAL TUNNEL  RELEASE Right 04/08/2020   Procedure: RIGHT CARPAL TUNNEL RELEASE;  Surgeon: , MD;  Location: [facility];  Service: Orthopedics;  Laterality: Right;   CHOLECYSTECTOMY     COLON SURGERY      Family History  Problem Relation Age of Onset   Diabetes Mother    Thyroid disease Mother    Heart disease Father    Breast cancer Paternal Aunt    Breast cancer Paternal Aunt     Social History   Socioeconomic History   Marital status: Unknown    Spouse name: Not on file   Number of children: 2   Years of education: Not on file   Highest education level: Some college, no degree  Occupational History   Not on file  Tobacco Use   Smoking status: Former   Smokeless tobacco: Never  Use: Never used  Substance and Sexual Activity   Alcohol use: Not Currently    Comment: occasionally   Drug use: Yes    Types: Marijuana   Sexual activity: Yes    Birth control/protection: None  Other Topics Concern   Not on file  Social History Narrative   Not on file   Social Determinants of Health   Financial Resource Strain: Not on file  Food Insecurity: Not on file  Transportation Needs: Not on file  Physical Activity: Not on file  Stress: Not on file  Social Connections: Not on file     Observations/Objective: Awake, alert and oriented x 3   Review of Systems  Constitutional:  Negative for fever, malaise/fatigue and weight loss.  HENT: Negative.  Negative for nosebleeds.   Eyes: Negative.  Negative for blurred vision, double vision and photophobia.  Respiratory: Negative.  Negative for cough and shortness of breath.   Cardiovascular: Negative.  Negative for chest pain, palpitations and leg swelling.  Gastrointestinal: Negative.  Negative for heartburn, nausea and vomiting.  Musculoskeletal: Negative.  Negative for myalgias.  Neurological: Negative.  Negative for dizziness, focal weakness, seizures and headaches.  Psychiatric/Behavioral:   Positive for depression. Negative for suicidal ideas. The patient is nervous/anxious.    Assessment and Plan: Diagnoses and all orders for this visit:  Anxiety and depression -     citalopram (CELEXA) 20 MG tablet; Take 1 tablet (20 mg total) by mouth daily.  Increased to 20mg  today   Follow Up Instructions Return if symptoms worsen or fail to improve.      I discussed the assessment and treatment plan with the patient. The patient was provided an opportunity to ask questions and all were answered. The patient agreed with the plan and demonstrated an understanding of the instructions.   The patient was advised to call back or seek an in-person evaluation if the symptoms worsen or if the condition fails to improve as anticipated.  I provided 10 minutes of non-face-to-face time during this encounter including median intraservice time, reviewing previous notes, labs, imaging, medications and explaining diagnosis and management.  , FNP-BC

## 2020-12-16 ENCOUNTER — Ambulatory Visit: Payer: 59 | Admitting: Physical Therapy

## 2020-12-18 ENCOUNTER — Other Ambulatory Visit: Payer: Self-pay | Admitting: Physician Assistant

## 2020-12-18 ENCOUNTER — Telehealth: Payer: Self-pay | Admitting: Orthopaedic Surgery

## 2020-12-18 ENCOUNTER — Other Ambulatory Visit: Payer: Self-pay | Admitting: Internal Medicine

## 2020-12-18 DIAGNOSIS — E1142 Type 2 diabetes mellitus with diabetic polyneuropathy: Secondary | ICD-10-CM

## 2020-12-18 DIAGNOSIS — E785 Hyperlipidemia, unspecified: Secondary | ICD-10-CM

## 2020-12-18 DIAGNOSIS — Z794 Long term (current) use of insulin: Secondary | ICD-10-CM

## 2020-12-18 DIAGNOSIS — N761 Subacute and chronic vaginitis: Secondary | ICD-10-CM

## 2020-12-18 NOTE — Telephone Encounter (Signed)
Pt called requesting medication for her hands. She is do for surgrey but her hands are in severe pain. Please send meds to [facility]. Please call pt when call in. Pt phone number is [phone].

## 2020-12-18 NOTE — Telephone Encounter (Signed)
patient cancelling trigger finger release surgery at [facility] with Dr. Roda Shutters 12-23-20.  She has [employer] and is waiting until she has the new policy at the beginning of the year.   She feels too rushed before the holidays.

## 2020-12-19 NOTE — Telephone Encounter (Signed)
Requested Prescriptions  Pending Prescriptions Disp Refills   lisinopril (ZESTRIL) 5 MG tablet [Pharmacy Med Name: LISINOPRIL 5 MG TABLET] 90 tablet 3    Sig: TAKE 1 TABLET (5 MG TOTAL) BY MOUTH DAILY.     Cardiovascular:  ACE Inhibitors Failed - 12/18/2020  5:37 PM      Failed - Cr in normal range and within 180 days    Creatinine, Ser  Date Value Ref Range Status  09/01/2020 0.55 (L) 0.57 - 1.00 mg/dL Final         Passed - K in normal range and within 180 days    Potassium  Date Value Ref Range Status  09/01/2020 4.4 3.5 - 5.2 mmol/L Final         Passed - Patient is not pregnant      Passed - Last BP in normal range    BP Readings from Last 1 Encounters:  11/18/20 130/83         Passed - Valid encounter within last 6 months    Recent Outpatient Visits          4 days ago Anxiety and depression   Denise Hale [place], Denise Stakes, Denise Hale   1 month ago Peripheral neuropathic pain   Denise Hale Denise Hale, Denise Stakes, Denise Hale   3 months ago Type 2 diabetes mellitus with diabetic polyneuropathy, with long-term current use of insulin (HCC)   Denise Hale Denise Hale, Denise Hale, Denise Hale   4 months ago Chronic bilateral low back pain with left-sided sciatica   Denise Hale Denise Hale, Denise Horns, Denise Hale   5 months ago Type 2 diabetes mellitus with diabetic polyneuropathy, with long-term current use of insulin (HCC)   Denise Hale Denise Hale, Denise Stakes, Denise Hale      Future Appointments            In 1 month Denise Rigg, Denise Hale [facility]            atorvastatin (LIPITOR) 20 MG tablet [Pharmacy Med Name: ATORVASTATIN 20 MG TABLET] 90 tablet 3    Sig: TAKE 1 TABLET BY MOUTH EVERY DAY     Cardiovascular:  Antilipid - Statins Failed - 12/18/2020  5:37 PM      Failed - Total Cholesterol in normal range and within 360 days    Cholesterol, Total   Date Value Ref Range Status  10/30/2019 144 100 - 199 mg/dL Final         Failed - LDL in normal range and within 360 days    LDL Chol Calc (NIH)  Date Value Ref Range Status  10/30/2019 58 0 - 99 mg/dL Final         Failed - HDL in normal range and within 360 days    HDL  Date Value Ref Range Status  10/30/2019 71 >39 mg/dL Final         Failed - Triglycerides in normal range and within 360 days    Triglycerides  Date Value Ref Range Status  10/30/2019 80 0 - 149 mg/dL Final         Passed - Patient is not pregnant      Passed - Valid encounter within last 12 months    Recent Outpatient Visits          4 days ago Anxiety and depression   Denise Hale [place],  Denise Stakes, Denise Hale   1 month ago Peripheral neuropathic pain   [facility] [place], Denise Hale, Denise Hale   3 months ago Type 2 diabetes mellitus with diabetic polyneuropathy, with long-term current use of insulin [facility])   [facility] [place], Denise Hale, Denise Hale   4 months ago Chronic bilateral low back pain with left-sided sciatica   [place] [facility] [place], Denise Horns, Denise Hale   5 months ago Type 2 diabetes mellitus with diabetic polyneuropathy, with long-term current use of insulin [facility])   [employer] [facility] Hale [place], Denise Stakes, Denise Hale      Future Appointments            In 1 month Denise Rigg, Denise Hale [place] [facility]            DULoxetine (CYMBALTA) 30 MG capsule [Pharmacy Med Name: DULOXETINE HCL DR 30 MG CAP] 90 capsule 1    Sig: TAKE 1 CAPSULE BY MOUTH EVERY DAY     Psychiatry: Antidepressants - SNRI Passed - 12/18/2020  5:37 PM      Passed - Last BP in normal range    BP Readings from Last 1 Encounters:  11/18/20 130/83         Passed - Valid encounter within last 6 months    Recent Outpatient Visits          4 days ago Anxiety and depression   [place] [facility] [place], Denise Stakes, Denise Hale   1 month ago Peripheral neuropathic pain   [facility] [place], Denise Hale, Denise Hale   3 months ago Type 2 diabetes mellitus with diabetic polyneuropathy, with long-term current use of insulin [facility])   [facility] Hale [place], Denise Hale, Denise Hale   4 months ago Chronic bilateral low back pain with left-sided sciatica   [place] [facility] Hale [place], Denise Horns, Denise Hale   5 months ago Type 2 diabetes mellitus with diabetic polyneuropathy, with long-term current use of insulin [facility] [place])    [facility] Hale [place], Denise Stakes, Denise Hale      Future Appointments            In 1 month Denise Rigg, Denise Hale [facility] [employer] And Hale

## 2020-12-19 NOTE — Telephone Encounter (Signed)
Requested medication (s) are due for refill today: yes  Requested medication (s) are on the active medication list: yes  Last refill:  05/28/20  Future visit scheduled: 02/10/21  Notes to clinic:  No protocol for this med, please assess.   Requested Prescriptions  Pending Prescriptions Disp Refills   nystatin-triamcinolone ointment (MYCOLOG) [Pharmacy Med Name: NYSTATIN-TRIAMCINOLONE OINTM] 30 g 0    Sig: Apply thin film to external vaginal area daily x 5 days.  Do not insert in vagina.     Off-Protocol Failed - 12/18/2020  5:37 PM      Failed - Medication not assigned to a protocol, review manually.      Passed - Valid encounter within last 12 months    Recent Outpatient Visits           4 days ago Anxiety and depression   [facility] [place], [place] W, NP   1 month ago Peripheral neuropathic pain   [facility] [place], [place] W, NP   3 months ago Type 2 diabetes mellitus with diabetic polyneuropathy, with long-term current use of insulin [facility])   [facility] [place], [place] W, NP   4 months ago Chronic bilateral low back pain with left-sided sciatica   [facility] [place], Odette Horns, MD   5 months ago Type 2 diabetes mellitus with diabetic polyneuropathy, with long-term current use of insulin [facility])   [place] [facility] Wellness Claiborne Rigg, NP       Future Appointments             In 1 month Claiborne Rigg, NP [employer] [facility]

## 2020-12-21 ENCOUNTER — Other Ambulatory Visit: Payer: Self-pay | Admitting: Physician Assistant

## 2020-12-21 MED ORDER — TRAMADOL HCL 50 MG PO TABS: 50.0000 mg | ORAL_TABLET | Freq: Three times a day (TID) | ORAL | 2 refills | Status: DC | PRN

## 2020-12-21 NOTE — Telephone Encounter (Signed)
I left voicemail for patient advising. 

## 2020-12-21 NOTE — Telephone Encounter (Signed)
Sent in tramadol

## 2020-12-23 ENCOUNTER — Encounter (HOSPITAL_BASED_OUTPATIENT_CLINIC_OR_DEPARTMENT_OTHER): Admission: RE | Payer: Self-pay | Source: Home / Self Care

## 2020-12-23 ENCOUNTER — Ambulatory Visit (HOSPITAL_BASED_OUTPATIENT_CLINIC_OR_DEPARTMENT_OTHER): Admission: RE | Admit: 2020-12-23 | Payer: 59 | Source: Home / Self Care | Admitting: Orthopaedic Surgery

## 2020-12-23 SURGERY — RELEASE, A1 PULLEY, FOR TRIGGER FINGER
Anesthesia: Monitor Anesthesia Care | Site: Ring Finger | Laterality: Right

## 2020-12-24 ENCOUNTER — Encounter: Payer: Self-pay | Admitting: Physical Therapy

## 2020-12-24 ENCOUNTER — Other Ambulatory Visit: Payer: Self-pay

## 2020-12-24 ENCOUNTER — Other Ambulatory Visit: Payer: Self-pay | Admitting: Nurse Practitioner

## 2020-12-24 ENCOUNTER — Ambulatory Visit: Payer: 59 | Admitting: Physical Therapy

## 2020-12-24 DIAGNOSIS — M792 Neuralgia and neuritis, unspecified: Secondary | ICD-10-CM

## 2020-12-24 DIAGNOSIS — M545 Low back pain, unspecified: Secondary | ICD-10-CM | POA: Diagnosis not present

## 2020-12-24 DIAGNOSIS — R2689 Other abnormalities of gait and mobility: Secondary | ICD-10-CM

## 2020-12-24 DIAGNOSIS — M6281 Muscle weakness (generalized): Secondary | ICD-10-CM

## 2020-12-24 NOTE — Therapy (Signed)
[place], [place], [address] Phone: [phone]   Fax:  [phone]  Physical Therapy Treatment / Discharge  Patient Details  Name: Denise Hale MRN: [id number] Date of Birth: 08/20/1970 Referring Provider (PT): Charlott Rakes, MD   Encounter Date: 12/24/2020   PT End of Session - 12/24/20 1053     Visit Number 9    Number of Visits 12    Date for PT Re-Evaluation 12/24/20    Authorization Type [employer]    PT Start Time 1050    PT Stop Time 1130    PT Time Calculation (min) 40 min    Activity Tolerance Patient tolerated treatment well    Behavior During Therapy [facility] for tasks assessed/performed             Past Medical History:  Diagnosis Date   Anxiety    Asthma    Diabetes mellitus without complication ([place])    Diverticulitis    GERD (gastroesophageal reflux disease)     Past Surgical History:  Procedure Laterality Date   CARPAL TUNNEL RELEASE Right 04/08/2020   Procedure: RIGHT CARPAL TUNNEL RELEASE;  Surgeon: Leandrew Koyanagi, MD;  Location: [facility];  Service: Orthopedics;  Laterality: Right;   CHOLECYSTECTOMY     COLON SURGERY      There were no vitals filed for this visit.   Subjective Assessment - 12/24/20 1056     Subjective Patient reports she is doing well, states she continues to feel better. She is consistent with her stretching and exercises. States she is ready for this to be her last visit.    How long can you sit comfortably? can sit about an hour    Currently in Pain? Yes    Pain Score 1     Pain Location Back    Pain Orientation Left;Lower    Pain Descriptors / Indicators Tightness    Pain Onset More than a month ago    Pain Frequency Constant    Aggravating Factors  General movement, sitting, sleeping, standing extended periods                [facility] PT Assessment - 12/24/20 0001       Assessment   Medical Diagnosis Chronic bilateral  low back pain with left-sided sciatica    Referring Provider (PT) Charlott Rakes, MD      Precautions   Precautions None      Restrictions   Weight Bearing Restrictions No      Balance Screen   Has the patient fallen in the past 6 months No      Prior Function   Level of Independence Independent      Observation/Other Assessments   Focus on Therapeutic Outcomes (FOTO)  71% functional status   assessed on 12/09/20     AROM   Overall AROM Comments Lumbar motion gross WFL but continues to report left sided low back tightness      Strength   Overall Strength Comments Core strength deficit grossly 4-/5    Right Hip Extension 4-/5    Right Hip ABduction 4-/5    Left Hip Extension 4-/5    Left Hip ABduction 4-/5                 [facility] Adult PT Treatment/Exercise:  Therapeutic Exercise: NuStep L5 x 5 min with LE while taking subjective LTR 5 x 10 sec Piriformis stretch 2 x 30 sec Child's pose x  30 sec forward, x 30 sec each side Bridge 2 x 10 with 5 sec hold Sidelying hip abduction 2 x 10 each Bird dog on forearms 2 x 10 with 5 sec hold Deadlift 25# from 6" box 2 x 10          PT Education - 12/24/20 1101     Education Details POC discharge, HEP    Person(s) Educated Patient    Methods Explanation    Comprehension Verbalized understanding              PT Short Term Goals - 12/24/20 1103       PT SHORT TERM GOAL #1   Title Patient will be I with initial HEP to progress with PT    Baseline independent with initial HEP    Time 3    Period Weeks    Status Achieved      PT SHORT TERM GOAL #2   Title PT will review FOTO with patient by 3rd visit to understand expected progress    Baseline reviewed 2nd visit    Time 3    Period Weeks    Status Achieved    Target Date 10/08/20      PT SHORT TERM GOAL #3   Title Patient will demonstrate lumbar AROM grossly WFL and non painful to improve lifting ability at work    Baseline lumbar motion grossly  WFL but continues with left side tightness    Time 3    Period Weeks    Status Partially Met    Target Date 12/01/20      PT SHORT TERM GOAL #4   Title Patient will demonstrate ambulation without antalgic gait to improve community mobility    Baseline gait grossly WFL    Time 4    Period Weeks    Status Achieved               PT Long Term Goals - 12/24/20 1104       PT LONG TERM GOAL #1   Title Patient will be I with final HEP to maintain progress from PT    Baseline progressing with HEP    Time 6    Period Weeks    Status Achieved    Target Date 12/22/20      PT LONG TERM GOAL #2   Title Patient will be able to sit >/= 30 minutes without increase in pain to reduce functional limitation    Baseline patient reports she can sit about an hour    Time 6    Period Weeks    Status Achieved    Target Date 12/22/20      PT LONG TERM GOAL #3   Title Patient will exhibit core and hip strength grossly >/= 4/5 MMT in order to reduce pain and improve tolerance for standing at work    Baseline patient continues to demonstrate gross core and hip strength deficit    Time 6    Period Weeks    Status Partially Met    Target Date 12/22/20      PT LONG TERM GOAL #4   Title Patient will report improved functional status >/= 64% on FOTO to indicate improve functonal ability    Baseline 71%    Time 6    Period Weeks    Status Achieved    Target Date 12/22/20                   Plan -  12/24/20 1101     Clinical Impression Statement Patient tolerated therapy well with no adverse effects. She demonstrates independence with her HEP and has achieved her goal for function on FOTO. She reports readiness for discharge so patient will be discharged from PT ths visit. She does continue to report left side low back tightness and exhibits gross strength deficit but she can continue with her exercise program to improve this. She was instructed in follow-up if needed.    PT  Treatment/Interventions ADLs/Self Care Home Management;Aquatic Therapy;Cryotherapy;Electrical Stimulation;Iontophoresis 14m/ml Dexamethasone;Moist Heat;Traction;Ultrasound;Neuromuscular re-education;Balance training;Therapeutic exercise;Therapeutic activities;Functional mobility training;Stair training;Gait training;Patient/family education;Manual techniques;Dry needling;Passive range of motion;Taping;Vasopneumatic Device;Spinal Manipulations;Joint Manipulations    PT Next Visit Plan NA - discharge    PT Home Exercise Plan [id number]   Consulted and Agree with Plan of Care Patient             Patient will benefit from skilled therapeutic intervention in order to improve the following deficits and impairments:  Abnormal gait, Decreased range of motion, Pain, Decreased activity tolerance, Decreased strength  Visit Diagnosis: Chronic left-sided low back pain, unspecified whether sciatica present  Muscle weakness (generalized)  Other abnormalities of gait and mobility     Problem List Patient Active Problem List   Diagnosis Date Noted   Vitreomacular adhesion of both eyes 11/04/2020   Moderate nonproliferative diabetic retinopathy of both eyes without macular edema associated with type 2 diabetes mellitus ([place] 11/04/2020   Nuclear sclerotic cataract of both eyes 11/04/2020   Carpal tunnel syndrome on right 04/07/2020   DM2 (diabetes mellitus, type 2) ([place] 10/30/2019   Influenza vaccine needed 10/30/2019    CHilda Blades PT, DPT, LAT, ATC 12/24/20  11:34 AM Phone: [phone]: 3[facility] [address] [place] [address]Phone: [phone]  Fax:  [phone] Name: EDunya MeinersMRN: [id number] of Birth: 2October 22, 1972  PHYSICAL THERAPY DISCHARGE SUMMARY  Visits from Start of Care: 9  Current functional level related to goals / functional outcomes: See above   Remaining deficits: See  above   Education / Equipment: HEP   Patient agrees to discharge. Patient goals were partially met. Patient is being discharged due to being pleased with the current functional level.

## 2020-12-24 NOTE — Telephone Encounter (Signed)
Requested medication (s) are due for refill today: Yes Requested medication (s) are on the active medication list: Yes Last refill:11/18/20  Future visit scheduled: Yes   Notes to clinic: Refill can not delegated        Requested Prescriptions  Pending Prescriptions Disp Refills   pregabalin (LYRICA) 50 MG capsule [Pharmacy Med Name: PREGABALIN 50 MG CAPSULE] 60 capsule 0    Sig: TAKE 1 CAPSULE BY MOUTH 2 TIMES DAILY.     Not Delegated - Neurology:  Anticonvulsants - Controlled Failed - 12/24/2020 12:13 PM      Failed - This refill cannot be delegated      Passed - Valid encounter within last 12 months    Recent Outpatient Visits           1 week ago Anxiety and depression   [facility] [place], Shea Stakes, NP   1 month ago Peripheral neuropathic pain   [facility] [place], [place] W, NP   3 months ago Type 2 diabetes mellitus with diabetic polyneuropathy, with long-term current use of insulin [facility])   [place] [facility] [place], [place] W, NP   4 months ago Chronic bilateral low back pain with left-sided sciatica   [place] [facility] [place], Odette Horns, MD   6 months ago Type 2 diabetes mellitus with diabetic polyneuropathy, with long-term current use of insulin [facility])    [facility] Claiborne Rigg, NP       Future Appointments             In 1 month Claiborne Rigg, NP [facility]

## 2020-12-25 ENCOUNTER — Other Ambulatory Visit (INDEPENDENT_AMBULATORY_CARE_PROVIDER_SITE_OTHER): Payer: Self-pay | Admitting: Primary Care

## 2020-12-30 ENCOUNTER — Encounter: Payer: 59 | Admitting: Orthopaedic Surgery

## 2021-01-12 ENCOUNTER — Other Ambulatory Visit: Payer: Self-pay | Admitting: Nurse Practitioner

## 2021-01-12 DIAGNOSIS — E1142 Type 2 diabetes mellitus with diabetic polyneuropathy: Secondary | ICD-10-CM

## 2021-01-12 NOTE — Telephone Encounter (Signed)
Requested Prescriptions  Pending Prescriptions Disp Refills   TRULICITY 1.5 MG/0.5ML SOPN [Pharmacy Med Name: TRULICITY 1.5 MG/0.5 ML PEN] 2 mL 0    Sig: INJECT 1.5 MG INTO THE SKIN ONCE A WEEK.     Endocrinology:  Diabetes - GLP-1 Receptor Agonists Passed - 01/12/2021  1:42 AM      Passed - HBA1C is between 0 and 7.9 and within 180 days    HbA1c, POC (controlled diabetic range)  Date Value Ref Range Status  09/01/2020 6.6 0.0 - 7.0 % Final         Passed - Valid encounter within last 6 months    Recent Outpatient Visits          4 weeks ago Anxiety and depression   [place] [facility] [place], [place] W, NP   1 month ago Peripheral neuropathic pain   [place] [facility] [place], [place] W, NP   4 months ago Type 2 diabetes mellitus with diabetic polyneuropathy, with long-term current use of insulin [facility])   [facility] Wellness [place], [place] W, NP   4 months ago Chronic bilateral low back pain with left-sided sciatica   [place] [facility] And Wellness [place], Odette Horns, MD   6 months ago Type 2 diabetes mellitus with diabetic polyneuropathy, with long-term current use of insulin [facility])   [facility] [place], Shea Stakes, NP      Future Appointments            In 4 weeks Claiborne Rigg, NP [facility] [employer] [facility]

## 2021-01-15 ENCOUNTER — Other Ambulatory Visit: Payer: Self-pay | Admitting: Physician Assistant

## 2021-01-15 ENCOUNTER — Other Ambulatory Visit: Payer: Self-pay | Admitting: Nurse Practitioner

## 2021-01-15 DIAGNOSIS — E1142 Type 2 diabetes mellitus with diabetic polyneuropathy: Secondary | ICD-10-CM

## 2021-01-15 DIAGNOSIS — M792 Neuralgia and neuritis, unspecified: Secondary | ICD-10-CM

## 2021-01-15 DIAGNOSIS — F32A Depression, unspecified: Secondary | ICD-10-CM

## 2021-01-15 DIAGNOSIS — E785 Hyperlipidemia, unspecified: Secondary | ICD-10-CM

## 2021-01-15 NOTE — Telephone Encounter (Signed)
Copied from CRM [id number]. Topic: Quick Communication - Rx Refill/Question >> Jan 15, 2021 10:58 AM Jaquita Rector A wrote: Medication: pregabalin (LYRICA) 50 MG capsule   Has the patient contacted their pharmacy? Yes.  Need new Rx sent today  (Agent: If no, request that the patient contact the pharmacy for the refill. If patient does not wish to contact the pharmacy document the reason why and proceed with request.) (Agent: If yes, when and what did the pharmacy advise?)  Preferred Pharmacy (with phone number or street name): [id number] [place], [place] - [address] [place] [address]  Phone:  [phone] Fax:  [phone]    Has the patient been seen for an appointment in the last year OR does the patient have an upcoming appointment? Yes.    Agent: Please be advised that RX refills may take up to 3 business days. We ask that you follow-up with your pharmacy.

## 2021-01-15 NOTE — Telephone Encounter (Signed)
Requested Prescriptions  Pending Prescriptions Disp Refills   lisinopril (ZESTRIL) 5 MG tablet [Pharmacy Med Name: LISINOPRIL 5 MG TABLET] 90 tablet 0    Sig: TAKE 1 TABLET (5 MG TOTAL) BY MOUTH DAILY.     Cardiovascular:  ACE Inhibitors Failed - 01/15/2021 10:47 AM      Failed - Cr in normal range and within 180 days    Creatinine, Ser  Date Value Ref Range Status  09/01/2020 0.55 (L) 0.57 - 1.00 mg/dL Final         Passed - K in normal range and within 180 days    Potassium  Date Value Ref Range Status  09/01/2020 4.4 3.5 - 5.2 mmol/L Final         Passed - Patient is not pregnant      Passed - Last BP in normal range    BP Readings from Last 1 Encounters:  11/18/20 130/83         Passed - Valid encounter within last 6 months    Recent Outpatient Visits          1 month ago Anxiety and depression   [facility] [place], Shea Stakes, NP   1 month ago Peripheral neuropathic pain   [place] [place], Shea Stakes, NP   4 months ago Type 2 diabetes mellitus with diabetic polyneuropathy, with long-term current use of insulin (HCC)   [place] [facility] [place], Shea Stakes, NP   5 months ago Chronic bilateral low back pain with left-sided sciatica   [facility] [place], Odette Horns, MD   6 months ago Type 2 diabetes mellitus with diabetic polyneuropathy, with long-term current use of insulin (HCC)   [facility] [place], Shea Stakes, NP      Future Appointments            In 3 weeks Claiborne Rigg, NP [place]            atorvastatin (LIPITOR) 20 MG tablet [Pharmacy Med Name: ATORVASTATIN 20 MG TABLET] 90 tablet 0    Sig: TAKE 1 TABLET BY MOUTH EVERY DAY     Cardiovascular:  Antilipid - Statins Failed - 01/15/2021 10:47 AM      Failed - Total Cholesterol in normal range and within 360 days    Cholesterol, Total   Date Value Ref Range Status  10/30/2019 144 100 - 199 mg/dL Final         Failed - LDL in normal range and within 360 days    LDL Chol Calc (NIH)  Date Value Ref Range Status  10/30/2019 58 0 - 99 mg/dL Final         Failed - HDL in normal range and within 360 days    HDL  Date Value Ref Range Status  10/30/2019 71 >39 mg/dL Final         Failed - Triglycerides in normal range and within 360 days    Triglycerides  Date Value Ref Range Status  10/30/2019 80 0 - 149 mg/dL Final         Passed - Patient is not pregnant      Passed - Valid encounter within last 12 months    Recent Outpatient Visits          1 month ago Anxiety and depression   [facility] [address] [facility]ness [place], [place],  NP   1 month ago Peripheral neuropathic pain   [place] [facility] [place], [place] W, NP   4 months ago Type 2 diabetes mellitus with diabetic polyneuropathy, with long-term current use of insulin [facility])   [place] [facility] Wellness [place], [place] W, NP   5 months ago Chronic bilateral low back pain with left-sided sciatica   [place] [facility] [place], Odette Horns, MD   6 months ago Type 2 diabetes mellitus with diabetic polyneuropathy, with long-term current use of insulin [facility])   [place] [facility] And Wellness Claiborne Rigg, NP      Future Appointments            In 3 weeks Claiborne Rigg, NP [employer] [facility]

## 2021-01-15 NOTE — Telephone Encounter (Signed)
Requested medication (s) are due for refill today - yes  Requested medication (s) are on the active medication list -yes  Future visit scheduled -yes  Last refill: 11/18/20 #60  Notes to clinic: Request RF: non delegated Rx  Requested Prescriptions  Pending Prescriptions Disp Refills   pregabalin (LYRICA) 50 MG capsule 60 capsule 0    Sig: Take 1 capsule (50 mg total) by mouth 2 (two) times daily.     Not Delegated - Neurology:  Anticonvulsants - Controlled Failed - 01/15/2021 11:06 AM      Failed - This refill cannot be delegated      Passed - Valid encounter within last 12 months    Recent Outpatient Visits           1 month ago Anxiety and depression   [facility] [place], Shea Stakes, NP   1 month ago Peripheral neuropathic pain   [facility] [place], [place] W, NP   4 months ago Type 2 diabetes mellitus with diabetic polyneuropathy, with long-term current use of insulin [facility])   [place] [facility] And Wellness [place], [place] W, NP   5 months ago Chronic bilateral low back pain with left-sided sciatica   [place] [facility]ness [place], Odette Horns, MD   6 months ago Type 2 diabetes mellitus with diabetic polyneuropathy, with long-term current use of insulin [facility])   [place] [facility] And Wellness [place], Shea Stakes, NP       Future Appointments             In 3 weeks Claiborne Rigg, NP [place] [facility]               Requested Prescriptions  Pending Prescriptions Disp Refills   pregabalin (LYRICA) 50 MG capsule 60 capsule 0    Sig: Take 1 capsule (50 mg total) by mouth 2 (two) times daily.     Not Delegated - Neurology:  Anticonvulsants - Controlled Failed - 01/15/2021 11:06 AM      Failed - This refill cannot be delegated      Passed - Valid encounter within last 12 months    Recent Outpatient Visits           1 month ago Anxiety and  depression   [place] [facility] [place], Shea Stakes, NP   1 month ago Peripheral neuropathic pain   [place] Wellness [place], [place] W, NP   4 months ago Type 2 diabetes mellitus with diabetic polyneuropathy, with long-term current use of insulin [facility])   [place] [facility] Wellness [place], [place] W, NP   5 months ago Chronic bilateral low back pain with left-sided sciatica   [place] [facility] Wellness [place], Odette Horns, MD   6 months ago Type 2 diabetes mellitus with diabetic polyneuropathy, with long-term current use of insulin [facility])   [place] [facility] And Wellness [place], Shea Stakes, NP       Future Appointments             In 3 weeks Claiborne Rigg, NP [facility] And [facility]ness

## 2021-01-15 NOTE — Telephone Encounter (Signed)
Requested medication (s) are due for refill today: see notes  Requested medication (s) are on the active medication list: yes  Last refill:  lyrica- 11/18/20-12/18/20 #60 0 refills, celexa-12/15/20-03/15/21 #90 0 refills  Future visit scheduled: yes  Notes to clinic:  not delegated per protocol . Celexa too soon      Requested Prescriptions  Pending Prescriptions Disp Refills   pregabalin (LYRICA) 50 MG capsule [Pharmacy Med Name: PREGABALIN 50 MG CAPSULE] 60 capsule 0    Sig: TAKE 1 CAPSULE BY MOUTH 2 TIMES DAILY.     Not Delegated - Neurology:  Anticonvulsants - Controlled Failed - 01/15/2021 10:47 AM      Failed - This refill cannot be delegated      Passed - Valid encounter within last 12 months    Recent Outpatient Visits           1 month ago Anxiety and depression   [place] [facility] [place], Shea Stakes, NP   1 month ago Peripheral neuropathic pain   [place] [facility] Wellness [place], [place] W, NP   4 months ago Type 2 diabetes mellitus with diabetic polyneuropathy, with long-term current use of insulin [facility])   [place] [facility] And Wellness [place], [place] W, NP   5 months ago Chronic bilateral low back pain with left-sided sciatica   [facility] [place], Odette Horns, MD   6 months ago Type 2 diabetes mellitus with diabetic polyneuropathy, with long-term current use of insulin [facility])   [place] [facility] And Wellness [place], Shea Stakes, NP       Future Appointments             In 3 weeks Claiborne Rigg, NP [place] [facility]             citalopram (CELEXA) 20 MG tablet [Pharmacy Med Name: CITALOPRAM HBR 20 MG TABLET] 90 tablet 0    Sig: TAKE 1 TABLET BY MOUTH EVERY DAY     Psychiatry:  Antidepressants - SSRI Passed - 01/15/2021 10:47 AM      Passed - Valid encounter within last 6 months    Recent Outpatient Visits           1 month ago Anxiety and  depression   [place] [facility] [place], Shea Stakes, NP   1 month ago Peripheral neuropathic pain   [place] [facility] Wellness [place], [place] W, NP   4 months ago Type 2 diabetes mellitus with diabetic polyneuropathy, with long-term current use of insulin [facility])   [place] [facility] And Wellness [place], [place] W, NP   5 months ago Chronic bilateral low back pain with left-sided sciatica   [place] [facility] Wellness [place], Odette Horns, MD   6 months ago Type 2 diabetes mellitus with diabetic polyneuropathy, with long-term current use of insulin [facility])   [place] [facility] And Wellness [place], Shea Stakes, NP       Future Appointments             In 3 weeks Claiborne Rigg, NP [facility] [employer] And Wellness

## 2021-01-16 MED ORDER — PREGABALIN 50 MG PO CAPS: 50.0000 mg | ORAL_CAPSULE | Freq: Two times a day (BID) | ORAL | 0 refills | Status: DC

## 2021-01-18 ENCOUNTER — Telehealth: Payer: Self-pay | Admitting: Nurse Practitioner

## 2021-01-18 ENCOUNTER — Ambulatory Visit: Payer: 59 | Admitting: Nurse Practitioner

## 2021-01-18 NOTE — Telephone Encounter (Signed)
Left message to return call to our office.  Called pharmacy and they said she changed insurance so now it needs a prior [place].

## 2021-01-18 NOTE — Telephone Encounter (Signed)
Prior auths should always go to [place] in pharmacy if they have been on the medication before.

## 2021-01-18 NOTE — Telephone Encounter (Signed)
Pt called back to follow up on this PA, please advise.

## 2021-01-18 NOTE — Telephone Encounter (Signed)
Copied from [place] [phone]. Topic: Quick Communication - Rx Refill/Question >> Jan 18, 2021 12:37 PM [name] wrote: Pt called to report that she needs her trulicity PA completed as soon as possible.

## 2021-01-19 NOTE — Telephone Encounter (Signed)
Pt called in to provide her new insurance information for PA completion. Pt says that she now has   Friday insurance  Member ID# [id number]  Pharmacy ID# [id number] Bin# [id number] Group: [id number] PCN: [id number] Phone# [phone]

## 2021-01-20 ENCOUNTER — Telehealth: Payer: Self-pay

## 2021-01-20 ENCOUNTER — Other Ambulatory Visit: Payer: Self-pay

## 2021-01-20 NOTE — Telephone Encounter (Signed)
Patient called via a third party and indicated that she was upset/crying because she was needing a PA done for her Trulicity.  A PA request was not received via the CoverMyMeds portal or email, so PA request was not relayed.  Advised patient that I have the information and I will work on it now.

## 2021-01-20 NOTE — Telephone Encounter (Signed)
PA submitted to third party, awaiting decision.

## 2021-01-20 NOTE — Telephone Encounter (Signed)
PA for Trulicity is approved until 01/20/2022.  Pharmacy and patient aware

## 2021-01-21 ENCOUNTER — Other Ambulatory Visit: Payer: Self-pay

## 2021-01-22 ENCOUNTER — Other Ambulatory Visit: Payer: Self-pay

## 2021-01-31 ENCOUNTER — Other Ambulatory Visit: Payer: Self-pay | Admitting: Nurse Practitioner

## 2021-01-31 DIAGNOSIS — F32A Depression, unspecified: Secondary | ICD-10-CM

## 2021-01-31 DIAGNOSIS — F419 Anxiety disorder, unspecified: Secondary | ICD-10-CM

## 2021-01-31 DIAGNOSIS — E1142 Type 2 diabetes mellitus with diabetic polyneuropathy: Secondary | ICD-10-CM

## 2021-02-01 NOTE — Telephone Encounter (Signed)
Requested Prescriptions  Pending Prescriptions Disp Refills   TRULICITY 1.5 MG/0.5ML SOPN [Pharmacy Med Name: TRULICITY 1.5 MG/0.5 ML PEN] 2 mL 0    Sig: INJECT 1.5 MG INTO THE SKIN ONCE A WEEK     Endocrinology:  Diabetes - GLP-1 Receptor Agonists Passed - 01/31/2021  1:46 PM      Passed - HBA1C is between 0 and 7.9 and within 180 days    HbA1c, POC (controlled diabetic range)  Date Value Ref Range Status  09/01/2020 6.6 0.0 - 7.0 % Final         Passed - Valid encounter within last 6 months    Recent Outpatient Visits          1 month ago Anxiety and depression   [place] [facility] [place], [place] W, NP   2 months ago Peripheral neuropathic pain   [place] [facility] [place], [place] W, NP   5 months ago Type 2 diabetes mellitus with diabetic polyneuropathy, with long-term current use of insulin [facility])   [place] [facility] Wellness [place], [place] W, NP   5 months ago Chronic bilateral low back pain with left-sided sciatica   [place] [facility] [place], [place], MD   7 months ago Type 2 diabetes mellitus with diabetic polyneuropathy, with long-term current use of insulin [facility])   [place] [facility] And Wellness [place], Shea Stakes, NP      Future Appointments            In 1 week Claiborne Rigg, NP [place] [facility]            gabapentin (NEURONTIN) 600 MG tablet [Pharmacy Med Name: GABAPENTIN 600 MG TABLET] 90 tablet 3    Sig: TAKE 1 TABLET BY MOUTH THREE TIMES A DAY     Neurology: Anticonvulsants - gabapentin Passed - 01/31/2021  1:46 PM      Passed - Valid encounter within last 12 months    Recent Outpatient Visits          1 month ago Anxiety and depression   [place] [facility] [place], [place] W, NP   2 months ago Peripheral neuropathic pain   [place] [facility] Wellness [place], [place] W, NP   5 months ago Type 2 diabetes  mellitus with diabetic polyneuropathy, with long-term current use of insulin [facility])   [place] [facility] And Wellness [place], [place] W, NP   5 months ago Chronic bilateral low back pain with left-sided sciatica   [place] [facility] And Wellness [place], [place], MD   7 months ago Type 2 diabetes mellitus with diabetic polyneuropathy, with long-term current use of insulin [facility])   [place] [facility] And Wellness [place], Shea Stakes, NP      Future Appointments            In 1 week Claiborne Rigg, NP [place] [facility]            citalopram (CELEXA) 20 MG tablet [Pharmacy Med Name: CITALOPRAM HBR 20 MG TABLET] 90 tablet 0    Sig: TAKE 1 TABLET BY MOUTH EVERY DAY     Psychiatry:  Antidepressants - SSRI Passed - 01/31/2021  1:46 PM      Passed - Valid encounter within last 6 months    Recent Outpatient Visits          1 month ago  Anxiety and depression   [place] [facility] And Wellness [place], [place] W, NP   2 months ago Peripheral neuropathic pain   [place] [facility] Wellness [place], [place] W, NP   5 months ago Type 2 diabetes mellitus with diabetic polyneuropathy, with long-term current use of insulin [facility])   [place] [facility] And Wellness [place], [place] W, NP   5 months ago Chronic bilateral low back pain with left-sided sciatica   [place] [facility] Wellness [place], Odette Horns, MD   7 months ago Type 2 diabetes mellitus with diabetic polyneuropathy, with long-term current use of insulin [facility])   [place] [facility] And Wellness Claiborne Rigg, NP      Future Appointments            In 1 week Claiborne Rigg, NP [employer] [facility]

## 2021-02-01 NOTE — Telephone Encounter (Signed)
Requested Prescriptions  Signed Prescriptions Disp Refills   TRULICITY 1.5 MG/0.5ML SOPN 2 mL 0    Sig: INJECT 1.5 MG INTO THE SKIN ONCE A WEEK     Endocrinology:  Diabetes - GLP-1 Receptor Agonists Passed - 01/31/2021  1:46 PM      Passed - HBA1C is between 0 and 7.9 and within 180 days    HbA1c, POC (controlled diabetic range)  Date Value Ref Range Status  09/01/2020 6.6 0.0 - 7.0 % Final         Passed - Valid encounter within last 6 months    Recent Outpatient Visits          1 month ago Anxiety and depression   [place] [place], [place] W, NP   2 months ago Peripheral neuropathic pain   [place] [facility] [place], [place] W, NP   5 months ago Type 2 diabetes mellitus with diabetic polyneuropathy, with long-term current use of insulin [facility])   [place] [facility] Wellness [place], [place] W, NP   5 months ago Chronic bilateral low back pain with left-sided sciatica   [place] [facility] [place], Odette Horns, MD   7 months ago Type 2 diabetes mellitus with diabetic polyneuropathy, with long-term current use of insulin [facility])   [place] [facility] And Wellness [place], Shea Stakes, NP      Future Appointments            In 1 week Claiborne Rigg, NP [place] [facility]           Refused Prescriptions Disp Refills   gabapentin (NEURONTIN) 600 MG tablet [Pharmacy Med Name: GABAPENTIN 600 MG TABLET] 90 tablet 3    Sig: TAKE 1 TABLET BY MOUTH THREE TIMES A DAY     Neurology: Anticonvulsants - gabapentin Passed - 01/31/2021  1:46 PM      Passed - Valid encounter within last 12 months    Recent Outpatient Visits          1 month ago Anxiety and depression   [place] [facility] [place], [place] W, NP   2 months ago Peripheral neuropathic pain   [facility] And Wellness [place], [place] W, NP   5 months ago Type 2 diabetes mellitus with  diabetic polyneuropathy, with long-term current use of insulin [facility])   [place] [facility] And Wellness [place], [place] W, NP   5 months ago Chronic bilateral low back pain with left-sided sciatica   [place] [facility] And Wellness [place], [place], MD   7 months ago Type 2 diabetes mellitus with diabetic polyneuropathy, with long-term current use of insulin [facility])   [place] [facility] Wellness [place], Shea Stakes, NP      Future Appointments            In 1 week Claiborne Rigg, NP [place] [facility]            citalopram (CELEXA) 20 MG tablet [Pharmacy Med Name: CITALOPRAM HBR 20 MG TABLET] 90 tablet 0    Sig: TAKE 1 TABLET BY MOUTH EVERY DAY     Psychiatry:  Antidepressants - SSRI Passed - 01/31/2021  1:46 PM      Passed - Valid encounter within last 6 months    Recent Outpatient Visits          1 month ago Anxiety and depression  [facility] Wellness [place], [place] W, NP   2 months ago Peripheral neuropathic pain   [facility] [place], [place] W, NP   5 months ago Type 2 diabetes mellitus with diabetic polyneuropathy, with long-term current use of insulin [place] [facility])   [place] [facility] And Wellness [place], [place] W, NP   5 months ago Chronic bilateral low back pain with left-sided sciatica   [place] [facility] And Wellness [place], Odette Horns, MD   7 months ago Type 2 diabetes mellitus with diabetic polyneuropathy, with long-term current use of insulin [facility])    [facility] And Wellness Claiborne Rigg, NP      Future Appointments            In 1 week Claiborne Rigg, NP [employer] [facility] Wellness

## 2021-02-10 ENCOUNTER — Ambulatory Visit: Payer: Self-pay | Attending: Nurse Practitioner | Admitting: Nurse Practitioner

## 2021-02-10 ENCOUNTER — Other Ambulatory Visit: Payer: Self-pay

## 2021-02-10 ENCOUNTER — Encounter: Payer: Self-pay | Admitting: Nurse Practitioner

## 2021-02-10 VITALS — BP 139/87 | HR 71 | Ht 66.0 in | Wt 139.0 lb

## 2021-02-10 DIAGNOSIS — F419 Anxiety disorder, unspecified: Secondary | ICD-10-CM | POA: Insufficient documentation

## 2021-02-10 DIAGNOSIS — K219 Gastro-esophageal reflux disease without esophagitis: Secondary | ICD-10-CM | POA: Insufficient documentation

## 2021-02-10 DIAGNOSIS — Z130 Encounter for screening for diseases of the blood and blood-forming organs and certain disorders involving the immune mechanism: Secondary | ICD-10-CM

## 2021-02-10 DIAGNOSIS — R110 Nausea: Secondary | ICD-10-CM

## 2021-02-10 DIAGNOSIS — Z794 Long term (current) use of insulin: Secondary | ICD-10-CM

## 2021-02-10 DIAGNOSIS — J45909 Unspecified asthma, uncomplicated: Secondary | ICD-10-CM | POA: Insufficient documentation

## 2021-02-10 DIAGNOSIS — E1142 Type 2 diabetes mellitus with diabetic polyneuropathy: Secondary | ICD-10-CM

## 2021-02-10 DIAGNOSIS — Z23 Encounter for immunization: Secondary | ICD-10-CM

## 2021-02-10 DIAGNOSIS — E785 Hyperlipidemia, unspecified: Secondary | ICD-10-CM

## 2021-02-10 DIAGNOSIS — Z1231 Encounter for screening mammogram for malignant neoplasm of breast: Secondary | ICD-10-CM

## 2021-02-10 DIAGNOSIS — Z1211 Encounter for screening for malignant neoplasm of colon: Secondary | ICD-10-CM

## 2021-02-10 DIAGNOSIS — F32A Depression, unspecified: Secondary | ICD-10-CM | POA: Insufficient documentation

## 2021-02-10 LAB — POCT GLYCOSYLATED HEMOGLOBIN (HGB A1C): Hemoglobin A1C: 6.6 % — AB (ref 4.0–5.6)

## 2021-02-10 LAB — GLUCOSE, POCT (MANUAL RESULT ENTRY): POC Glucose: 128 mg/dl — AB (ref 70–99)

## 2021-02-10 MED ORDER — ONDANSETRON 4 MG PO TBDP: 4.0000 mg | ORAL_TABLET | Freq: Three times a day (TID) | ORAL | 1 refills | Status: DC | PRN

## 2021-02-10 NOTE — Progress Notes (Signed)
Assessment & Plan:  Yari was seen today for diabetes.  Diagnoses and all orders for this visit:  Type 2 diabetes mellitus with diabetic polyneuropathy, with long-term current use of insulin (HCC) -     POCT glycosylated hemoglobin (Hb A1C) -     POCT glucose (manual entry) -     CMP14+EGFR  Colon cancer screening -     Fecal occult blood, imunochemical -     Cancel: Ambulatory referral to Gastroenterology  Need for shingles vaccine -     Varicella-zoster vaccine IM (Shingrix)  Chronic nausea -     ondansetron (ZOFRAN ODT) 4 MG disintegrating tablet; Take 1 tablet (4 mg total) by mouth every 8 (eight) hours as needed for nausea or vomiting.  Breast cancer screening by mammogram -     MS DIGITAL SCREENING TOMO BILATERAL; Future  Dyslipidemia, goal LDL below 70 -     Lipid panel  Screening for deficiency anemia -     CBC    Patient has been counseled on age-appropriate routine health concerns for screening and prevention. These are reviewed and up-to-date. Referrals have been placed accordingly. Immunizations are up-to-date or declined.    Subjective:   Chief Complaint  Patient presents with   Diabetes   HPI Denise Hale 51 y.o. female presents to office today for follow-up to diabetes. She has a past medical history of Anxiety, Depression, Asthma, DM2, peripheral neuropathy, Carpal tunnel syndrome (with release surgery) Diverticulitis, and GERD   DM 2 She has not been monitoring her blood glucose levels at home. Hasnt been checking her blood glucose levels.  Endorses adherence taking Tradjenta 5 mg daily, Trulicity 1.5 mg weekly and NovoLog SSI Lab Results  Component Value Date   HGBA1C 6.6 (A) 02/10/2021    Lab Results  Component Value Date   HGBA1C 6.6 09/01/2020    Neuropathy: She describes symptoms of numbness, burning, tingling, and sharp pain in both feet with left greater than right. Onset of symptoms was  several months ago . Symptoms are currently of  moderate severity. Symptoms occur all day and last hours. Previous treatment has included gabapentin 600 mg 3 times daily, which has not improved symptoms.  Application of heat does seem to help "calm" the intensity of pain  Review of Systems  Constitutional:  Negative for fever, malaise/fatigue and weight loss.  HENT: Negative.  Negative for nosebleeds.   Eyes: Negative.  Negative for blurred vision, double vision and photophobia.  Respiratory: Negative.  Negative for cough and shortness of breath.   Cardiovascular: Negative.  Negative for chest pain, palpitations and leg swelling.  Gastrointestinal:  Negative for heartburn and vomiting.  Musculoskeletal: Negative.  Negative for myalgias.  Neurological: Negative.  Negative for dizziness, focal weakness, seizures and headaches.  Psychiatric/Behavioral: Negative.  Negative for suicidal ideas.    Past Medical History:  Diagnosis Date   Anxiety    Asthma    Diabetes mellitus without complication ([place])    Diverticulitis    GERD (gastroesophageal reflux disease)     Past Surgical History:  Procedure Laterality Date   CARPAL TUNNEL RELEASE Right 04/08/2020   Procedure: RIGHT CARPAL TUNNEL RELEASE;  Surgeon: Leandrew Koyanagi, MD;  Location: [facility];  Service: Orthopedics;  Laterality: Right;   CHOLECYSTECTOMY     COLON SURGERY      Family History  Problem Relation Age of Onset   Diabetes Mother    Thyroid disease Mother    Heart disease Father  Breast cancer Paternal Aunt    Breast cancer Paternal Aunt     Social History Reviewed with no changes to be made today.   Outpatient Medications Prior to Visit  Medication Sig Dispense Refill   atorvastatin (LIPITOR) 20 MG tablet TAKE 1 TABLET BY MOUTH EVERY DAY 90 tablet 0   citalopram (CELEXA) 20 MG tablet Take 1 tablet (20 mg total) by mouth daily. 90 tablet 0   DULoxetine (CYMBALTA) 60 MG capsule TAKE 1 CAPSULE (60 MG TOTAL) BY MOUTH DAILY. FOR BACK PAIN 90 capsule 1    fluconazole (DIFLUCAN) 150 MG tablet Take 1 tablet (150 mg total) by mouth daily. 3 tablet 0   linaclotide (LINZESS) 72 MCG capsule Take 1 capsule (72 mcg total) by mouth daily before breakfast. 90 capsule 2   linagliptin (TRADJENTA) 5 MG TABS tablet Tradjenta 5 mg tablet  Take 1 tablet every day by oral route.     lisinopril (ZESTRIL) 5 MG tablet TAKE 1 TABLET (5 MG TOTAL) BY MOUTH DAILY. 90 tablet 0   mupirocin ointment (BACTROBAN) 2 % Apply 1 application topically 2 (two) times daily. 60 g 0   nystatin-triamcinolone ointment (MYCOLOG) APPLY THIN FILM TO EXTERNAL VAGINAL AREA DAILY X 5 DAYS. DO NOT INSERT IN VAGINA. 30 g 0   pantoprazole (PROTONIX) 40 MG tablet pantoprazole 40 mg tablet,delayed release  Take 1 tablet every day by oral route.     pregabalin (LYRICA) 50 MG capsule Take 1 capsule (50 mg total) by mouth 2 (two) times daily. 60 capsule 0   tiZANidine (ZANAFLEX) 4 MG tablet TAKE 1 TABLET BY MOUTH EVERY 6 HOURS AS NEEDED FOR MUSCLE SPASMS. 60 tablet 1   traMADol (ULTRAM) 50 MG tablet Take 1 tablet (50 mg total) by mouth 3 (three) times daily as needed. 30 tablet 2   TRULICITY 1.5 OO/8.7NZ SOPN INJECT 1.5 MG INTO THE SKIN ONCE A WEEK 2 mL 0   ondansetron (ZOFRAN ODT) 4 MG disintegrating tablet Take 1 tablet (4 mg total) by mouth every 8 (eight) hours as needed for nausea or vomiting. 40 tablet 1   Blood Glucose Monitoring Suppl (ONETOUCH VERIO REFLECT) w/Device KIT Check blood glucose level by fingerstick twice per day. Dx: E11.65 (Patient not taking: Reported on 02/10/2021) 1 kit 0   glucose blood (ONETOUCH VERIO) test strip Check blood glucose level by fingerstick twice per day. Dx: E11.65 (Patient not taking: Reported on 02/10/2021) 100 each 2   insulin aspart (NOVOLOG) 100 UNIT/ML FlexPen FOR BLOOD SUGARS 0-150 GIVE 0 UNITS OF INSULIN, 151-200 GIVE 2 UNITS OF INSULIN, 201-250 GIVE 4 UNITS, 251-300 GIVE 6 UNITS, 301-350 GIVE 8 UNITS, 15 mL 11   insulin lispro (HUMALOG) 100 UNIT/ML  KwikPen Humalog KwikPen (U-100) Insulin 100 unit/mL subcutaneous  Inject by subcutaneous route. (Patient not taking: Reported on 02/10/2021)     Insulin Pen Needle (B-D UF III MINI PEN NEEDLES) 31G X 5 MM MISC Use as instructed. Inject into the skin four times daily. E11.65 (Patient not taking: Reported on 02/10/2021) 200 each 6   Lancets (ONETOUCH DELICA PLUS VJKQAS60R) MISC Check blood glucose level by fingerstick twice per day. Dx: E11.65 (Patient not taking: Reported on 02/10/2021) 100 each 2   TRUEplus Lancets 28G MISC Use as instructed. Check blood glucose level by fingerstick twice per day.   E11.65 (Patient not taking: Reported on 02/10/2021) 100 each 3   No facility-administered medications prior to visit.    Allergies  Allergen Reactions   Ciprofloxacin  Metformin And Related Diarrhea       Objective:    BP 139/87    Pulse 71    Ht '5\' 6"'  (1.676 m)    Wt 139 lb (63 kg)    SpO2 100%    BMI 22.44 kg/m  Wt Readings from Last 3 Encounters:  02/10/21 139 lb (63 kg)  11/18/20 145 lb 6 oz (65.9 kg)  09/01/20 141 lb 4 oz (64.1 kg)    Physical Exam Vitals and nursing note reviewed.  Constitutional:      Appearance: She is well-developed.  HENT:     Head: Normocephalic and atraumatic.  Cardiovascular:     Rate and Rhythm: Normal rate and regular rhythm.     Heart sounds: Normal heart sounds. No murmur heard.   No friction rub. No gallop.  Pulmonary:     Effort: Pulmonary effort is normal. No tachypnea or respiratory distress.     Breath sounds: Normal breath sounds. No decreased breath sounds, wheezing, rhonchi or rales.  Chest:     Chest wall: No tenderness.  Abdominal:     General: Bowel sounds are normal.     Palpations: Abdomen is soft.  Musculoskeletal:        General: Normal range of motion.     Cervical back: Normal range of motion.  Skin:    General: Skin is warm and dry.  Neurological:     Mental Status: She is alert and oriented to person, place, and time.      Coordination: Coordination normal.  Psychiatric:        Behavior: Behavior normal. Behavior is cooperative.        Thought Content: Thought content normal.        Judgment: Judgment normal.         Patient has been counseled extensively about nutrition and exercise as well as the importance of adherence with medications and regular follow-up. The patient was given clear instructions to go to ER or return to medical center if symptoms don't improve, worsen or new problems develop. The patient verbalized understanding.   Follow-up: Return in about 3 months (around 05/10/2021).   Gildardo Pounds, FNP-BC [facility] [place], [place]   02/10/2021, 12:59 PM

## 2021-02-11 LAB — LIPID PANEL

## 2021-02-12 LAB — CBC
Hematocrit: 38.7 % (ref 34.0–46.6)
Hemoglobin: 12.8 g/dL (ref 11.1–15.9)
MCH: 29.6 pg (ref 26.6–33.0)
MCHC: 33.1 g/dL (ref 31.5–35.7)
MCV: 90 fL (ref 79–97)
Platelets: 354 10*3/uL (ref 150–450)
RBC: 4.32 x10E6/uL (ref 3.77–5.28)
RDW: 12.9 % (ref 11.7–15.4)
WBC: 5.2 10*3/uL (ref 3.4–10.8)

## 2021-02-12 LAB — CMP14+EGFR
ALT: 27 IU/L (ref 0–32)
AST: 21 IU/L (ref 0–40)
Albumin/Globulin Ratio: 1.3 (ref 1.2–2.2)
Albumin: 4.3 g/dL (ref 3.8–4.9)
Alkaline Phosphatase: 121 IU/L (ref 44–121)
BUN/Creatinine Ratio: 10 (ref 9–23)
BUN: 6 mg/dL (ref 6–24)
Bilirubin Total: 0.2 mg/dL (ref 0.0–1.2)
CO2: 24 mmol/L (ref 20–29)
Calcium: 9.4 mg/dL (ref 8.7–10.2)
Chloride: 101 mmol/L (ref 96–106)
Creatinine, Ser: 0.61 mg/dL (ref 0.57–1.00)
Globulin, Total: 3.3 g/dL (ref 1.5–4.5)
Glucose: 109 mg/dL — ABNORMAL HIGH (ref 70–99)
Potassium: 3.9 mmol/L (ref 3.5–5.2)
Sodium: 140 mmol/L (ref 134–144)
Total Protein: 7.6 g/dL (ref 6.0–8.5)
eGFR: 108 mL/min/{1.73_m2} (ref >59)

## 2021-02-12 LAB — LIPID PANEL
Chol/HDL Ratio: 2.1 ratio (ref 0.0–4.4)
Cholesterol, Total: 134 mg/dL (ref 100–199)
HDL: 65 mg/dL (ref >39)
LDL Chol Calc (NIH): 57 mg/dL (ref 0–99)
Triglycerides: 54 mg/dL (ref 0–149)
VLDL Cholesterol Cal: 12 mg/dL (ref 5–40)

## 2021-02-18 ENCOUNTER — Other Ambulatory Visit: Payer: Self-pay | Admitting: Nurse Practitioner

## 2021-02-18 DIAGNOSIS — M792 Neuralgia and neuritis, unspecified: Secondary | ICD-10-CM

## 2021-02-18 NOTE — Telephone Encounter (Signed)
Requested medication (s) are due for refill today: yes  Requested medication (s) are on the active medication list: yes  Last refill:  01/16/21 #60 with 0 RF  Future visit scheduled: 05/12/21  Notes to clinic:  This medication can not be delegated, please assess.        Requested Prescriptions  Pending Prescriptions Disp Refills   pregabalin (LYRICA) 50 MG capsule [Pharmacy Med Name: PREGABALIN 50 MG CAPSULE] 60 capsule 0    Sig: TAKE 1 CAPSULE BY MOUTH 2 TIMES DAILY.     Not Delegated - Neurology:  Anticonvulsants - Controlled - pregabalin Failed - 02/18/2021 11:25 AM      Failed - This refill cannot be delegated      Passed - Cr in normal range and within 360 days    Creatinine, Ser  Date Value Ref Range Status  02/10/2021 0.61 0.57 - 1.00 mg/dL Final          Passed - Completed PHQ-2 or PHQ-9 in the last 360 days      Passed - Valid encounter within last 12 months    Recent Outpatient Visits           1 week ago Type 2 diabetes mellitus with diabetic polyneuropathy, with long-term current use of insulin ([place])   [place], Vernia Buff, NP   2 months ago Anxiety and depression   [place], [place] W, NP   3 months ago Peripheral neuropathic pain   [place], [place] W, NP   5 months ago Type 2 diabetes mellitus with diabetic polyneuropathy, with long-term current use of insulin [facility])   [place], [place] W, NP   6 months ago Chronic bilateral low back pain with left-sided sciatica   Marvin, Enobong, MD       Future Appointments             In 2 months Denise Pounds, NP [place]

## 2021-02-25 ENCOUNTER — Ambulatory Visit: Payer: Self-pay

## 2021-02-25 NOTE — Telephone Encounter (Signed)
°  Chief Complaint: suspected COVID Symptoms: chills, body aches, fever, N/V/D, sinus congestion Frequency: 1 week + Pertinent Negatives: Patient denies SOB or cough Disposition: [] ED /[] Urgent Care (no appt availability in office) / [] Appointment(In office/virtual)/ []  [place] [facility]/ [x] Home Care/ [] Refused Recommended Disposition /[] [place] [facility]/ []  Follow-up with PCP Additional Notes: advised pt to take COVID test and NT would call back with results. Pt states she is at work and would take test tonight and call back in the morning with results.    Reason for Disposition  [1] COVID-19 infection suspected by caller or triager AND [2] mild symptoms (cough, fever, or others) AND [3] has not gotten tested yet  Answer Assessment - Initial Assessment Questions 1. COVID-19 DIAGNOSIS: "Who made your COVID-19 diagnosis?" "Was it confirmed by a positive lab test or self-test?" If not diagnosed by a doctor (or NP/PA), ask "Are there lots of cases (community spread) where you live?" Note: See public health department website, if unsure.     Hadn't taken a test 3. ONSET: "When did the COVID-19 symptoms start?"      Symptoms have been going on longer than 1 week 5. COUGH: "Do you have a cough?" If Yes, ask: "How bad is the cough?"       No 6. FEVER: "Do you have a fever?" If Yes, ask: "What is your temperature, how was it measured, and when did it start?"     yes 7. RESPIRATORY STATUS: "Describe your breathing?" (e.g., shortness of breath, wheezing, unable to speak)      No 8. BETTER-SAME-WORSE: "Are you getting better, staying the same or getting worse compared to yesterday?"  If getting worse, ask, "In what way?"     worse 9. HIGH RISK DISEASE: "Do you have any chronic medical problems?" (e.g., asthma, heart or lung disease, weak immune system, obesity, etc.)     No 13. OTHER SYMPTOMS: "Do you have any other symptoms?"  (e.g., chills, fatigue, headache, loss of smell or taste,  muscle pain, sore throat)       Chills, N/V/D, body aches, sinus congestion,  Protocols used: Coronavirus (COVID-19) Diagnosed or Suspected-A-AH

## 2021-02-25 NOTE — Telephone Encounter (Signed)
Patient called, left VM to return the call to the office to discuss symptoms with a nurse.  Summary: Vomiting   Patient unsure if she has a fever but she has been experiencing vomiting, diarrhea, chills, body aches for t he last coupe of days

## 2021-02-26 ENCOUNTER — Telehealth: Payer: Self-pay

## 2021-02-26 NOTE — Telephone Encounter (Signed)
Left message to return call to our office.  

## 2021-03-09 ENCOUNTER — Other Ambulatory Visit: Payer: Self-pay | Admitting: Nurse Practitioner

## 2021-03-09 DIAGNOSIS — E1142 Type 2 diabetes mellitus with diabetic polyneuropathy: Secondary | ICD-10-CM

## 2021-03-10 NOTE — Telephone Encounter (Signed)
Requested Prescriptions  ?Pending Prescriptions Disp Refills  ?? TRULICITY 1.5 MG/0.5ML SOPN [Pharmacy Med Name: TRULICITY 1.5 MG/0.5 ML PEN] 2 mL 1  ?  Sig: INJECT 1.5 MG INTO THE SKIN ONCE A WEEK  ?  ? Endocrinology:  Diabetes - GLP-1 Receptor Agonists Passed - 03/09/2021 11:03 AM  ?  ?  Passed - HBA1C is between 0 and 7.9 and within 180 days  ?  Hemoglobin A1C  ?Date Value Ref Range Status  ?02/10/2021 6.6 (A) 4.0 - 5.6 % Final  ? ?HbA1c, POC (controlled diabetic range)  ?Date Value Ref Range Status  ?09/01/2020 6.6 0.0 - 7.0 % Final  ?   ?  ?  Passed - Valid encounter within last 6 months  ?  Recent Outpatient Visits   ?      ? 4 weeks ago Type 2 diabetes mellitus with diabetic polyneuropathy, with long-term current use of insulin (HCC)  ? [facility] [place], [place] W, NP  ? 2 months ago Anxiety and depression  ? [facility] [place], [place] W, NP  ? 3 months ago Peripheral neuropathic pain  ? [facility] [place], [place] W, NP  ? 6 months ago Type 2 diabetes mellitus with diabetic polyneuropathy, with long-term current use of insulin (HCC)  ? [facility] [place], [place] W, NP  ? 6 months ago Chronic bilateral low back pain with left-sided sciatica  ? [facility] H[place] Register, MD  ?  ?  ?Future Appointments   ?        ? In 2 months Claiborne Rigg, NP [facility]  ?  ? ?  ?  ?  ? ?

## 2021-04-05 ENCOUNTER — Ambulatory Visit: Payer: Self-pay | Admitting: *Deleted

## 2021-04-05 NOTE — Telephone Encounter (Signed)
?  Chief Complaint: infected tooth ?Symptoms: pain ?Frequency: constant ?Pertinent Negatives: Patient denies dental care ?Disposition: [x] ED /[] Urgent Care (no appt availability in office) / [] Appointment(In office/virtual)/ []  [place] [facility]/ [] Home Care/ [] Refused Recommended Disposition /[] [place] [facility]/ []  Follow-up with PCP ?Additional Notes: Pt crying, states tooth broken off and now is infected. No dentist, Pt going to . Address given. ? ?Reason for Disposition ? [1] Drooling or spitting out saliva (because can't swallow) AND [2] new-onset ? ?Answer Assessment - Initial Assessment Questions ?1. ONSET: "When did the mouth start hurting?" (e.g., hours or days ago)  ?    A while, now one broken ?2. SEVERITY: "How bad is the pain?" (Scale 1-10; mild, moderate or severe) ?  - MILD (1-3):  doesn't interfere with eating or normal activities ?  - MODERATE (4-7): interferes with eating some solids and normal activities ?  - SEVERE (8-10):  excruciating pain, interferes with most normal activities ?  - SEVERE DYSPHAGIA: can't swallow liquids, drooling ?    10 ?3. SORES: "Are there any sores or ulcers in the mouth?" If Yes, ask: "What part of the mouth are the sores in?" ?    No, tooth infected per pt ?4. FEVER: "Do you have a fever?" If Yes, ask: "What is your temperature, how was it measured, and when did it start?" ?    Thinks so ?5. CAUSE: "What do you think is causing the mouth pain?" ?   Tooth broken and infected  ?6. OTHER SYMPTOMS: "Do you have any other symptoms?" (e.g., difficulty breathing) ?    No ? ?Protocols used: Mouth Pain-A-AH ? ?

## 2021-04-12 ENCOUNTER — Other Ambulatory Visit: Payer: Self-pay | Admitting: Nurse Practitioner

## 2021-04-12 DIAGNOSIS — M792 Neuralgia and neuritis, unspecified: Secondary | ICD-10-CM

## 2021-04-13 NOTE — Telephone Encounter (Signed)
Requested medication (s) are due for refill today: yes ? ?Requested medication (s) are on the active medication list: yes ? ?Last refill:  02/18/21 ? ?Future visit scheduled: no ? ?Notes to clinic:  Unable to refill per protocol, cannot delegate. ? ? ? ?  ?Requested Prescriptions  ?Pending Prescriptions Disp Refills  ? pregabalin (LYRICA) 50 MG capsule [Pharmacy Med Name: PREGABALIN 50 MG CAPSULE] 60 capsule 0  ?  Sig: TAKE 1 CAPSULE BY MOUTH TWICE A DAY  ?  ? Not Delegated - Neurology:  Anticonvulsants - Controlled - pregabalin Failed - 04/12/2021  5:47 PM  ?  ?  Failed - This refill cannot be delegated  ?  ?  Passed - Cr in normal range and within 360 days  ?  Creatinine, Ser  ?Date Value Ref Range Status  ?02/10/2021 0.61 0.57 - 1.00 mg/dL Final  ?  ?  ?  ?  Passed - Completed PHQ-2 or PHQ-9 in the last 360 days  ?  ?  Passed - Valid encounter within last 12 months  ?  Recent Outpatient Visits   ? ?      ? 2 months ago Type 2 diabetes mellitus with diabetic polyneuropathy, with long-term current use of insulin (HCC)  ? [facility] [place], [place] W, NP  ? 3 months ago Anxiety and depression  ? [facility] [place], [place] W, NP  ? 4 months ago Peripheral neuropathic pain  ? [facility] [place], [place] W, NP  ? 7 months ago Type 2 diabetes mellitus with diabetic polyneuropathy, with long-term current use of insulin (HCC)  ? [facility] [place], [place] W, NP  ? 7 months ago Chronic bilateral low back pain with left-sided sciatica  ? [facility] [place] [facility] Hoy Register, MD  ? ?  ?  ?Future Appointments   ? ?        ? In 4 weeks Claiborne Rigg, NP [facility]  ? ?  ? ?  ?  ?  ? ? ?

## 2021-04-19 ENCOUNTER — Ambulatory Visit: Admit: 2021-04-19 | Disposition: A | Discharge status: Other Acute Inpatient Hospital | Payer: Medicaid Other

## 2021-04-20 ENCOUNTER — Ambulatory Visit: Payer: Medicaid Other

## 2021-04-20 ENCOUNTER — Ambulatory Visit
Admission: EM | Admit: 2021-04-20 | Discharge: 2021-04-20 | Disposition: A | Discharge status: Home / Self Care | Payer: 59 | Attending: Emergency Medicine | Admitting: Emergency Medicine

## 2021-04-20 ENCOUNTER — Telehealth: Payer: Self-pay | Admitting: Emergency Medicine

## 2021-04-20 VITALS — BP 162/94 | HR 78 | Temp 98.5°F | Resp 18

## 2021-04-20 DIAGNOSIS — S025XXA Fracture of tooth (traumatic), initial encounter for closed fracture: Secondary | ICD-10-CM | POA: Diagnosis not present

## 2021-04-20 DIAGNOSIS — K219 Gastro-esophageal reflux disease without esophagitis: Secondary | ICD-10-CM | POA: Diagnosis not present

## 2021-04-20 DIAGNOSIS — K047 Periapical abscess without sinus: Secondary | ICD-10-CM | POA: Diagnosis not present

## 2021-04-20 DIAGNOSIS — R110 Nausea: Secondary | ICD-10-CM

## 2021-04-20 DIAGNOSIS — K029 Dental caries, unspecified: Secondary | ICD-10-CM | POA: Diagnosis not present

## 2021-04-20 LAB — POCT URINALYSIS DIP (MANUAL ENTRY)
Bilirubin, UA: NEGATIVE
Blood, UA: NEGATIVE
Glucose, UA: NEGATIVE mg/dL
Ketones, POC UA: NEGATIVE mg/dL
Leukocytes, UA: NEGATIVE
Nitrite, UA: NEGATIVE
Protein Ur, POC: 30 mg/dL — AB
Spec Grav, UA: >=1.03 — AB (ref 1.010–1.025)
Urobilinogen, UA: 0.2 E.U./dL
pH, UA: 5.5 (ref 5.0–8.0)

## 2021-04-20 MED ORDER — PENICILLIN V POTASSIUM 500 MG PO TABS: 500.0000 mg | ORAL_TABLET | Freq: Three times a day (TID) | ORAL | 0 refills | Status: AC

## 2021-04-20 MED ORDER — LANSOPRAZOLE 30 MG PO CPDR: 30.0000 mg | DELAYED_RELEASE_CAPSULE | Freq: Every day | ORAL | 1 refills | Status: DC

## 2021-04-20 MED ORDER — PENICILLIN G BENZATHINE 1200000 UNIT/2ML IM SUSY
1.2000 10*6.[IU] | PREFILLED_SYRINGE | Freq: Once | INTRAMUSCULAR | Status: AC
  Administered 2021-04-20: 1.2 10*6.[IU] via INTRAMUSCULAR

## 2021-04-20 MED ORDER — IBUPROFEN 800 MG PO TABS: 800.0000 mg | ORAL_TABLET | Freq: Three times a day (TID) | ORAL | 0 refills | Status: DC | PRN

## 2021-04-20 MED ORDER — ONDANSETRON 4 MG PO TBDP: 4.0000 mg | ORAL_TABLET | Freq: Three times a day (TID) | ORAL | 0 refills | Status: DC | PRN

## 2021-04-20 NOTE — Discharge Instructions (Addendum)
To rapidly address the infection in your teeth, you received an injection of penicillin during your visit today.  Please follow this with a 7-day course of penicillin 500 mg tablets, 1 tablet 3 times daily for the next 7 days.  Please finish all as prescribed and try not to miss any doses for best results. ? ?I provided you with the contact information for a dentist in [place], [place] with a clinic that provides medical and dental care based on a sliding scale fee.  The 2 dentists, Dr. Smitty Cords and Dr. Caprice Kluver, are very caring and very good dentists.  Please give them a call to see what fee you qualify for based on your income and to find out what their availability is.  It is important that you have your teeth addressed to avoid recurrent infections.  Dental infections can be dangerous your health because they can cause heart problems.  Please take care of your teeth as soon as you can. ? ?Your urinalysis today looked great, you are spilling a little bit of protein but nothing to be concerned about.  There was no sugar and no ketones in your urine.  This is good news means that your sugars are not totally out of whack.  Glad you have an appointment with your primary care provider.  If you ask her about your weight and she brushed you off ask her again, be persistent.  Consider talking to her about possibly getting you on a daily maintenance dose of long-acting insulin as opposed to using the short acting, sliding scale insulin which is more complicated and requires more frequent injections and fingersticks. ? ?I noticed that you have a prescription for heartburn medication in your chart that it does not appear you are still taking.  I took the liberty of sending a new prescription for another heartburn medication called Prevacid to your pharmacy.  Prevacid tends to work better than Pantoprazole, which is what you were taking before.  I believe not having your stomach acid well controlled may be contributing to your  nausea and upset stomach.  I recommend that you take Prevacid daily to prevent heartburn symptoms for the next 3 months.  Rest assured that Prevacid does not interact with any of the medicines that you are taking, does not have any unwanted side effects and is safe to use long-term.  Please take 1 capsule daily, every day.   ? ?Thank you for visiting urgent care today.  Please let us know if there is anything else we can do for you. ? ? ? ? ? ?

## 2021-04-20 NOTE — Telephone Encounter (Signed)
Patient requesting ibuprofen for pain

## 2021-04-20 NOTE — ED Provider Notes (Signed)
?[facility] ? ? ? ?CSN: [id number] ?Arrival date & time: 04/20/21  0254 ?  ? ?HISTORY  ? ?Chief Complaint  ?Patient presents with  ? Dental Pain  ? ?HPI ?Denise Hale is a 51 y.o. female. Pt presents with dental pain on left lower side for over a week , pt is tearful and worried about not having money to get dental care.  Type II diabetic, urinating more frequently, blood pressure elevated.  Patient is concerned that she continues to lose weight without trying.  Patient states she only eats 1 meal a day.  Patient states she is taking Trulicity but is not taking insulin because her primary care provider has discontinued it.  Patient states her stomach has been "tore up", not currently taking any medication for heartburn but has been taking Zofran which she has now run out of. ? ?The history is provided by the patient.  ?Past Medical History:  ?Diagnosis Date  ? Anxiety   ? Asthma   ? Diabetes mellitus without complication ([place])   ? Diverticulitis   ? GERD (gastroesophageal reflux disease)   ? ?Patient Active Problem List  ? Diagnosis Date Noted  ? 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 Nuclear sclerotic cataract of both eyes 11/04/2020  ? Carpal tunnel syndrome on right 04/07/2020  ? DM2 (diabetes mellitus, type 2) ([place]) 10/30/2019  ? Influenza vaccine needed 10/30/2019  ? ?Past Surgical History:  ?Procedure Laterality Date  ? CARPAL TUNNEL RELEASE Right 04/08/2020  ? Procedure: RIGHT CARPAL TUNNEL RELEASE;  Surgeon: Leandrew Koyanagi, MD;  Location: [place];  Service: Orthopedics;  Laterality: Right;  ? CHOLECYSTECTOMY    ? COLON SURGERY    ? ?OB History   ?No obstetric history on file. ?  ? ?Home Medications   ? ?Prior to Admission medications   ?Medication Sig Start Date End Date Taking? Authorizing Provider  ?pregabalin (LYRICA) 50 MG  capsule TAKE 1 CAPSULE BY MOUTH TWICE A DAY 04/14/21   Gildardo Pounds, NP  ?atorvastatin (LIPITOR) 20 MG tablet TAKE 1 TABLET BY MOUTH EVERY DAY 01/15/21   Argentina Donovan, PA-C  ?Blood Glucose Monitoring Suppl (ONETOUCH VERIO REFLECT) w/Device KIT Check blood glucose level by fingerstick twice per day. Dx: E11.65 ?Patient not taking: Reported on 02/10/2021 11/18/19   Charlott Rakes, MD  ?citalopram (CELEXA) 20 MG tablet Take 1 tablet (20 mg total) by mouth daily. 12/15/20 03/15/21  Gildardo Pounds, NP  ?DULoxetine (CYMBALTA) 60 MG capsule TAKE 1 CAPSULE (60 MG TOTAL) BY MOUTH DAILY. FOR BACK PAIN 09/10/20   Charlott Rakes, MD  ?fluconazole (DIFLUCAN) 150 MG tablet Take 1 tablet (150 mg total) by mouth daily. 05/28/20   Ladell Pier, MD  ?glucose blood (ONETOUCH VERIO) test strip Check blood glucose level by fingerstick twice per day. Dx: E11.65 ?Patient not taking: Reported on 02/10/2021 11/18/19   Charlott Rakes, MD  ?insulin aspart (NOVOLOG) 100 UNIT/ML FlexPen FOR BLOOD SUGARS 0-150 GIVE 0 UNITS OF INSULIN, 151-200 GIVE 2 UNITS OF INSULIN, 201-250 GIVE 4 UNITS, 251-300 GIVE 6 UNITS, 301-350 GIVE 8 UNITS, 07/02/19 07/01/20  Gildardo Pounds, NP  ?insulin lispro (HUMALOG) 100 UNIT/ML KwikPen Humalog KwikPen (U-100) Insulin 100 unit/mL subcutaneous ? Inject by subcutaneous route. ?Patient not taking: Reported on 02/10/2021    [provider]  ?Insulin Pen Needle (B-D UF III MINI  PEN NEEDLES) 31G X 5 MM MISC Use as instructed. Inject into the skin four times daily. E11.65 ?Patient not taking: Reported on 02/10/2021 04/15/19   Gildardo Pounds, NP  ?Lancets (ONETOUCH DELICA PLUS [id number]) MISC Check blood glucose level by fingerstick twice per day. Dx: E11.65 ?Patient not taking: Reported on 02/10/2021 11/18/19   Charlott Rakes, MD  ?linaclotide Rolan Lipa) 72 MCG capsule Take 1 capsule (72 mcg total) by mouth daily before breakfast. 02/27/20   Argentina Donovan, PA-C  ?linagliptin (TRADJENTA) 5 MG TABS tablet  Tradjenta 5 mg tablet ? Take 1 tablet every day by oral route.    [provider]  ?lisinopril (ZESTRIL) 5 MG tablet TAKE 1 TABLET (5 MG TOTAL) BY MOUTH DAILY. 01/15/21   Argentina Donovan, PA-C  ?mupirocin ointment (BACTROBAN) 2 % Apply 1 application topically 2 (two) times daily. 05/13/20   Gildardo Pounds, NP  ?nystatin-triamcinolone ointment (MYCOLOG) APPLY THIN FILM TO EXTERNAL VAGINAL AREA DAILY X 5 DAYS. DO NOT INSERT IN VAGINA. 12/22/20   Kerin Perna, NP  ?ondansetron (ZOFRAN ODT) 4 MG disintegrating tablet Take 1 tablet (4 mg total) by mouth every 8 (eight) hours as needed for nausea or vomiting. 02/10/21   Gildardo Pounds, NP  ?pantoprazole (PROTONIX) 40 MG tablet pantoprazole 40 mg tablet,delayed release ? Take 1 tablet every day by oral route.    [provider]  ?tiZANidine (ZANAFLEX) 4 MG tablet TAKE 1 TABLET BY MOUTH EVERY 6 HOURS AS NEEDED FOR MUSCLE SPASMS. 09/14/20   Charlott Rakes, MD  ?traMADol (ULTRAM) 50 MG tablet Take 1 tablet (50 mg total) by mouth 3 (three) times daily as needed. 12/21/20   Aundra Dubin, PA-C  ?TRUEplus Lancets 28G MISC Use as instructed. Check blood glucose level by fingerstick twice per day.   E11.65 ?Patient not taking: Reported on 02/10/2021 04/01/19   Gildardo Pounds, NP  ?TRULICITY 1.5 ZW/2.5EN SOPN INJECT 1.5 MG INTO THE SKIN ONCE A WEEK 03/10/21   Gildardo Pounds, NP  ? ?Family History ?Family History  ?Problem Relation Age of Onset  ? Diabetes Mother   ? Thyroid disease Mother   ? Heart disease Father   ? Breast cancer Paternal Aunt   ? Breast cancer Paternal Aunt   ? ?Social History ?Social History  ? ?Tobacco Use  ? Smoking status: Former  ? Smokeless tobacco: Never  ?Vaping Use  ? Vaping Use: Never used  ?Substance Use Topics  ? Alcohol use: Not Currently  ?  Comment: occasionally  ? Drug use: Yes  ?  Types: Marijuana  ? ?Allergies   ?Ciprofloxacin and Metformin and related ? ?Review of Systems ?Review of Systems ?Pertinent findings noted  in history of present illness.  ? ?Physical Exam ?Triage Vital Signs ?ED Triage Vitals  ?Enc Vitals Group  ?   BP 10/30/20 0827 (!) 147/82  ?   Pulse Rate 10/30/20 0827 72  ?   Resp 10/30/20 0827 18  ?   Temp 10/30/20 0827 98.3 ?F (36.8 ?C)  ?   Temp Source 10/30/20 0827 Oral  ?   SpO2 10/30/20 0827 98 %  ?   Weight --   ?   Height --   ?   Head Circumference --   ?   Peak Flow --   ?   Pain Score 10/30/20 0826 5  ?   Pain Loc --   ?   Pain Edu? --   ?   Excl. in  GC? --   ?No data found. ? ?Updated Vital Signs ?BP (!) 162/94   Pulse 78   Temp 98.5 ?F (36.9 ?C)   Resp 18   SpO2 98%  ? ?Physical Exam ?Vitals and nursing note reviewed.  ?Constitutional:   ?   General: She is not in acute distress. ?   Appearance: Normal appearance. She is not ill-appearing.  ?HENT:  ?   Head: Normocephalic and atraumatic.  ?   Mouth/Throat:  ?   Dentition: Abnormal dentition. Dental tenderness, gingival swelling, dental caries and dental abscesses present.  ?Eyes:  ?   General: Lids are normal.     ?   Right eye: No discharge.     ?   Left eye: No discharge.  ?   Extraocular Movements: Extraocular movements intact.  ?   Conjunctiva/sclera: Conjunctivae normal.  ?   Right eye: Right conjunctiva is not injected.  ?   Left eye: Left conjunctiva is not injected.  ?Neck:  ?   Trachea: Trachea and phonation normal.  ?Cardiovascular:  ?   Rate and Rhythm: Normal rate and regular rhythm.  ?   Pulses: Normal pulses.  ?   Heart sounds: Normal heart sounds. No murmur heard. ?  No friction rub. No gallop.  ?Pulmonary:  ?   Effort: Pulmonary effort is normal. No accessory muscle usage, prolonged expiration or respiratory distress.  ?   Breath sounds: Normal breath sounds. No stridor, decreased air movement or transmitted upper airway sounds. No decreased breath sounds, wheezing, rhonchi or rales.  ?Chest:  ?   Chest wall: No tenderness.  ?Musculoskeletal:     ?   General: Normal range of motion.  ?   Cervical back: Normal range of motion and  neck supple. Normal range of motion.  ?Lymphadenopathy:  ?   Cervical: No cervical adenopathy.  ?Skin: ?   General: Skin is warm and dry.  ?   Findings: No erythema or rash.  ?Neurological:  ?   General: No foca

## 2021-04-20 NOTE — ED Triage Notes (Signed)
Pt presents with dental pain on left lower side for over a week , pt is tearful and worried about not having money to get dental care  ?

## 2021-05-04 ENCOUNTER — Encounter (INDEPENDENT_AMBULATORY_CARE_PROVIDER_SITE_OTHER): Payer: 59 | Admitting: Ophthalmology

## 2021-05-11 ENCOUNTER — Encounter (INDEPENDENT_AMBULATORY_CARE_PROVIDER_SITE_OTHER): Payer: Self-pay | Admitting: Ophthalmology

## 2021-05-11 ENCOUNTER — Ambulatory Visit (INDEPENDENT_AMBULATORY_CARE_PROVIDER_SITE_OTHER): Payer: 59 | Admitting: Ophthalmology

## 2021-05-11 ENCOUNTER — Ambulatory Visit: Payer: 59 | Admitting: Orthopaedic Surgery

## 2021-05-11 DIAGNOSIS — H2513 Age-related nuclear cataract, bilateral: Secondary | ICD-10-CM

## 2021-05-11 DIAGNOSIS — E113493 Type 2 diabetes mellitus with severe nonproliferative diabetic retinopathy without macular edema, bilateral: Secondary | ICD-10-CM

## 2021-05-11 DIAGNOSIS — E113393 Type 2 diabetes mellitus with moderate nonproliferative diabetic retinopathy without macular edema, bilateral: Secondary | ICD-10-CM

## 2021-05-11 NOTE — Assessment & Plan Note (Signed)
Progression from moderate to severe NPDR now verified by clinical examination.  We will need to continue to monitor funduscopically because of the 60% chance of progression to PDR over the next 6 months. ? ?The nature of severe nonproliferative diabetic retinopathy discussed with the patient as well as the need for more frequent follow up and likely progression to proliferative disease in the near future. The options of continued observation versus panretinal photocoagulation at this time were reviewed as well as the risks, benefits, and alternatives. More recent option includes the use of ocular injectable medications to slow progression of retinal disease. Tight control of glucose, blood pressure, and serum lipid levels were recommended under the direction of general physician or endocrinologist, as well as avoidance of smoking and maintenance of normal body weight. The 2-year risk of progression to proliferative diabetic retinopathy is 60%. ?

## 2021-05-11 NOTE — Assessment & Plan Note (Signed)
Moderate cataract OU overall stable ?

## 2021-05-11 NOTE — Progress Notes (Signed)
05/11/2021     CHIEF COMPLAINT Patient presents for  Chief Complaint  Patient presents with   Diabetic Retinopathy without Macular Edema      HISTORY OF PRESENT ILLNESS: Denise Hale is a 51 y.o. female who presents to the clinic today for:   HPI   6 mos fu ou oct fp. Pt stated no changes in vision but it is still blurry from last visit. Pt denies FOL but sees floaters in both eyes.  Last edited by Silvestre Moment on 05/11/2021  1:41 PM.      Referring physician: Gildardo Pounds, NP [place],  [place] [id number]  HISTORICAL INFORMATION:   Selected notes from the MEDICAL RECORD NUMBER    Lab Results  Component Value Date   HGBA1C 6.6 (A) 02/10/2021     CURRENT MEDICATIONS: No current outpatient medications on file. (Ophthalmic Drugs)   No current facility-administered medications for this visit. (Ophthalmic Drugs)   Current Outpatient Medications (Other)  Medication Sig   pregabalin (LYRICA) 50 MG capsule TAKE 1 CAPSULE BY MOUTH TWICE A DAY   atorvastatin (LIPITOR) 20 MG tablet TAKE 1 TABLET BY MOUTH EVERY DAY   Blood Glucose Monitoring Suppl (ONETOUCH VERIO REFLECT) w/Device KIT Check blood glucose level by fingerstick twice per day. Dx: E11.65 (Patient not taking: Reported on 02/10/2021)   citalopram (CELEXA) 20 MG tablet Take 1 tablet (20 mg total) by mouth daily.   DULoxetine (CYMBALTA) 60 MG capsule TAKE 1 CAPSULE (60 MG TOTAL) BY MOUTH DAILY. FOR BACK PAIN   fluconazole (DIFLUCAN) 150 MG tablet Take 1 tablet (150 mg total) by mouth daily.   glucose blood (ONETOUCH VERIO) test strip Check blood glucose level by fingerstick twice per day. Dx: E11.65 (Patient not taking: Reported on 02/10/2021)   ibuprofen (ADVIL) 800 MG tablet Take 1 tablet (800 mg total) by mouth every 8 (eight) hours as needed for up to 21 doses for fever, headache, mild pain or moderate pain.   insulin aspart (NOVOLOG) 100 UNIT/ML FlexPen FOR BLOOD SUGARS 0-150 GIVE 0 UNITS OF  INSULIN, 151-200 GIVE 2 UNITS OF INSULIN, 201-250 GIVE 4 UNITS, 251-300 GIVE 6 UNITS, 301-350 GIVE 8 UNITS,   insulin lispro (HUMALOG) 100 UNIT/ML KwikPen Humalog KwikPen (U-100) Insulin 100 unit/mL subcutaneous  Inject by subcutaneous route. (Patient not taking: Reported on 02/10/2021)   Insulin Pen Needle (B-D UF III MINI PEN NEEDLES) 31G X 5 MM MISC Use as instructed. Inject into the skin four times daily. E11.65 (Patient not taking: Reported on 02/10/2021)   Lancets (ONETOUCH DELICA PLUS [id number]) MISC Check blood glucose level by fingerstick twice per day. Dx: E11.65 (Patient not taking: Reported on 02/10/2021)   lansoprazole (PREVACID) 30 MG capsule Take 1 capsule (30 mg total) by mouth daily at 12 noon.   linaclotide (LINZESS) 72 MCG capsule Take 1 capsule (72 mcg total) by mouth daily before breakfast.   linagliptin (TRADJENTA) 5 MG TABS tablet Tradjenta 5 mg tablet  Take 1 tablet every day by oral route.   lisinopril (ZESTRIL) 5 MG tablet TAKE 1 TABLET (5 MG TOTAL) BY MOUTH DAILY.   mupirocin ointment (BACTROBAN) 2 % Apply 1 application topically 2 (two) times daily.   nystatin-triamcinolone ointment (MYCOLOG) APPLY THIN FILM TO EXTERNAL VAGINAL AREA DAILY X 5 DAYS. DO NOT INSERT IN VAGINA.   ondansetron (ZOFRAN-ODT) 4 MG disintegrating tablet Take 1 tablet (4 mg total) by mouth every 8 (eight) hours as needed for nausea or vomiting.  tiZANidine (ZANAFLEX) 4 MG tablet TAKE 1 TABLET BY MOUTH EVERY 6 HOURS AS NEEDED FOR MUSCLE SPASMS.   traMADol (ULTRAM) 50 MG tablet Take 1 tablet (50 mg total) by mouth 3 (three) times daily as needed.   TRUEplus Lancets 28G MISC Use as instructed. Check blood glucose level by fingerstick twice per day.   E11.65 (Patient not taking: Reported on 05/05/9765)   TRULICITY 1.5 HA/1.9FX SOPN INJECT 1.5 MG INTO THE SKIN ONCE A WEEK   No current facility-administered medications for this visit. (Other)      REVIEW OF SYSTEMS: ROS   Negative for: Constitutional,  Gastrointestinal, Neurological, Skin, Genitourinary, Musculoskeletal, HENT, Endocrine, Cardiovascular, Eyes, Respiratory, Psychiatric, Allergic/Imm, Heme/Lymph Last edited by Hurman Horn, MD on 05/11/2021  2:37 PM.       ALLERGIES Allergies  Allergen Reactions   Ciprofloxacin    Metformin And Related Diarrhea    PAST MEDICAL HISTORY Past Medical History:  Diagnosis Date   Anxiety    Asthma    Diabetes mellitus without complication ([place])    Diverticulitis    GERD (gastroesophageal reflux disease)    Past Surgical History:  Procedure Laterality Date   CARPAL TUNNEL RELEASE Right 04/08/2020   Procedure: RIGHT CARPAL TUNNEL RELEASE;  Surgeon: Leandrew Koyanagi, MD;  Location: [facility];  Service: Orthopedics;  Laterality: Right;   CHOLECYSTECTOMY     COLON SURGERY      FAMILY HISTORY Family History  Problem Relation Age of Onset   Diabetes Mother    Thyroid disease Mother    Heart disease Father    Breast cancer Paternal Aunt    Breast cancer Paternal Aunt     SOCIAL HISTORY Social History   Tobacco Use   Smoking status: Former   Smokeless tobacco: Never  Scientific laboratory technician Use: Never used  Substance Use Topics   Alcohol use: Not Currently    Comment: occasionally   Drug use: Yes    Types: Marijuana         OPHTHALMIC EXAM:  Base Eye Exam     Visual Acuity (ETDRS)       Right Left   Dist [place] 20/20 20/20         Tonometry (Tonopen, 1:46 PM)       Right Left   Pressure 10 12         Pupils       Pupils APD   Right PERRL None   Left PERRL None         Visual Fields       Left Right    Full Full         Extraocular Movement       Right Left    Full Full         Neuro/Psych     Oriented x3: Yes   Mood/Affect: Normal         Dilation     Both eyes: 1.0% Mydriacyl, 2.5% Phenylephrine @ 1:46 PM           Slit Lamp and Fundus Exam     External Exam       Right Left   External Normal Normal          Slit Lamp Exam       Right Left   Lids/Lashes Normal Normal   Conjunctiva/Sclera White and quiet White and quiet   Cornea Clear Clear   Anterior Chamber Deep and quiet Deep and quiet  Iris Round and reactive Round and reactive   Lens 1+ Nuclear sclerosis 1+ Nuclear sclerosis   Anterior Vitreous Normal Normal         Fundus Exam       Right Left   Posterior Vitreous  Normal   Disc Normal Normal   C/D Ratio 0.35 0.35   Macula no macular thickening, Microaneurysms no macular thickening, Microaneurysms   Vessels NPDR-Severe NPDR-Severe   Periphery Normal Normal            IMAGING AND PROCEDURES  Imaging and Procedures for 05/11/21  OCT, Retina - OU - Both Eyes       Right Eye Quality was good. Scan locations included subfoveal. Central Foveal Thickness: 256. Progression has no prior data. Findings include normal foveal contour, vitreomacular adhesion .   Left Eye Quality was good. Scan locations included subfoveal. Central Foveal Thickness: 226. Progression has no prior data. Findings include normal foveal contour, vitreomacular adhesion .      Color Fundus Photography Optos - OU - Both Eyes       Right Eye Progression has no prior data. Disc findings include normal observations. Macula : microaneurysms.   Left Eye Progression has no prior data. Disc findings include normal observations. Macula : microaneurysms.   Notes Severe NPDR OU.  This is a change from moderate before             ASSESSMENT/PLAN:  Nuclear sclerotic cataract of both eyes Moderate cataract OU overall stable  Severe nonproliferative diabetic retinopathy of both eyes ([place]) Progression from moderate to severe NPDR now verified by clinical examination.  We will need to continue to monitor funduscopically because of the 60% chance of progression to PDR over the next 6 months.  The nature of severe nonproliferative diabetic retinopathy discussed with the patient as well as the  need for more frequent follow up and likely progression to proliferative disease in the near future. The options of continued observation versus panretinal photocoagulation at this time were reviewed as well as the risks, benefits, and alternatives. More recent option includes the use of ocular injectable medications to slow progression of retinal disease. Tight control of glucose, blood pressure, and serum lipid levels were recommended under the direction of general physician or endocrinologist, as well as avoidance of smoking and maintenance of normal body weight. The 2-year risk of progression to proliferative diabetic retinopathy is 60%.      ICD-10-CM   1. 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 Moderate nonproliferative diabetic retinopathy of both eyes without macular edema associated with type 2 diabetes mellitus ([facility])  J47.8295 OCT, Retina - OU - Both Eyes    Color Fundus Photography Optos - OU - Both Eyes    3. Nuclear sclerotic cataract of both eyes  H25.13       1.  OU, progression to severe NPDR we will continue to monitor.  2.  Bilateral NSC changes but with good acuity will continue to monitor.  3.  Likely need angiographic analysis next in order to evaluate for retinal nonperfusion peripherally.  Ophthalmic Meds Ordered this visit:  No orders of the defined types were placed in this encounter.      Return in about 4 months (around 09/11/2021) for DILATE OU, COLOR FP, OPTOS FFA L/R.  There are no Patient Instructions on file for this visit.   Explained the diagnoses, plan, and  follow up with the patient and they expressed understanding.  Patient expressed understanding of the importance of proper follow up care.   Clent Demark Hugh Garrow M.D. Diseases & Surgery of the Retina and Vitreous Retina & Diabetic [place] 05/11/21     Abbreviations: M myopia (nearsighted); A  astigmatism; H hyperopia (farsighted); P presbyopia; Mrx spectacle prescription;  CTL contact lenses; OD right eye; OS left eye; OU both eyes  XT exotropia; ET esotropia; PEK punctate epithelial keratitis; PEE punctate epithelial erosions; DES dry eye syndrome; MGD meibomian gland dysfunction; ATs artificial tears; PFAT's preservative free artificial tears; [place] nuclear sclerotic cataract; PSC posterior subcapsular cataract; ERM epi-retinal membrane; PVD posterior vitreous detachment; RD retinal detachment; DM diabetes mellitus; DR diabetic retinopathy; NPDR non-proliferative diabetic retinopathy; PDR proliferative diabetic retinopathy; CSME clinically significant macular edema; DME diabetic macular edema; dbh dot blot hemorrhages; CWS cotton wool spot; POAG primary open angle glaucoma; C/D cup-to-disc ratio; HVF humphrey visual field; GVF goldmann visual field; OCT optical coherence tomography; IOP intraocular pressure; BRVO Branch retinal vein occlusion; CRVO central retinal vein occlusion; CRAO central retinal artery occlusion; BRAO branch retinal artery occlusion; RT retinal tear; SB scleral buckle; PPV pars plana vitrectomy; VH Vitreous hemorrhage; PRP panretinal laser photocoagulation; IVK intravitreal kenalog; VMT vitreomacular traction; MH Macular hole;  NVD neovascularization of the disc; NVE neovascularization elsewhere; AREDS age related eye disease study; ARMD age related macular degeneration; POAG primary open angle glaucoma; EBMD epithelial/anterior basement membrane dystrophy; ACIOL anterior chamber intraocular lens; IOL intraocular lens; PCIOL posterior chamber intraocular lens; Phaco/IOL phacoemulsification with intraocular lens placement; [place] photorefractive keratectomy; LASIK laser assisted in situ keratomileusis; HTN hypertension; DM diabetes mellitus; COPD chronic obstructive pulmonary disease

## 2021-05-12 ENCOUNTER — Encounter: Payer: Self-pay | Admitting: Nurse Practitioner

## 2021-05-12 ENCOUNTER — Ambulatory Visit: Payer: 59 | Attending: Nurse Practitioner | Admitting: Nurse Practitioner

## 2021-05-12 VITALS — BP 128/83 | HR 96 | Wt 128.6 lb

## 2021-05-12 DIAGNOSIS — E1151 Type 2 diabetes mellitus with diabetic peripheral angiopathy without gangrene: Secondary | ICD-10-CM | POA: Diagnosis not present

## 2021-05-12 DIAGNOSIS — F419 Anxiety disorder, unspecified: Secondary | ICD-10-CM

## 2021-05-12 DIAGNOSIS — M5442 Lumbago with sciatica, left side: Secondary | ICD-10-CM

## 2021-05-12 DIAGNOSIS — K429 Umbilical hernia without obstruction or gangrene: Secondary | ICD-10-CM

## 2021-05-12 DIAGNOSIS — E785 Hyperlipidemia, unspecified: Secondary | ICD-10-CM

## 2021-05-12 DIAGNOSIS — R1033 Periumbilical pain: Secondary | ICD-10-CM

## 2021-05-12 DIAGNOSIS — R110 Nausea: Secondary | ICD-10-CM

## 2021-05-12 DIAGNOSIS — G8929 Other chronic pain: Secondary | ICD-10-CM

## 2021-05-12 DIAGNOSIS — F32A Depression, unspecified: Secondary | ICD-10-CM

## 2021-05-12 MED ORDER — ATORVASTATIN CALCIUM 20 MG PO TABS: 20.0000 mg | ORAL_TABLET | Freq: Every day | ORAL | 3 refills | Status: DC

## 2021-05-12 MED ORDER — CITALOPRAM HYDROBROMIDE 20 MG PO TABS: 20.0000 mg | ORAL_TABLET | Freq: Every day | ORAL | 0 refills | Status: DC

## 2021-05-12 MED ORDER — GLIPIZIDE 5 MG PO TABS: 5.0000 mg | ORAL_TABLET | Freq: Two times a day (BID) | ORAL | 1 refills | Status: DC

## 2021-05-12 MED ORDER — DULOXETINE HCL 60 MG PO CPEP: 60.0000 mg | ORAL_CAPSULE | Freq: Every day | ORAL | 1 refills | Status: DC

## 2021-05-12 MED ORDER — GLIPIZIDE 5 MG PO TABS: 2.5000 mg | ORAL_TABLET | Freq: Two times a day (BID) | ORAL | 1 refills | Status: DC

## 2021-05-12 MED ORDER — ONDANSETRON 4 MG PO TBDP: 4.0000 mg | ORAL_TABLET | Freq: Three times a day (TID) | ORAL | 0 refills | Status: DC | PRN

## 2021-05-12 NOTE — Progress Notes (Signed)
? ?Assessment & Plan:  ?Carizma was seen today for hypertension. ? ?Diagnoses and all orders for this visit: ? ?Type 2 diabetes mellitus with diabetic peripheral angiopathy without gangrene, without long-term current use of insulin ([place]) ?STOP TRULICITY and pregabalin due to worsening retinopathy ?-     glipiZIDE (GLUCOTROL) 5 MG tablet; Take 1 tablet (5 mg total) by mouth 2 (two) times daily before a meal. ?-     CMP14+EGFR ? ?Anxiety and depression ?-     citalopram (CELEXA) 20 MG tablet; Take 1 tablet (20 mg total) by mouth daily. ?Continue follow up with therapy  ? ?Chronic bilateral low back pain with left-sided sciatica ?-     DULoxetine (CYMBALTA) 60 MG capsule; Take 1 capsule (60 mg total) by mouth daily. For back pain ? ?Dyslipidemia, goal LDL below 70 ?Well controlled  ?-     atorvastatin (LIPITOR) 20 MG tablet; Take 1 tablet (20 mg total) by mouth daily. ? ?Periumbilical abdominal pain ?-     US Abdomen Complete; Future ?-     Ambulatory referral to Gastroenterology ? ?Chronic nausea ?-     Ambulatory referral to Gastroenterology ?-     ondansetron (ZOFRAN-ODT) 4 MG disintegrating tablet; Take 1 tablet (4 mg total) by mouth every 8 (eight) hours as needed for nausea or vomiting. ? ?Umbilical hernia without obstruction and without gangrene ?-     Ambulatory referral to General Surgery ? ? ? ?Patient has been counseled on age-appropriate routine health concerns for screening and prevention. These are reviewed and up-to-date. Referrals have been placed accordingly. Immunizations are up-to-date or declined.    ?Subjective:  ? ?Chief Complaint  ?Patient presents with  ? Hypertension  ? ?HPI ?Denise Hale 52 y.o. female presents to office today for follow up to HTN, HPL and DM ?She has a past medical history of Anxiety, Asthma, DM2, Diverticulitis, GERD, and Severe nonproliferative diabetic retinopathy.  ?. ?DM ?She was started on Trulicity 1.61WR weekly on 10-30-2019 . It was increased to 1.5 mg weekly on  11-27-2019. Starting weight at that time was 150 and she is now 128. Most recent eye exam on 05-11-2021 noted for  ?Bilaterl nuclear sclerosis, microaneurysms and sever non proliferative diabetic retinopathy which as worsened (60% progression)over the past 6 months since he last saw ophthalmology. At this time we will stop trulicity. She is very upset and tearful stating the Trulicity has worsened her eye sight. Although her diabetes is currently controlled up until 02-27-2020 her A1c had ranged between 10.7 and 12.8. At this time we will increase glipizide to 5 mg BID. She can not tolerate metformin. She is no longer taking any insulins. Blood pressure is somewhat controlled although diastolic not optimal. She is taking renal dose ACE. LDL at goal with atorvastatin 20 mg daily. Diabetic complications include peripheral neuropathy for which she takes lyrica. I believe we should discontinue this as well as this can cause worsening visual deficits as well.  ?Lab Results  ?Component Value Date  ? HGBA1C 6.6 (A) 02/10/2021  ?   ?BP Readings from Last 3 Encounters:  ?05/12/21 128/83  ?04/20/21 (!) 162/94  ?02/10/21 139/87  ?  ?Lab Results  ?Component Value Date  ? [place] 57 02/10/2021  ?  ? ?GI ?She endorses worsening nausea with occasional vomiting over the past several months. Associated symptoms include periumbilical pain. She does have a history of diverticulitis and constipation. WBC normal. She denies fever, melena or hematochezia. She does smoke marijuana daily. She has  never had a colonoscopy however her recent FOBT was negative (05-13-2021).Abdominal pain is colicky in nature. Occurring daily and unrelated to dietary intake. She has a small umbilical hernia and believes this is contributing to her pain. I have referred her to [facility] for evaluation.  ? ? ?Anxiety and Depression ?She is seeing a therapist for this. States her celexa was increased to 56m however I am unable to verify this as she does not have her  medications with her today.  ? ? ?Review of Systems  ?Constitutional:  Negative for fever, malaise/fatigue and weight loss.  ?HENT: Negative.  Negative for nosebleeds.   ?Eyes: Negative.  Negative for blurred vision, double vision and photophobia.  ?Respiratory: Negative.  Negative for cough and shortness of breath.   ?Cardiovascular: Negative.  Negative for chest pain, palpitations and leg swelling.  ?Gastrointestinal:  Positive for abdominal pain, constipation, nausea and vomiting. Negative for blood in stool, diarrhea, heartburn and melena.  ?Genitourinary: Negative.   ?Musculoskeletal:  Positive for back pain and myalgias.  ?Neurological:  Positive for sensory change. Negative for dizziness, focal weakness, seizures and headaches.  ?Psychiatric/Behavioral:  Positive for depression. Negative for suicidal ideas. The patient is nervous/anxious.   ? ?Past Medical History:  ?Diagnosis Date  ? Anxiety   ? Asthma   ? Diverticulitis   ? DM (diabetes mellitus), type 2 with ophthalmic complications ([facility]   ? GERD (gastroesophageal reflux disease)   ? Severe nonproliferative diabetic retinopathy ([facility]   ? ? ?Past Surgical History:  ?Procedure Laterality Date  ? CARPAL TUNNEL RELEASE Right 04/08/2020  ? Procedure: RIGHT CARPAL TUNNEL RELEASE;  Surgeon: XLeandrew Koyanagi MD;  Location: [facility]  Service: Orthopedics;  Laterality: Right;  ? CHOLECYSTECTOMY    ? COLON SURGERY    ? ? ?Family History  ?Problem Relation Age of Onset  ? Diabetes Mother   ? Thyroid disease Mother   ? Heart disease Father   ? Breast cancer Paternal Aunt   ? Breast cancer Paternal Aunt   ? ? ?Social History Reviewed with no changes to be made today.  ? ?Outpatient Medications Prior to Visit  ?Medication Sig Dispense Refill  ? Blood Glucose Monitoring Suppl (ONETOUCH VERIO REFLECT) w/Device KIT Check blood glucose level by fingerstick twice per day. Dx: E11.65 (Patient not taking: Reported on 02/10/2021) 1 kit 0  ? ibuprofen (ADVIL) 800 MG  tablet Take 1 tablet (800 mg total) by mouth every 8 (eight) hours as needed for up to 21 doses for fever, headache, mild pain or moderate pain. 21 tablet 0  ? lansoprazole (PREVACID) 30 MG capsule Take 1 capsule (30 mg total) by mouth daily at 12 noon. 90 capsule 1  ? linaclotide (LINZESS) 72 MCG capsule Take 1 capsule (72 mcg total) by mouth daily before breakfast. 90 capsule 2  ? lisinopril (ZESTRIL) 5 MG tablet TAKE 1 TABLET (5 MG TOTAL) BY MOUTH DAILY. 90 tablet 0  ? mupirocin ointment (BACTROBAN) 2 % Apply 1 application topically 2 (two) times daily. 60 g 0  ? nystatin-triamcinolone ointment (MYCOLOG) APPLY THIN FILM TO EXTERNAL VAGINAL AREA DAILY X 5 DAYS. DO NOT INSERT IN VAGINA. 30 g 0  ? tiZANidine (ZANAFLEX) 4 MG tablet TAKE 1 TABLET BY MOUTH EVERY 6 HOURS AS NEEDED FOR MUSCLE SPASMS. 60 tablet 1  ? traMADol (ULTRAM) 50 MG tablet Take 1 tablet (50 mg total) by mouth 3 (three) times daily as needed. 30 tablet 2  ? TRUEplus Lancets 28G MISC Use  as instructed. Check blood glucose level by fingerstick twice per day.   E11.65 (Patient not taking: Reported on 02/10/2021) 100 each 3  ? atorvastatin (LIPITOR) 20 MG tablet TAKE 1 TABLET BY MOUTH EVERY DAY 90 tablet 0  ? citalopram (CELEXA) 20 MG tablet Take 1 tablet (20 mg total) by mouth daily. 90 tablet 0  ? DULoxetine (CYMBALTA) 60 MG capsule TAKE 1 CAPSULE (60 MG TOTAL) BY MOUTH DAILY. FOR BACK PAIN 90 capsule 1  ? fluconazole (DIFLUCAN) 150 MG tablet Take 1 tablet (150 mg total) by mouth daily. 3 tablet 0  ? glucose blood (ONETOUCH VERIO) test strip Check blood glucose level by fingerstick twice per day. Dx: E11.65 (Patient not taking: Reported on 02/10/2021) 100 each 2  ? insulin aspart (NOVOLOG) 100 UNIT/ML FlexPen FOR BLOOD SUGARS 0-150 GIVE 0 UNITS OF INSULIN, 151-200 GIVE 2 UNITS OF INSULIN, 201-250 GIVE 4 UNITS, 251-300 GIVE 6 UNITS, 301-350 GIVE 8 UNITS, 15 mL 11  ? insulin lispro (HUMALOG) 100 UNIT/ML KwikPen Humalog KwikPen (U-100) Insulin 100 unit/mL  subcutaneous ? Inject by subcutaneous route. (Patient not taking: Reported on 02/10/2021)    ? Insulin Pen Needle (B-D UF III MINI PEN NEEDLES) 31G X 5 MM MISC Use as instructed. Inject into the skin four times dai

## 2021-05-13 ENCOUNTER — Other Ambulatory Visit: Payer: Self-pay | Admitting: Nurse Practitioner

## 2021-05-13 ENCOUNTER — Encounter: Payer: Self-pay | Admitting: Nurse Practitioner

## 2021-05-13 DIAGNOSIS — R748 Abnormal levels of other serum enzymes: Secondary | ICD-10-CM

## 2021-05-13 LAB — CMP14+EGFR
ALT: 69 IU/L — ABNORMAL HIGH (ref 0–32)
AST: 49 IU/L — ABNORMAL HIGH (ref 0–40)
Albumin/Globulin Ratio: 1.3 (ref 1.2–2.2)
Albumin: 4.2 g/dL (ref 3.8–4.9)
Alkaline Phosphatase: 154 IU/L — ABNORMAL HIGH (ref 44–121)
BUN/Creatinine Ratio: 10 (ref 9–23)
BUN: 5 mg/dL — ABNORMAL LOW (ref 6–24)
Bilirubin Total: 0.3 mg/dL (ref 0.0–1.2)
CO2: 25 mmol/L (ref 20–29)
Calcium: 9.3 mg/dL (ref 8.7–10.2)
Chloride: 104 mmol/L (ref 96–106)
Creatinine, Ser: 0.52 mg/dL — ABNORMAL LOW (ref 0.57–1.00)
Globulin, Total: 3.2 g/dL (ref 1.5–4.5)
Glucose: 126 mg/dL — ABNORMAL HIGH (ref 70–99)
Potassium: 4.1 mmol/L (ref 3.5–5.2)
Sodium: 142 mmol/L (ref 134–144)
Total Protein: 7.4 g/dL (ref 6.0–8.5)
eGFR: 112 mL/min/{1.73_m2} (ref >59)

## 2021-05-13 LAB — FECAL OCCULT BLOOD, IMMUNOCHEMICAL: Fecal Occult Bld: NEGATIVE

## 2021-05-18 ENCOUNTER — Ambulatory Visit: Payer: 59 | Admitting: Orthopaedic Surgery

## 2021-05-18 ENCOUNTER — Encounter: Payer: Self-pay | Admitting: Orthopaedic Surgery

## 2021-05-18 DIAGNOSIS — M65332 Trigger finger, left middle finger: Secondary | ICD-10-CM | POA: Diagnosis not present

## 2021-05-18 NOTE — Progress Notes (Signed)
? ?Office Visit Note ?  ?Patient: Denise Hale           ?Date of Birth: 1970-09-03           ?MRN: [id number] ?Visit Date: 05/18/2021 ?             ?Requested by: Claiborne Rigg, NP ?[address] [place] ?[address] ?[place],  [place] [address] ?PCP: Claiborne Rigg, NP ? ? ?Assessment & Plan: ?Visit Diagnoses:  ?1. Trigger finger, left middle finger   ? ? ?Plan: Impression is left middle finger stenosing tenosynovitis.  Based on her options she would like to move forward with scheduling for trigger finger release.  Risk benefits rehab recovery prognosis reviewed with the patient.  All questions answered.  Eunice Blase will call the patient. ? ?Follow-Up Instructions: No follow-ups on file.  ? ?Orders:  ?No orders of the defined types were placed in this encounter. ? ?No orders of the defined types were placed in this encounter. ? ? ? ? Procedures: ?No procedures performed ? ? ?Clinical Data: ?No additional findings. ? ? ?Subjective: ?Chief Complaint  ?Patient presents with  ? Left Hand - Pain  ?  Left middle trigger finger  ? Right Hand - Pain  ?  Ring trigger finger  ? ? ?HPI ? ?[name] comes in today for follow-up of left middle trigger finger.  She would like to get surgery scheduled for this.  She is undergone a cortisone injection which gave temporary relief.  Continues to have a lot of difficulty with the finger. ? ?Review of Systems  ?Constitutional: Negative.   ?HENT: Negative.    ?Eyes: Negative.   ?Respiratory: Negative.    ?Cardiovascular: Negative.   ?Endocrine: Negative.   ?Musculoskeletal: Negative.   ?Neurological: Negative.   ?Hematological: Negative.   ?Psychiatric/Behavioral: Negative.    ?All other systems reviewed and are negative. ? ? ?Objective: ?Vital Signs: There were no vitals taken for this visit. ? ?Physical Exam ?Vitals and nursing note reviewed.  ?Constitutional:   ?   Appearance: She is well-developed.  ?Pulmonary:  ?   Effort: Pulmonary effort is normal.  ?Skin: ?   General: Skin is warm.  ?    Capillary Refill: Capillary refill takes less than 2 seconds.  ?Neurological:  ?   Mental Status: She is alert and oriented to person, place, and time.  ?Psychiatric:     ?   Behavior: Behavior normal.     ?   Thought Content: Thought content normal.     ?   Judgment: Judgment normal.  ? ? ?Ortho Exam ? ?Examination of the left long finger shows tenderness of the A1 pulley.  Pain with digital arc of motion.  Arc of motion is normal. ? ?Specialty Comments:  ?No specialty comments available. ? ?Imaging: ?No results found. ? ? ?PMFS History: ?Patient Active Problem List  ? Diagnosis Date Noted  ? Trigger finger, left middle finger 05/18/2021  ? Type 2 diabetes mellitus with diabetic peripheral angiopathy without gangrene, without long-term current use of insulin ([facility]) 05/12/2021  ? 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 Severe nonproliferative diabetic retinopathy of both eyes ([facility]) 11/04/2020  ? Nuclear sclerotic cataract of both eyes 11/04/2020  ? Carpal tunnel syndrome on right 04/07/2020  ? DM2 (diabetes mellitus, type 2) ([facility]) 10/30/2019  ? Influenza vaccine needed 10/30/2019  ? ?Past Medical History:  ?Diagnosis Date  ? Anxiety   ? Asthma   ? Diverticulitis   ? DM (diabetes  mellitus), type 2 with ophthalmic complications ([facility])   ? GERD (gastroesophageal reflux disease)   ? Severe nonproliferative diabetic retinopathy ([facility])   ?  ?Family History  ?Problem Relation Age of Onset  ? Diabetes Mother   ? Thyroid disease Mother   ? Heart disease Father   ? Breast cancer Paternal Aunt   ? Breast cancer Paternal Aunt   ?  ?Past Surgical History:  ?Procedure Laterality Date  ? CARPAL TUNNEL RELEASE Right 04/08/2020  ? Procedure: RIGHT CARPAL TUNNEL RELEASE;  Surgeon: Tarry Kos, MD;  Location: [facility];  Service: Orthopedics;  Laterality: Right;  ? CHOLECYSTECTOMY    ? COLON SURGERY    ? ?Social History  ? ?Occupational History  ? Not on file  ?Tobacco Use  ? Smoking status: Former  ? Smokeless  tobacco: Never  ?Vaping Use  ? Vaping Use: Never used  ?Substance and Sexual Activity  ? Alcohol use: Not Currently  ?  Comment: occasionally  ? Drug use: Yes  ?  Types: Marijuana  ? Sexual activity: Yes  ?  Birth control/protection: None  ? ? ? ? ? ? ?

## 2021-05-19 ENCOUNTER — Other Ambulatory Visit: Payer: Self-pay | Admitting: Nurse Practitioner

## 2021-05-19 DIAGNOSIS — M792 Neuralgia and neuritis, unspecified: Secondary | ICD-10-CM

## 2021-05-20 NOTE — Telephone Encounter (Signed)
Will forward to provider  

## 2021-06-02 ENCOUNTER — Encounter (HOSPITAL_BASED_OUTPATIENT_CLINIC_OR_DEPARTMENT_OTHER): Admission: RE | Payer: Self-pay | Source: Home / Self Care

## 2021-06-02 ENCOUNTER — Ambulatory Visit (HOSPITAL_BASED_OUTPATIENT_CLINIC_OR_DEPARTMENT_OTHER): Admission: RE | Admit: 2021-06-02 | Payer: 59 | Source: Home / Self Care | Admitting: Orthopaedic Surgery

## 2021-06-02 SURGERY — RELEASE, A1 PULLEY, FOR TRIGGER FINGER
Anesthesia: Monitor Anesthesia Care | Site: Middle Finger | Laterality: Left

## 2021-06-09 ENCOUNTER — Encounter: Payer: 59 | Admitting: Orthopaedic Surgery

## 2021-06-18 ENCOUNTER — Other Ambulatory Visit: Payer: Self-pay | Admitting: Nurse Practitioner

## 2021-06-18 DIAGNOSIS — M792 Neuralgia and neuritis, unspecified: Secondary | ICD-10-CM

## 2021-06-18 NOTE — Telephone Encounter (Signed)
Requested medication (s) are due for refill today: No  Requested medication (s) are on the active medication list: No  Last refill:  D/C 05/13/21  Future visit scheduled: No  Notes to clinic:  Not delegated.    Requested Prescriptions  Pending Prescriptions Disp Refills   pregabalin (LYRICA) 50 MG capsule [Pharmacy Med Name: PREGABALIN 50 MG CAPSULE] 60 capsule 0    Sig: TAKE 1 CAPSULE BY MOUTH TWICE A DAY     Not Delegated - Neurology:  Anticonvulsants - Controlled - pregabalin Failed - 06/18/2021  3:45 PM      Failed - This refill cannot be delegated      Failed - Cr in normal range and within 360 days    Creatinine, Ser  Date Value Ref Range Status  05/12/2021 0.52 (L) 0.57 - 1.00 mg/dL Final         Passed - Completed PHQ-2 or PHQ-9 in the last 360 days      Passed - Valid encounter within last 12 months    Recent Outpatient Visits           1 month ago Type 2 diabetes mellitus with diabetic peripheral angiopathy without gangrene, without long-term current use of insulin (HCC)   [facility] [place], [place] W, NP   4 months ago Type 2 diabetes mellitus with diabetic polyneuropathy, with long-term current use of insulin (HCC)   [facility] [place], Shea Stakes, NP   6 months ago Anxiety and depression   [facility] [place], [place] W, NP   7 months ago Peripheral neuropathic pain   [place] [place], [place] W, NP   9 months ago Type 2 diabetes mellitus with diabetic polyneuropathy, with long-term current use of insulin [facility])   [facility] [place], Shea Stakes, NP

## 2021-07-05 ENCOUNTER — Ambulatory Visit: Payer: Self-pay | Admitting: *Deleted

## 2021-07-05 ENCOUNTER — Encounter: Payer: Self-pay | Admitting: Physician Assistant

## 2021-07-05 NOTE — Telephone Encounter (Signed)
  Chief Complaint: requesting appt. Elevated blood glucose  Symptoms:  blood glucose now 456. Has been taking 1/2 glipizide. Stopped trulicity per patient per orders of PCP due to not feeling well when taking insulin. Glucose gradually going up. Was in the 150's . Patient crying in pain due to c/o vaginal pain itching "yeast infection". Bleeding due to scratching from outer vaginal area per patient . Headaches, weakness and tired all of the time.  Frequency: today  Pertinent Negatives: Patient denies chest pain no difficulty breathing. Denies urinary frequency. No dizziness or vomiting  Disposition: [x] ED /[] Urgent Care (no appt availability in office) / [] Appointment(In office/virtual)/ []  [place] [facility]/ [] Home Care/ [] Refused Recommended Disposition /[] [place] [facility]/ []  Follow-up with PCP Additional Notes:   Please advise regarding what medications for diabetic management. Patient going to ED but will need f/u with PCP regarding medications.     Reason for Disposition  Patient sounds very sick or weak to the triager  Answer Assessment - Initial Assessment Questions 1. BLOOD GLUCOSE: "What is your blood glucose level?"      456 2. ONSET: "When did you check the blood glucose?"     Now  3. USUAL RANGE: "What is your glucose level usually?" (e.g., usual fasting morning value, usual evening value)     150's 4. KETONES: "Do you check for ketones (urine or blood test strips)?" If yes, ask: "What does the test show now?"      na 5. TYPE 1 or 2:  "Do you know what type of diabetes you have?"  (e.g., Type 1, Type 2, Gestational; doesn't know)      Type 2  6. INSULIN: "Do you take insulin?" "What type of insulin(s) do you use? What is the mode of delivery? (syringe, pen; injection or pump)?"      Not anymore  7. DIABETES PILLS: "Do you take any pills for your diabetes?" If yes, ask: "Have you missed taking any pills recently?"     Glipizide taking 1/2 a pill. Stopped trulicity  due to SE.  8. OTHER SYMPTOMS: "Do you have any symptoms?" (e.g., fever, frequent urination, difficulty breathing, dizziness, weakness, vomiting)     Headache weakness tired all of the time. Patient in pain due to possible yeast infection 9. PREGNANCY: "Is there any chance you are pregnant?" "When was your last menstrual period?"     na  Protocols used: Diabetes - High Blood Sugar-A-AH

## 2021-07-06 ENCOUNTER — Other Ambulatory Visit: Payer: Self-pay

## 2021-07-06 ENCOUNTER — Emergency Department (HOSPITAL_COMMUNITY)
Admission: EM | Admit: 2021-07-06 | Discharge: 2021-07-06 | Disposition: A | Discharge status: Home / Self Care | Payer: 59 | Attending: Emergency Medicine | Admitting: Emergency Medicine

## 2021-07-06 ENCOUNTER — Emergency Department (HOSPITAL_COMMUNITY): Payer: 59

## 2021-07-06 DIAGNOSIS — Z79899 Other long term (current) drug therapy: Secondary | ICD-10-CM | POA: Insufficient documentation

## 2021-07-06 DIAGNOSIS — R1084 Generalized abdominal pain: Secondary | ICD-10-CM | POA: Diagnosis not present

## 2021-07-06 DIAGNOSIS — B3731 Acute candidiasis of vulva and vagina: Secondary | ICD-10-CM | POA: Diagnosis not present

## 2021-07-06 DIAGNOSIS — Z7984 Long term (current) use of oral hypoglycemic drugs: Secondary | ICD-10-CM | POA: Insufficient documentation

## 2021-07-06 DIAGNOSIS — I10 Essential (primary) hypertension: Secondary | ICD-10-CM | POA: Diagnosis not present

## 2021-07-06 DIAGNOSIS — R634 Abnormal weight loss: Secondary | ICD-10-CM | POA: Insufficient documentation

## 2021-07-06 DIAGNOSIS — E1165 Type 2 diabetes mellitus with hyperglycemia: Secondary | ICD-10-CM | POA: Insufficient documentation

## 2021-07-06 DIAGNOSIS — R630 Anorexia: Secondary | ICD-10-CM | POA: Diagnosis not present

## 2021-07-06 DIAGNOSIS — R109 Unspecified abdominal pain: Secondary | ICD-10-CM | POA: Diagnosis present

## 2021-07-06 LAB — CBC WITH DIFFERENTIAL/PLATELET
Abs Immature Granulocytes: 0.02 10*3/uL (ref 0.00–0.07)
Basophils Absolute: 0 10*3/uL (ref 0.0–0.1)
Basophils Relative: 1 %
Eosinophils Absolute: 0.2 10*3/uL (ref 0.0–0.5)
Eosinophils Relative: 2 %
HCT: 35.6 % — ABNORMAL LOW (ref 36.0–46.0)
Hemoglobin: 11.8 g/dL — ABNORMAL LOW (ref 12.0–15.0)
Immature Granulocytes: 0 %
Lymphocytes Relative: 40 %
Lymphs Abs: 3 10*3/uL (ref 0.7–4.0)
MCH: 30.1 pg (ref 26.0–34.0)
MCHC: 33.1 g/dL (ref 30.0–36.0)
MCV: 90.8 fL (ref 80.0–100.0)
Monocytes Absolute: 0.6 10*3/uL (ref 0.1–1.0)
Monocytes Relative: 8 %
Neutro Abs: 3.8 10*3/uL (ref 1.7–7.7)
Neutrophils Relative %: 49 %
Platelets: 318 10*3/uL (ref 150–400)
RBC: 3.92 MIL/uL (ref 3.87–5.11)
RDW: 13.6 % (ref 11.5–15.5)
WBC: 7.6 10*3/uL (ref 4.0–10.5)
nRBC: 0 % (ref 0.0–0.2)

## 2021-07-06 LAB — COMPREHENSIVE METABOLIC PANEL
ALT: 67 U/L — ABNORMAL HIGH (ref 0–44)
AST: 37 U/L (ref 15–41)
Albumin: 3.8 g/dL (ref 3.5–5.0)
Alkaline Phosphatase: 221 U/L — ABNORMAL HIGH (ref 38–126)
Anion gap: 9 (ref 5–15)
BUN: 11 mg/dL (ref 6–20)
CO2: 28 mmol/L (ref 22–32)
Calcium: 9.2 mg/dL (ref 8.9–10.3)
Chloride: 98 mmol/L (ref 98–111)
Creatinine, Ser: 0.58 mg/dL (ref 0.44–1.00)
GFR, Estimated: >60 mL/min (ref >60)
Glucose, Bld: 406 mg/dL — ABNORMAL HIGH (ref 70–99)
Potassium: 3.4 mmol/L — ABNORMAL LOW (ref 3.5–5.1)
Sodium: 135 mmol/L (ref 135–145)
Total Bilirubin: 0.5 mg/dL (ref 0.3–1.2)
Total Protein: 7.7 g/dL (ref 6.5–8.1)

## 2021-07-06 LAB — URINALYSIS, ROUTINE W REFLEX MICROSCOPIC
Bacteria, UA: NONE SEEN
Bilirubin Urine: NEGATIVE
Glucose, UA: >=500 mg/dL — AB
Hgb urine dipstick: NEGATIVE
Ketones, ur: NEGATIVE mg/dL
Leukocytes,Ua: NEGATIVE
Nitrite: NEGATIVE
Protein, ur: 100 mg/dL — AB
Specific Gravity, Urine: 1.038 — ABNORMAL HIGH (ref 1.005–1.030)
pH: 5 (ref 5.0–8.0)

## 2021-07-06 LAB — CBG MONITORING, ED
Glucose-Capillary: 421 mg/dL — ABNORMAL HIGH (ref 70–99)
Glucose-Capillary: 501 mg/dL (ref 70–99)
Glucose-Capillary: 395 mg/dL — ABNORMAL HIGH (ref 70–99)

## 2021-07-06 LAB — HEMOGLOBIN A1C
Hgb A1c MFr Bld: 9.4 % — ABNORMAL HIGH (ref 4.8–5.6)
Mean Plasma Glucose: 223.08 mg/dL

## 2021-07-06 LAB — BLOOD GAS, VENOUS
Acid-Base Excess: 7.5 mmol/L — ABNORMAL HIGH (ref 0.0–2.0)
Bicarbonate: 34.1 mmol/L — ABNORMAL HIGH (ref 20.0–28.0)
O2 Saturation: 30.7 %
Patient temperature: 37
pCO2, Ven: 55 mmHg (ref 44–60)
pH, Ven: 7.4 (ref 7.25–7.43)
pO2, Ven: <31 mmHg — CL (ref 32–45)

## 2021-07-06 LAB — RPR: RPR Ser Ql: NONREACTIVE

## 2021-07-06 LAB — WET PREP, GENITAL
Clue Cells Wet Prep HPF POC: NONE SEEN
Sperm: NONE SEEN
Trich, Wet Prep: NONE SEEN
WBC, Wet Prep HPF POC: >=10 — AB (ref <10)
Yeast Wet Prep HPF POC: NONE SEEN

## 2021-07-06 LAB — HIV ANTIBODY (ROUTINE TESTING W REFLEX): HIV Screen 4th Generation wRfx: NONREACTIVE

## 2021-07-06 MED ORDER — LACTATED RINGERS IV BOLUS
1000.0000 mL | Freq: Once | INTRAVENOUS | Status: AC
  Administered 2021-07-06: 1000 mL via INTRAVENOUS

## 2021-07-06 MED ORDER — INSULIN ASPART 100 UNIT/ML IJ SOLN
10.0000 [IU] | Freq: Once | INTRAMUSCULAR | Status: AC
  Administered 2021-07-06: 10 [IU] via SUBCUTANEOUS
  Filled 2021-07-06: qty 0.1

## 2021-07-06 MED ORDER — GLIPIZIDE 5 MG PO TABS: 5.0000 mg | ORAL_TABLET | Freq: Every day | ORAL | 0 refills | Status: DC

## 2021-07-06 MED ORDER — LISINOPRIL 10 MG PO TABS
10.0000 mg | ORAL_TABLET | Freq: Every day | ORAL | 0 refills | Status: DC
Start: 2021-07-06 — End: 2022-01-05

## 2021-07-06 MED ORDER — LISINOPRIL 10 MG PO TABS
10.0000 mg | ORAL_TABLET | Freq: Once | ORAL | Status: AC
  Administered 2021-07-06: 10 mg via ORAL
  Filled 2021-07-06: qty 1

## 2021-07-06 MED ORDER — IOHEXOL 300 MG/ML  SOLN
100.0000 mL | Freq: Once | INTRAMUSCULAR | Status: AC | PRN
  Administered 2021-07-06: 100 mL via INTRAVENOUS

## 2021-07-06 MED ORDER — FLUCONAZOLE 150 MG PO TABS: 150.0000 mg | ORAL_TABLET | Freq: Every day | ORAL | 0 refills | Status: DC

## 2021-07-06 MED ORDER — FLUCONAZOLE 150 MG PO TABS
150.0000 mg | ORAL_TABLET | Freq: Once | ORAL | Status: AC
Start: 2021-07-06 — End: 2021-07-06
  Administered 2021-07-06: 150 mg via ORAL
  Filled 2021-07-06: qty 1

## 2021-07-06 NOTE — ED Notes (Signed)
[facility]  PO2 < 31 Critical Value  Reported to M. Pfeiffer

## 2021-07-06 NOTE — ED Triage Notes (Signed)
Pt states that she stopped taking her diabetic medication because she lost too much weight. Pt reports feeling sick x 1 week. Pt does report that she still takes her glipizide. Pt states that she also has a yeast infection.

## 2021-07-06 NOTE — Discharge Instructions (Addendum)
1.  Keep a log of your blood pressures twice daily.  Due to your increased blood pressure, start lisinopril 10 mg daily.  If your blood pressures are getting too low, below 110/60s, go back to 5 mg daily.  Continue to monitor your blood pressures to review with your physician. 2.  Increase your glipizide to 5 mg daily.  Monitor your blood sugars 2-3 times a day and immediately if you feel dizzy nauseated or lightheaded.  If your blood sugars are reading lower than 70s, go back to your 2.5 mg dose and review with your physician.  Return immediately to the emergency department if you have signs of low blood sugar and it is not responding to additional foods such as orange juice, peanut butter or other carbohydrate foods. 3.  Follow-up with your doctor soon as possible to begin managing your blood pressure and blood sugar. 4.  You were given a dose of Diflucan in the emergency department.  If you are still having symptoms of yeast infection in 7 days, take an additional dose of Diflucan as prescribed.

## 2021-07-06 NOTE — ED Provider Notes (Signed)
[facility] DEPT Provider Note   CSN: [id number] Arrival date & time: 07/06/21  2500     History  Chief Complaint  Patient presents with   Hyperglycemia    Denise Hale is a 51 y.o. female.  HPI Patient reports she has had problems with weight loss over the past several months.  She reports she is also had loss of appetite and intermittent diffuse abdominal discomfort.  Patient thought it was due to Trulicity.  Her doctor had her discontinue Trulicity and she is also had her dose of glipizide decreased to 2.5 mg.  Patient reports that she has been feeling even worse over the past week.  She reports she think she is going to have to go back on insulin.  She reports she now also has a lot of vaginal yeast with itching and burning.  She suspects symptoms are all due to her diabetes getting worse.  She is however concerned for weight loss and loss of appetite with abdominal pain.  She is not specifically noticing pain with eating.  No pain burning urgency with urination no fevers no chills.  Patient reports that she has been compliant with her lisinopril 5 mg but her blood pressures are elevated nonetheless.  She denies chest pain or shortness of breath.  No headache or visual changes different from usual.  She reports she has been seen by the ophthalmologist and told that she was getting some changes but no immediate or acute findings.  Patient is sexually active.    Home Medications Prior to Admission medications   Medication Sig Start Date End Date Taking? Authorizing Provider  fluconazole (DIFLUCAN) 150 MG tablet Take 1 tablet (150 mg total) by mouth daily. If you still have symptoms 1 week after your treatment in the emergency department, take 1 tablet of Diflucan. 07/06/21  Yes Charlesetta Shanks, MD  glipiZIDE (GLUCOTROL) 5 MG tablet Take 1 tablet (5 mg total) by mouth daily before breakfast. 07/06/21  Yes Bettymae Yott, Jeannie Done, MD  lisinopril (ZESTRIL) 10 MG tablet Take  1 tablet (10 mg total) by mouth daily. 07/06/21  Yes Charlesetta Shanks, MD  atorvastatin (LIPITOR) 20 MG tablet Take 1 tablet (20 mg total) by mouth daily. 05/12/21   Gildardo Pounds, NP  Blood Glucose Monitoring Suppl (ONETOUCH VERIO REFLECT) w/Device KIT Check blood glucose level by fingerstick twice per day. Dx: E11.65 11/18/19   Charlott Rakes, MD  citalopram (CELEXA) 20 MG tablet Take 1 tablet (20 mg total) by mouth daily. 05/12/21 08/10/21  Gildardo Pounds, NP  DULoxetine (CYMBALTA) 60 MG capsule Take 1 capsule (60 mg total) by mouth daily. For back pain 05/12/21   Gildardo Pounds, NP  glipiZIDE (GLUCOTROL) 5 MG tablet Take 1 tablet (5 mg total) by mouth 2 (two) times daily before a meal. 05/12/21   Gildardo Pounds, NP  ibuprofen (ADVIL) 800 MG tablet Take 1 tablet (800 mg total) by mouth every 8 (eight) hours as needed for up to 21 doses for fever, headache, mild pain or moderate pain. 04/20/21   Lynden Oxford Scales, PA-C  lansoprazole (PREVACID) 30 MG capsule Take 1 capsule (30 mg total) by mouth daily at 12 noon. 04/20/21 10/17/21  Lynden Oxford Scales, PA-C  linaclotide [facility]) 72 MCG capsule Take 1 capsule (72 mcg total) by mouth daily before breakfast. 02/27/20   Argentina Donovan, PA-C  lisinopril (ZESTRIL) 5 MG tablet TAKE 1 TABLET (5 MG TOTAL) BY MOUTH DAILY. 01/15/21   Argentina Donovan,  PA-C  mupirocin ointment (BACTROBAN) 2 % Apply 1 application topically 2 (two) times daily. 05/13/20   Gildardo Pounds, NP  nystatin-triamcinolone ointment (MYCOLOG) APPLY THIN FILM TO EXTERNAL VAGINAL AREA DAILY X 5 DAYS. DO NOT INSERT IN VAGINA. 12/22/20   Kerin Perna, NP  ondansetron (ZOFRAN-ODT) 4 MG disintegrating tablet Take 1 tablet (4 mg total) by mouth every 8 (eight) hours as needed for nausea or vomiting. 05/12/21   Gildardo Pounds, NP  pregabalin (LYRICA) 50 MG capsule TAKE 1 CAPSULE BY MOUTH TWICE A DAY 06/18/21   Gildardo Pounds, NP  tiZANidine (ZANAFLEX) 4 MG tablet TAKE 1 TABLET BY  MOUTH EVERY 6 HOURS AS NEEDED FOR MUSCLE SPASMS. 09/14/20   Charlott Rakes, MD  traMADol (ULTRAM) 50 MG tablet Take 1 tablet (50 mg total) by mouth 3 (three) times daily as needed. 12/21/20   Aundra Dubin, PA-C  TRUEplus Lancets 28G MISC Use as instructed. Check blood glucose level by fingerstick twice per day.   E11.65 04/01/19   Gildardo Pounds, NP      Allergies    Ciprofloxacin and Metformin and related    Review of Systems   Review of Systems 10 systems reviewed negative except as per HPI Physical Exam Updated Vital Signs BP (!) 154/99   Pulse 69   Temp 98.9 F (37.2 C) (Oral)   Resp 20   SpO2 100%  Physical Exam Constitutional:      Comments: Alert nontoxic clear mental status.  HENT:     Head: Normocephalic and atraumatic.     Mouth/Throat:     Mouth: Mucous membranes are moist.     Pharynx: Oropharynx is clear.  Eyes:     Extraocular Movements: Extraocular movements intact.  Cardiovascular:     Rate and Rhythm: Normal rate and regular rhythm.  Pulmonary:     Effort: Pulmonary effort is normal.     Breath sounds: Normal breath sounds.  Abdominal:     Comments: Abdomen without guarding.  Moderate diffuse central and upper abdominal pain to palpation.  No inguinal mass or fullness.  Genitourinary:    Comments: External genitalia mild erosions of the mucosa at the labia minora.  Moderate amount of white thickish discharge in the vaginal vault.  Cervix normal in appearance without erythema blood or discharge.  Bimanual exam nontender. Musculoskeletal:        General: No swelling or tenderness. Normal range of motion.     Right lower leg: No edema.     Left lower leg: No edema.     Comments: No lower extremity edema or wounds of the feet or lower legs.  Skin:    General: Skin is warm and dry.  Neurological:     General: No focal deficit present.     Mental Status: She is oriented to person, place, and time.     Motor: No weakness.     Coordination: Coordination  normal.  Psychiatric:        Mood and Affect: Mood normal.     ED Results / Procedures / Treatments   Labs (all labs ordered are listed, but only abnormal results are displayed) Labs Reviewed  WET PREP, GENITAL - Abnormal; Notable for the following components:      Result Value   WBC, Wet Prep HPF POC >=10 (*)    All other components within normal limits  CBC WITH DIFFERENTIAL/PLATELET - Abnormal; Notable for the following components:   Hemoglobin 11.8 (*)  HCT 35.6 (*)    All other components within normal limits  COMPREHENSIVE METABOLIC PANEL - Abnormal; Notable for the following components:   Potassium 3.4 (*)    Glucose, Bld 406 (*)    ALT 67 (*)    Alkaline Phosphatase 221 (*)    All other components within normal limits  URINALYSIS, ROUTINE W REFLEX MICROSCOPIC - Abnormal; Notable for the following components:   Specific Gravity, Urine 1.038 (*)    Glucose, UA >=500 (*)    Protein, ur 100 (*)    All other components within normal limits  HEMOGLOBIN A1C - Abnormal; Notable for the following components:   Hgb A1c MFr Bld 9.4 (*)    All other components within normal limits  BLOOD GAS, VENOUS - Abnormal; Notable for the following components:   pO2, Ven <31 (*)    Bicarbonate 34.1 (*)    Acid-Base Excess 7.5 (*)    All other components within normal limits  CBG MONITORING, ED - Abnormal; Notable for the following components:   Glucose-Capillary 421 (*)    All other components within normal limits  CBG MONITORING, ED - Abnormal; Notable for the following components:   Glucose-Capillary 501 (*)    All other components within normal limits  CBG MONITORING, ED - Abnormal; Notable for the following components:   Glucose-Capillary 395 (*)    All other components within normal limits  RPR  HIV ANTIBODY (ROUTINE TESTING W REFLEX)  GC/CHLAMYDIA PROBE AMP ([place]) NOT AT [facility]    EKG None  Radiology CT CHEST ABDOMEN PELVIS W CONTRAST  Result Date:  07/06/2021 CLINICAL DATA:  51 year old female with unintentional weight loss. Malaise for 1 week. EXAM: CT CHEST, ABDOMEN, AND PELVIS WITH CONTRAST TECHNIQUE: Multidetector CT imaging of the chest, abdomen and pelvis was performed following the standard protocol during bolus administration of intravenous contrast. RADIATION DOSE REDUCTION: This exam was performed according to the departmental dose-optimization program which includes automated exposure control, adjustment of the mA and/or kV according to patient size and/or use of iterative reconstruction technique. CONTRAST:  154m OMNIPAQUE IOHEXOL 300 MG/ML  SOLN COMPARISON:  Lumbar radiographs 08/19/2020. FINDINGS: CT CHEST FINDINGS Cardiovascular: 4 vessel arch configuration, the left vertebral artery arises directly from the arch (normal variant). Calcified coronary artery atherosclerosis on series 2, image 31. No cardiomegaly or pericardial effusion. Negative thoracic aorta. Visible pulmonary artery branches are enhancing and appear to be patent (note artifactual appearance of the right lower lobe branch series 2, image 32 which is normal on thin slice series 8 images. Mediastinum/Nodes: Negative. No mediastinal mass or lymphadenopathy. Lungs/Pleura: Major airways are patent. Lung volumes appear normal. Both lungs appear clear. Minimal subpleural scarring. Musculoskeletal: Thoracic disc and endplate degeneration. No acute osseous abnormality identified. CT ABDOMEN PELVIS FINDINGS Hepatobiliary: Absent gallbladder.  Negative liver. Pancreas: Negative. Spleen: Negative. Adrenals/Urinary Tract: Normal adrenal glands. Symmetric renal enhancement and contrast excretion with no nephrolithiasis or pararenal inflammation. Proximal ureters appear normal. Numerous pelvic phleboliths. Urinary bladder is distended (445 mL) but otherwise unremarkable. Stomach/Bowel: Gas distended but otherwise negative rectum. Decompressed descending and sigmoid colon. Retained stool in the  transverse and right colon. Transverse segment is redundant. Diminutive or absent appendix (possibly on series 2, image 93). No large bowel inflammation. Fluid containing but nondilated distal small bowel loops including the terminal ileum. Small volume of fluid in the stomach which is relatively decompressed. Decompressed duodenum and proximal jejunum. No free air, free fluid, or mesenteric inflammation identified. Vascular/Lymphatic: Major arterial structures in  the abdomen and pelvis appear patent and normal. No calcified atherosclerosis or lymphadenopathy identified. Reproductive: Within normal limits. Other: No pelvic free fluid.  Numerous pelvic phleboliths. Musculoskeletal: Moderate lumbar facet arthropathy at L4-L5. Otherwise mild for age lumbar spine degeneration. No acute osseous abnormality identified. IMPRESSION: 1. No acute or inflammatory process identified in the chest, abdomen, or pelvis. 2. Calcified coronary artery atherosclerosis. 3. Distended urinary bladder (445 mL).  No obstructive uropathy. Electronically Signed   By: Genevie Ann M.D.   On: 07/06/2021 10:53    Procedures Procedures    Medications Ordered in ED Medications  lactated ringers bolus 1,000 mL (0 mLs Intravenous Stopped 07/06/21 1314)  insulin aspart (novoLOG) injection 10 Units (10 Units Subcutaneous Given 07/06/21 1017)  iohexol (OMNIPAQUE) 300 MG/ML solution 100 mL (100 mLs Intravenous Contrast Given 07/06/21 1003)  lisinopril (ZESTRIL) tablet 10 mg (10 mg Oral Given 07/06/21 1231)  fluconazole (DIFLUCAN) tablet 150 mg (150 mg Oral Given 07/06/21 1313)    ED Course/ Medical Decision Making/ A&P                           Medical Decision Making Amount and/or Complexity of Data Reviewed Labs: ordered. Radiology: ordered.  Risk Prescription drug management.   Patient presents with multiple symptoms.  Main concern is weight loss with loss of appetite and abdominal pain.  Patient also has poorly controlled diabetes at  this time.  She previously was on Trulicity as well as glipizide but now has been transition to only low-dose of glipizide.  We will proceed with broad diagnostic evaluation including venous gas, A1c and CT abdomen pelvis to rule out any intra-abdominal cause for weight loss such as old occult malignancy.  CT scan without acute findings.  A1c elevated consistent with persistently poorly controlled diabetes.  Pelvic exam shows changes consistent with yeast vaginitis but no purulent drainage, friability or bimanual tenderness.  At this time no signs of PID.  Will treat with oral Diflucan.  Patient's blood sugars responded to rehydration and regular insulin.  At this time will increase her glipizide dose to 5 mg daily and provide dietary instructions for carbohydrate management.  Patient is working closely with her physician and anticipates transitioning back to insulin.  Patient's blood pressures are elevated in the emergency department.  She reports compliance with medications.  She is on lisinopril 5 mg a day will increase to 10 mg daily with home monitoring.  At this time patient is stable for discharge.  No emergent findings on CT chest abdomen pelvis, no signs of hyperosmolar state.  Patient has clear mental status and is nontoxic.  Medication management recommendations have been made and follow-up plan reviewed.        Final Clinical Impression(s) / ED Diagnoses Final diagnoses:  Essential hypertension  Yeast vaginitis  Unintentional weight loss  Loss of appetite  Generalized abdominal pain  Uncontrolled type 2 diabetes mellitus with hyperglycemia ([place])    Rx / DC Orders ED Discharge Orders          Ordered    lisinopril (ZESTRIL) 10 MG tablet  Daily        07/06/21 1311    glipiZIDE (GLUCOTROL) 5 MG tablet  Daily before breakfast        07/06/21 1311    fluconazole (DIFLUCAN) 150 MG tablet  Daily        07/06/21 1311  Charlesetta Shanks, MD 07/06/21  1335

## 2021-07-06 NOTE — ED Notes (Signed)
Traunda NT to assist EDP with pelvic exam

## 2021-07-07 ENCOUNTER — Encounter: Payer: Self-pay | Admitting: Nurse Practitioner

## 2021-07-07 ENCOUNTER — Telehealth: Payer: Self-pay | Admitting: Nurse Practitioner

## 2021-07-07 LAB — GC/CHLAMYDIA PROBE AMP (~~LOC~~) NOT AT ARMC
Chlamydia: NEGATIVE
Comment: NEGATIVE
Comment: NORMAL
Neisseria Gonorrhea: NEGATIVE

## 2021-07-07 NOTE — Telephone Encounter (Signed)
Pt has been seen in ED and has follow up appointment with PCP

## 2021-07-07 NOTE — Telephone Encounter (Signed)
Copied from CRM [id number]. Topic: General - Inquiry >> Jul 05, 2021  4:18 PM Lyman Speller wrote: Reason for CRM: Pt called to see if her PCP reached out of left a message about the Pts triage note / please advise

## 2021-07-07 NOTE — Telephone Encounter (Signed)
Patient was seen in ED and given medication to address yeast.

## 2021-07-19 ENCOUNTER — Other Ambulatory Visit: Payer: Self-pay | Admitting: Nurse Practitioner

## 2021-07-19 ENCOUNTER — Other Ambulatory Visit: Payer: Self-pay | Admitting: Internal Medicine

## 2021-07-19 DIAGNOSIS — R110 Nausea: Secondary | ICD-10-CM

## 2021-07-19 DIAGNOSIS — N761 Subacute and chronic vaginitis: Secondary | ICD-10-CM

## 2021-08-02 ENCOUNTER — Ambulatory Visit: Payer: 59 | Admitting: Physician Assistant

## 2021-08-17 ENCOUNTER — Other Ambulatory Visit: Payer: Self-pay | Admitting: Nurse Practitioner

## 2021-08-17 DIAGNOSIS — F32A Depression, unspecified: Secondary | ICD-10-CM

## 2021-08-17 NOTE — Telephone Encounter (Signed)
Requested medications are due for refill today.  Unsure  Requested medications are on the active medications list.  yes  Last refill. 05/12/2021 #90 0 refills  Future visit scheduled.   yes  Notes to clinic.  Rx written to expire 08/10/2021 - Rx is expired.    Requested Prescriptions  Pending Prescriptions Disp Refills   citalopram (CELEXA) 20 MG tablet [Pharmacy Med Name: CITALOPRAM HBR 20 MG TABLET] 90 tablet 0    Sig: TAKE 1 TABLET BY MOUTH EVERY DAY     Psychiatry:  Antidepressants - SSRI Passed - 08/17/2021  2:23 AM      Passed - Valid encounter within last 6 months    Recent Outpatient Visits           3 months ago Type 2 diabetes mellitus with diabetic peripheral angiopathy without gangrene, without long-term current use of insulin [facility])   [place] [facility] [place], [place] W, NP   6 months ago Type 2 diabetes mellitus with diabetic polyneuropathy, with long-term current use of insulin [facility])   [place] [facility] [address], Shea Stakes, NP   8 months ago Anxiety and depression   [facility] [place], Shea Stakes, NP   9 months ago Peripheral neuropathic pain   [facility] [place], [place] W, NP   11 months ago Type 2 diabetes mellitus with diabetic polyneuropathy, with long-term current use of insulin [facility])   [place] [facility] [place], Shea Stakes, NP       Future Appointments             In 1 month Claiborne Rigg, NP [facility] [facility]

## 2021-08-30 ENCOUNTER — Ambulatory Visit: Payer: 59 | Admitting: Physician Assistant

## 2021-09-13 ENCOUNTER — Encounter (INDEPENDENT_AMBULATORY_CARE_PROVIDER_SITE_OTHER): Payer: 59 | Admitting: Ophthalmology

## 2021-09-29 ENCOUNTER — Ambulatory Visit: Payer: Medicaid Other | Admitting: Nurse Practitioner

## 2021-10-26 ENCOUNTER — Encounter (INDEPENDENT_AMBULATORY_CARE_PROVIDER_SITE_OTHER): Payer: 59 | Admitting: Ophthalmology

## 2021-11-17 ENCOUNTER — Ambulatory Visit: Payer: Self-pay | Admitting: Family Medicine

## 2021-11-20 ENCOUNTER — Other Ambulatory Visit: Payer: Self-pay | Admitting: Family Medicine

## 2021-11-20 DIAGNOSIS — F32A Depression, unspecified: Secondary | ICD-10-CM

## 2021-11-22 NOTE — Telephone Encounter (Signed)
Requested Prescriptions  Pending Prescriptions Disp Refills   citalopram (CELEXA) 20 MG tablet [Pharmacy Med Name: CITALOPRAM HBR 20 MG TABLET] 90 tablet 0    Sig: TAKE 1 TABLET BY MOUTH EVERY DAY     Psychiatry:  Antidepressants - SSRI Failed - 11/20/2021  2:29 AM      Failed - Valid encounter within last 6 months    Recent Outpatient Visits           6 months ago Type 2 diabetes mellitus with diabetic peripheral angiopathy without gangrene, without long-term current use of insulin [facility])   [place] [facility] [place], [place] W, NP   9 months ago Type 2 diabetes mellitus with diabetic polyneuropathy, with long-term current use of insulin [facility])   [place] [facility] [place], Shea Stakes, NP   11 months ago Anxiety and depression   [facility] [place], Shea Stakes, NP   1 year ago Peripheral neuropathic pain   [facility] [place], [place] W, NP   1 year ago Type 2 diabetes mellitus with diabetic polyneuropathy, with long-term current use of insulin [facility])   [place] [facility] [place], Shea Stakes, NP       Future Appointments             In 1 month Claiborne Rigg, NP [facility] [employer] [facility]

## 2021-11-24 ENCOUNTER — Other Ambulatory Visit: Payer: Self-pay | Admitting: Nurse Practitioner

## 2021-11-24 DIAGNOSIS — M792 Neuralgia and neuritis, unspecified: Secondary | ICD-10-CM

## 2021-11-26 ENCOUNTER — Other Ambulatory Visit: Payer: Self-pay | Admitting: Nurse Practitioner

## 2021-11-26 DIAGNOSIS — G8929 Other chronic pain: Secondary | ICD-10-CM

## 2021-11-26 NOTE — Telephone Encounter (Signed)
Medication Refill - Medication: gabapentin (NEURONTIN) 300 MG capsule   ibuprofen (ADVIL) 800 MG tablet   tiZANidine (ZANAFLEX) 4 MG tablet     Has the patient contacted their pharmacy? Yes.   (Agent: If no, request that the patient contact the pharmacy for the refill. If patient does not wish to contact the pharmacy document the reason why and proceed with request.) (Agent: If yes, when and what did the pharmacy advise?)  Preferred Pharmacy (with phone number or street name): [id number] - [place], [place] - [address]  Has the patient been seen for an appointment in the last year OR does the patient have an upcoming appointment? Yes.    Agent: Please be advised that RX refills may take up to 3 business days. We ask that you follow-up with your pharmacy.

## 2021-11-26 NOTE — Telephone Encounter (Signed)
Requested medication (s) are due for refill today:   Provider to review  Requested medication (s) are on the active medication list:   Yes  Future visit scheduled:   Yes   Last ordered: 06/18/2021 #60, 0 refills  Non delegated refill reason returned   Requested Prescriptions  Pending Prescriptions Disp Refills   pregabalin (LYRICA) 50 MG capsule [Pharmacy Med Name: PREGABALIN 50 MG CAPSULE] 60 capsule 0    Sig: TAKE 1 CAPSULE BY MOUTH TWICE A DAY     Not Delegated - Neurology:  Anticonvulsants - Controlled - pregabalin Failed - 11/24/2021  7:00 PM      Failed - This refill cannot be delegated      Passed - Cr in normal range and within 360 days    Creatinine, Ser  Date Value Ref Range Status  07/06/2021 0.58 0.44 - 1.00 mg/dL Final         Passed - Completed PHQ-2 or PHQ-9 in the last 360 days      Passed - Valid encounter within last 12 months    Recent Outpatient Visits           6 months ago Type 2 diabetes mellitus with diabetic peripheral angiopathy without gangrene, without long-term current use of insulin (HCC)   [place] [facility] [place], [place] W, NP   9 months ago Type 2 diabetes mellitus with diabetic polyneuropathy, with long-term current use of insulin (HCC)   [place] [facility] [place], Shea Stakes, NP   11 months ago Anxiety and depression   [facility] [place], Shea Stakes, NP   1 year ago Peripheral neuropathic pain   [place] [facility] [place], [place] W, NP   1 year ago Type 2 diabetes mellitus with diabetic polyneuropathy, with long-term current use of insulin [facility])   [place] [facility] [place], Shea Stakes, NP       Future Appointments             In 1 month Claiborne Rigg, NP [facility] [employer] [facility]

## 2021-11-26 NOTE — Telephone Encounter (Signed)
Requested medication (s) are due for refill today: Yes  Requested medication (s) are on the active medication list: Yes  Last refill:  04/20/21 Ibuprofen; 09/14/20 for Tizanidine  Future visit scheduled: Yes  Notes to clinic:  Unable to refill per protocol, cannot delegate. Unsure if provider wants another refill on Ibuprofen since last Rx was for 21 doses, routing for approval. Requesting Gabapentin not on medication list.      Requested Prescriptions  Pending Prescriptions Disp Refills   ibuprofen (ADVIL) 800 MG tablet 21 tablet 0    Sig: Take 1 tablet (800 mg total) by mouth every 8 (eight) hours as needed for up to 21 doses for fever, headache, mild pain or moderate pain.     Analgesics:  NSAIDS Failed - 11/26/2021 12:08 PM      Failed - Manual Review: Labs are only required if the patient has taken medication for more than 8 weeks.      Failed - HGB in normal range and within 360 days    Hemoglobin  Date Value Ref Range Status  07/06/2021 11.8 (L) 12.0 - 15.0 g/dL Final  02/10/2021 12.8 11.1 - 15.9 g/dL Final         Failed - HCT in normal range and within 360 days    HCT  Date Value Ref Range Status  07/06/2021 35.6 (L) 36.0 - 46.0 % Final   Hematocrit  Date Value Ref Range Status  02/10/2021 38.7 34.0 - 46.6 % Final         Passed - Cr in normal range and within 360 days    Creatinine, Ser  Date Value Ref Range Status  07/06/2021 0.58 0.44 - 1.00 mg/dL Final         Passed - PLT in normal range and within 360 days    Platelets  Date Value Ref Range Status  07/06/2021 318 150 - 400 K/uL Final  02/10/2021 354 150 - 450 x10E3/uL Final         Passed - eGFR is 30 or above and within 360 days    GFR calc Af Amer  Date Value Ref Range Status  02/27/2020 120 >59 mL/min/1.73 Final    Comment:    **In accordance with recommendations from the NKF-ASN Task force,**   [facility] is in the process of updating its eGFR calculation to the   2021 CKD-EPI creatinine  equation that estimates kidney function   without a race variable.    GFR, Estimated  Date Value Ref Range Status  07/06/2021 >60 >60 mL/min Final    Comment:    (NOTE) Calculated using the CKD-EPI Creatinine Equation (2021)    eGFR  Date Value Ref Range Status  05/12/2021 112 >59 mL/min/1.73 Final         Passed - Patient is not pregnant      Passed - Valid encounter within last 12 months    Recent Outpatient Visits           6 months ago Type 2 diabetes mellitus with diabetic peripheral angiopathy without gangrene, without long-term current use of insulin (HCC)   [facility] [place], Zelda W, NP   9 months ago Type 2 diabetes mellitus with diabetic polyneuropathy, with long-term current use of insulin (HCC)   [place] [place], Zelda W, NP   11 months ago Anxiety and depression   [place] [facility] [place], Zelda W, NP   1 year ago   Peripheral neuropathic pain   [place], [place] W, NP   1 year ago Type 2 diabetes mellitus with diabetic polyneuropathy, with long-term current use of insulin [facility])   [place], Vernia Buff, NP       Future Appointments             In 1 month Gildardo Pounds, NP [place]             tiZANidine (ZANAFLEX) 4 MG tablet 60 tablet 1    Sig: Take 1 tablet (4 mg total) by mouth every 6 (six) hours as needed for muscle spasms.     Not Delegated - Cardiovascular:  Alpha-2 Agonists - tizanidine Failed - 11/26/2021 12:08 PM      Failed - This refill cannot be delegated      Failed - Valid encounter within last 6 months    Recent Outpatient Visits           6 months ago Type 2 diabetes mellitus with diabetic peripheral angiopathy without gangrene, without long-term current use of insulin [facility])   [place],  [place] W, NP   9 months ago Type 2 diabetes mellitus with diabetic polyneuropathy, with long-term current use of insulin [facility])   [place], Vernia Buff, NP   11 months ago Anxiety and depression   [place], Vernia Buff, NP   1 year ago Peripheral neuropathic pain   [place], [place] W, NP   1 year ago Type 2 diabetes mellitus with diabetic polyneuropathy, with long-term current use of insulin [facility])   [place], Vernia Buff, NP       Future Appointments             In 1 month Gildardo Pounds, NP [place]

## 2021-11-30 MED ORDER — IBUPROFEN 800 MG PO TABS: 800.0000 mg | ORAL_TABLET | Freq: Three times a day (TID) | ORAL | 0 refills | Status: DC | PRN

## 2021-11-30 MED ORDER — TIZANIDINE HCL 4 MG PO TABS: 4.0000 mg | ORAL_TABLET | Freq: Four times a day (QID) | ORAL | 0 refills | Status: DC | PRN

## 2021-12-07 ENCOUNTER — Other Ambulatory Visit: Payer: Self-pay | Admitting: Family Medicine

## 2021-12-07 DIAGNOSIS — G8929 Other chronic pain: Secondary | ICD-10-CM

## 2021-12-07 NOTE — Telephone Encounter (Signed)
Requested medication (s) are due for refill today: no  Requested medication (s) are on the active medication list: yes  Last refill:  11/30/21 #60  Future visit scheduled: yes  Notes to clinic:  90 day refill requested- med not delegated to NT to refill   Requested Prescriptions  Pending Prescriptions Disp Refills   tiZANidine (ZANAFLEX) 4 MG tablet [Pharmacy Med Name: TIZANIDINE HCL 4 MG TABLET] 360 tablet 1    Sig: TAKE 1 TABLET BY MOUTH EVERY 6 HOURS AS NEEDED FOR MUSCLE SPASMS.     Not Delegated - Cardiovascular:  Alpha-2 Agonists - tizanidine Failed - 12/07/2021  2:33 PM      Failed - This refill cannot be delegated      Failed - Valid encounter within last 6 months    Recent Outpatient Visits           6 months ago Type 2 diabetes mellitus with diabetic peripheral angiopathy without gangrene, without long-term current use of insulin [facility])   [place] [facility] [place], [place] W, NP   10 months ago Type 2 diabetes mellitus with diabetic polyneuropathy, with long-term current use of insulin [facility])   [place] [facility] [place], Shea Stakes, NP   11 months ago Anxiety and depression   [facility] [place], Shea Stakes, NP   1 year ago Peripheral neuropathic pain   [place] [facility] [place], [place] W, NP   1 year ago Type 2 diabetes mellitus with diabetic polyneuropathy, with long-term current use of insulin [facility])   [place] [facility] [place], Shea Stakes, NP       Future Appointments             In 1 month Claiborne Rigg, NP [facility]

## 2022-01-05 ENCOUNTER — Ambulatory Visit: Payer: Commercial Managed Care - HMO | Attending: Family Medicine | Admitting: Nurse Practitioner

## 2022-01-05 ENCOUNTER — Encounter: Payer: Self-pay | Admitting: Nurse Practitioner

## 2022-01-05 ENCOUNTER — Other Ambulatory Visit: Payer: Self-pay | Admitting: Nurse Practitioner

## 2022-01-05 VITALS — BP 121/76 | HR 68 | Ht 68.0 in | Wt 128.6 lb

## 2022-01-05 DIAGNOSIS — F32A Depression, unspecified: Secondary | ICD-10-CM

## 2022-01-05 DIAGNOSIS — N761 Subacute and chronic vaginitis: Secondary | ICD-10-CM

## 2022-01-05 DIAGNOSIS — K219 Gastro-esophageal reflux disease without esophagitis: Secondary | ICD-10-CM

## 2022-01-05 DIAGNOSIS — R748 Abnormal levels of other serum enzymes: Secondary | ICD-10-CM

## 2022-01-05 DIAGNOSIS — F419 Anxiety disorder, unspecified: Secondary | ICD-10-CM

## 2022-01-05 DIAGNOSIS — R110 Nausea: Secondary | ICD-10-CM

## 2022-01-05 DIAGNOSIS — I10 Essential (primary) hypertension: Secondary | ICD-10-CM | POA: Diagnosis not present

## 2022-01-05 DIAGNOSIS — E1151 Type 2 diabetes mellitus with diabetic peripheral angiopathy without gangrene: Secondary | ICD-10-CM

## 2022-01-05 DIAGNOSIS — D649 Anemia, unspecified: Secondary | ICD-10-CM

## 2022-01-05 DIAGNOSIS — Z1211 Encounter for screening for malignant neoplasm of colon: Secondary | ICD-10-CM

## 2022-01-05 DIAGNOSIS — G8929 Other chronic pain: Secondary | ICD-10-CM

## 2022-01-05 DIAGNOSIS — M5442 Lumbago with sciatica, left side: Secondary | ICD-10-CM

## 2022-01-05 DIAGNOSIS — K5909 Other constipation: Secondary | ICD-10-CM

## 2022-01-05 DIAGNOSIS — R634 Abnormal weight loss: Secondary | ICD-10-CM

## 2022-01-05 LAB — POCT GLYCOSYLATED HEMOGLOBIN (HGB A1C): HbA1c POC (<> result, manual entry): >15 % (ref 4.0–5.6)

## 2022-01-05 LAB — GLUCOSE, POCT (MANUAL RESULT ENTRY): POC Glucose: 118 mg/dl — AB (ref 70–99)

## 2022-01-05 MED ORDER — LINACLOTIDE 72 MCG PO CAPS: 72.0000 ug | ORAL_CAPSULE | Freq: Every day | ORAL | 2 refills | Status: DC

## 2022-01-05 MED ORDER — FREESTYLE LIBRE 2 READER DEVI: 0 refills | Status: DC

## 2022-01-05 MED ORDER — LANTUS SOLOSTAR 100 UNIT/ML ~~LOC~~ SOPN: 40.0000 [IU] | PEN_INJECTOR | Freq: Every day | SUBCUTANEOUS | 99 refills | Status: DC

## 2022-01-05 MED ORDER — BASAGLAR KWIKPEN 100 UNIT/ML ~~LOC~~ SOPN: 40.0000 [IU] | PEN_INJECTOR | Freq: Every day | SUBCUTANEOUS | 1 refills | Status: DC

## 2022-01-05 MED ORDER — GABAPENTIN 300 MG PO CAPS: 300.0000 mg | ORAL_CAPSULE | Freq: Three times a day (TID) | ORAL | 1 refills | Status: DC

## 2022-01-05 MED ORDER — FLUCONAZOLE 150 MG PO TABS: 150.0000 mg | ORAL_TABLET | Freq: Every day | ORAL | 0 refills | Status: DC

## 2022-01-05 MED ORDER — FREESTYLE LIBRE 2 SENSOR MISC: 6 refills | Status: DC

## 2022-01-05 MED ORDER — ONDANSETRON 4 MG PO TBDP: 4.0000 mg | ORAL_TABLET | Freq: Three times a day (TID) | ORAL | 1 refills | Status: DC | PRN

## 2022-01-05 MED ORDER — CITALOPRAM HYDROBROMIDE 20 MG PO TABS: 20.0000 mg | ORAL_TABLET | Freq: Every day | ORAL | 1 refills | Status: DC

## 2022-01-05 MED ORDER — AMLODIPINE BESYLATE 5 MG PO TABS: 5.0000 mg | ORAL_TABLET | Freq: Every day | ORAL | 1 refills | Status: DC

## 2022-01-05 MED ORDER — LANSOPRAZOLE 30 MG PO CPDR: 30.0000 mg | DELAYED_RELEASE_CAPSULE | Freq: Every day | ORAL | 1 refills | Status: DC

## 2022-01-05 MED ORDER — TIZANIDINE HCL 4 MG PO TABS: 4.0000 mg | ORAL_TABLET | Freq: Four times a day (QID) | ORAL | 3 refills | Status: DC | PRN

## 2022-01-05 MED ORDER — INSULIN LISPRO (1 UNIT DIAL) 100 UNIT/ML (KWIKPEN): 3.0000 [IU] | PEN_INJECTOR | Freq: Three times a day (TID) | SUBCUTANEOUS | 11 refills | Status: DC

## 2022-01-05 MED ORDER — GLIPIZIDE ER 10 MG PO TB24: 10.0000 mg | ORAL_TABLET | Freq: Every day | ORAL | 1 refills | Status: DC

## 2022-01-05 NOTE — Patient Instructions (Addendum)
[facility] [phone] option 1    [place], [place] [address] Phone [phone] Fax [phone]  Podiatry  [place] [facility]  [phone] Open ? Closes 5?PM

## 2022-01-05 NOTE — Progress Notes (Signed)
Glu numbers high. Cramping of legs.  Referral to endo

## 2022-01-05 NOTE — Progress Notes (Signed)
Assessment & Plan:  Denise Hale was seen today for diabetes.  Diagnoses and all orders for this visit:  Type 2 diabetes mellitus with diabetic peripheral angiopathy without gangrene, without long-term current use of insulin (HCC) -     POCT glycosylated hemoglobin (Hb A1C) -     POCT glucose (manual entry) -     CMP14+EGFR -     Ambulatory referral to Endocrinology -     glipiZIDE (GLIPIZIDE XL) 10 MG 24 hr tablet; Take 1 tablet (10 mg total) by mouth daily with breakfast. -     insulin glargine (LANTUS SOLOSTAR) 100 UNIT/ML Solostar Pen; Inject 40 Units into the skin at bedtime. -     gabapentin (NEURONTIN) 300 MG capsule; Take 1 capsule (300 mg total) by mouth 3 (three) times daily. -     insulin lispro (HUMALOG KWIKPEN) 100 UNIT/ML KwikPen; Inject 3 Units into the skin 3 (three) times daily with meals. -     Continuous Blood Gluc Receiver (FREESTYLE LIBRE 2 READER) DEVI; Check blood glucose level 5 times per day. -     Continuous Blood Gluc Sensor (FREESTYLE LIBRE 2 SENSOR) MISC; Check blood glucose level 5 times per day. Continue blood sugar control as discussed in office today, low carbohydrate diet, and regular physical exercise as tolerated, 150 minutes per week (30 min each day, 5 days per week, or 50 min 3 days per week). Keep blood sugar logs with fasting goal of 90-130 mg/dl, post prandial (after you eat) less than 180.  For Hypoglycemia: BS <60 and Hyperglycemia BS >400; contact the clinic ASAP. Annual eye exams and foot exams are recommended.    Elevated liver enzymes -     CMP14+EGFR  Anemia, unspecified type -     CBC with Differential -     Ambulatory referral to Gastroenterology  Unintentional weight loss -     Cancer Antigen 19-9 -     CA 125 -     Ambulatory referral to Gastroenterology  Colon cancer screening -     Ambulatory referral to Gastroenterology  Chronic bilateral low back pain with left-sided sciatica -     tiZANidine (ZANAFLEX) 4 MG tablet; Take 1  tablet (4 mg total) by mouth every 6 (six) hours as needed for muscle spasms.  Anxiety and depression -     citalopram (CELEXA) 20 MG tablet; Take 1 tablet (20 mg total) by mouth daily.  Subacute and chronic vaginitis -     fluconazole (DIFLUCAN) 150 MG tablet; Take 1 tablet (150 mg total) by mouth daily. Chronic yeast vaginitis  Chronic nausea -     ondansetron (ZOFRAN-ODT) 4 MG disintegrating tablet; Take 1 tablet (4 mg total) by mouth every 8 (eight) hours as needed for nausea or vomiting.  Chronic constipation -     linaclotide (LINZESS) 72 MCG capsule; Take 1 capsule (72 mcg total) by mouth daily before breakfast. For constipation  GERD without esophagitis -     lansoprazole (PREVACID) 30 MG capsule; Take 1 capsule (30 mg total) by mouth daily at 12 noon. INSTRUCTIONS: Avoid GERD Triggers: acidic, spicy or fried foods, caffeine, coffee, sodas,  alcohol and chocolate.    Primary hypertension -     amLODipine (NORVASC) 5 MG tablet; Take 1 tablet (5 mg total) by mouth daily. Continue all antihypertensives as prescribed.  Reminded to bring in blood pressure log for follow  up appointment.  RECOMMENDATIONS: DASH/Mediterranean Diets are healthier choices for HTN.      Patient  has been counseled on age-appropriate routine health concerns for screening and prevention. These are reviewed and up-to-date. Referrals have been placed accordingly. Immunizations are up-to-date or declined.    Subjective:   Chief Complaint  Patient presents with   Diabetes   Diabetes Hypoglycemia symptoms include nervousness/anxiousness. Pertinent negatives for hypoglycemia include no dizziness, headaches or seizures. Associated symptoms include weight loss. Pertinent negatives for diabetes include no blurred vision and no chest pain.   Denise Hale 52 y.o. female presents to office today for follow up to DM and HTN   She has a past medical history of Anxiety, Asthma, DM2, Diverticulitis, GERD, peripheral  neuropathy, HTN, and Severe nonproliferative diabetic retinopathy.     She was previously seeing another primary care provider and states due to insurance issues she could no longer see her for medical care.    DM 2 States blood glucose levels have been high 500s. However she recently lost her glucometer. Needs replacement.  She is taking glipizide XR 10 mg daily. We will need to resume lantus and novolog today. She can not tolerate metformin due to GI Upset. She was also prescribed trulicity in the past however due to ongoing unintentional weight loss this was stopped.  Diabetes is poorly controlled and now Greater than 15 from 9.4. Associated symptoms include peripheral neuropathy for which she takes gabapentin. Lyrica was stopped due to worsening visual changes.  She is overdue for follow up with podiatry. She also takes cymbalta for chronic back pain.  Lab Results  Component Value Date   HGBA1C >15 01/05/2022    Lab Results  Component Value Date   HGBA1C 9.4 (H) 07/06/2021   LDL at goal. Lab Results  Component Value Date   LDLCALC 57 02/10/2021  She is normotensive today. She was taken off lisinopril and started on amlodipine by another provider.  BP Readings from Last 3 Encounters:  01/05/22 121/76  07/06/21 (!) 154/99  05/12/21 128/83    GI She endorses worsening nausea with occasional vomiting over the past several months. Associated symptoms include periumbilical pain. She does have a history of diverticulitis and constipation. WBC normal. She denies fever, melena or hematochezia. She does smoke marijuana daily. She has never had a colonoscopy however her recent FOBT was negative (05-13-2021).Abdominal pain is colicky in nature. Occurring daily and unrelated to dietary intake. She has a small umbilical hernia and believes this is contributing to her pain. I have referred her to [facility] for evaluation  Review of Systems  Constitutional:  Positive for weight loss. Negative for fever and  malaise/fatigue.  HENT: Negative.  Negative for nosebleeds.   Eyes: Negative.  Negative for blurred vision, double vision and photophobia.  Respiratory: Negative.  Negative for cough and shortness of breath.   Cardiovascular: Negative.  Negative for chest pain, palpitations and leg swelling.  Gastrointestinal:  Positive for abdominal pain, constipation, heartburn and nausea. Negative for blood in stool, diarrhea, melena and vomiting.  Genitourinary:        Chronic vaginitis  Musculoskeletal:  Positive for back pain, joint pain and myalgias.  Neurological: Negative.  Negative for dizziness, focal weakness, seizures and headaches.  Psychiatric/Behavioral:  Positive for depression. Negative for suicidal ideas. The patient is nervous/anxious.     Past Medical History:  Diagnosis Date   Anxiety    Asthma    Diverticulitis    DM (diabetes mellitus), type 2 with ophthalmic complications (HCC)    GERD (gastroesophageal reflux disease)    Severe nonproliferative diabetic retinopathy (  [facility])     Past Surgical History:  Procedure Laterality Date   CARPAL TUNNEL RELEASE Right 04/08/2020   Procedure: RIGHT CARPAL TUNNEL RELEASE;  Surgeon: Leandrew Koyanagi, MD;  Location: [facility];  Service: Orthopedics;  Laterality: Right;   CHOLECYSTECTOMY     COLON SURGERY      Family History  Problem Relation Age of Onset   Diabetes Mother    Thyroid disease Mother    Heart disease Father    Breast cancer Paternal Aunt    Breast cancer Paternal Aunt     Social History Reviewed with no changes to be made today.   Outpatient Medications Prior to Visit  Medication Sig Dispense Refill   atorvastatin (LIPITOR) 20 MG tablet Take 1 tablet (20 mg total) by mouth daily. 90 tablet 3   DULoxetine (CYMBALTA) 60 MG capsule Take 1 capsule (60 mg total) by mouth daily. For back pain 90 capsule 1   ibuprofen (ADVIL) 800 MG tablet Take 1 tablet (800 mg total) by mouth every 8 (eight) hours as needed for  up to 21 doses for fever, headache, mild pain or moderate pain. 21 tablet 0   mupirocin ointment (BACTROBAN) 2 % Apply 1 application topically 2 (two) times daily. 60 g 0   nystatin-triamcinolone ointment (MYCOLOG) APPLY THIN FILM TO EXTERNAL VAGINAL AREA DAILY X 5 DAYS. DO NOT INSERT IN VAGINA. 30 g 0   amLODipine (NORVASC) 5 MG tablet Take 5 mg by mouth daily.     fluconazole (DIFLUCAN) 150 MG tablet TAKE 1 TABLET BY MOUTH EVERY DAY 3 tablet 0   glipiZIDE (GLUCOTROL) 5 MG tablet Take 1 tablet (5 mg total) by mouth 2 (two) times daily before a meal. 180 tablet 1   glipiZIDE (GLUCOTROL) 5 MG tablet Take 1 tablet (5 mg total) by mouth daily before breakfast. 30 tablet 0   hydrOXYzine (ATARAX) 25 MG tablet SMARTSIG:1 Tablet(s) By Mouth 1-4 Times Daily     linaclotide (LINZESS) 72 MCG capsule Take 1 capsule (72 mcg total) by mouth daily before breakfast. 90 capsule 2   Blood Glucose Monitoring Suppl (ONETOUCH VERIO REFLECT) w/Device KIT Check blood glucose level by fingerstick twice per day. Dx: E11.65 (Patient not taking: Reported on 01/05/2022) 1 kit 0   Docusate Sodium (DSS) 100 MG CAPS Take by mouth.     TRUEplus Lancets 28G MISC Use as instructed. Check blood glucose level by fingerstick twice per day.   E11.65 (Patient not taking: Reported on 01/05/2022) 100 each 3   citalopram (CELEXA) 20 MG tablet TAKE 1 TABLET BY MOUTH EVERY DAY (Patient not taking: Reported on 01/05/2022) 90 tablet 0   gabapentin (NEURONTIN) 300 MG capsule Take 300 mg by mouth 2 (two) times daily.     lansoprazole (PREVACID) 30 MG capsule Take 1 capsule (30 mg total) by mouth daily at 12 noon. 90 capsule 1   lisinopril (ZESTRIL) 10 MG tablet Take 1 tablet (10 mg total) by mouth daily. (Patient not taking: Reported on 01/05/2022) 30 tablet 0   lisinopril (ZESTRIL) 5 MG tablet TAKE 1 TABLET (5 MG TOTAL) BY MOUTH DAILY. (Patient not taking: Reported on 01/05/2022) 90 tablet 0   ondansetron (ZOFRAN-ODT) 4 MG disintegrating tablet TAKE 1  TABLET BY MOUTH EVERY 8 HOURS AS NEEDED FOR NAUSEA AND VOMITING (Patient not taking: Reported on 01/05/2022) 40 tablet 1   pregabalin (LYRICA) 50 MG capsule TAKE 1 CAPSULE BY MOUTH TWICE A DAY (Patient not taking: Reported on 01/05/2022) 60 capsule  0   tiZANidine (ZANAFLEX) 4 MG tablet TAKE 1 TABLET BY MOUTH EVERY 6 HOURS AS NEEDED FOR MUSCLE SPASMS. (Patient not taking: Reported on 01/05/2022) 360 tablet 0   traMADol (ULTRAM) 50 MG tablet Take 1 tablet (50 mg total) by mouth 3 (three) times daily as needed. 30 tablet 2   No facility-administered medications prior to visit.    Allergies  Allergen Reactions   Ciprofloxacin    Metformin And Related Diarrhea       Objective:    BP 121/76   Pulse 68   Ht _0  (1.727 m)   Wt 128 lb 9.6 oz (58.3 kg)   SpO2 98%   BMI 19.55 kg/m  Wt Readings from Last 3 Encounters:  01/05/22 128 lb 9.6 oz (58.3 kg)  05/12/21 128 lb 9.6 oz (58.3 kg)  02/10/21 139 lb (63 kg)    Physical Exam Vitals and nursing note reviewed.  Constitutional:      Appearance: She is well-developed.  HENT:     Head: Normocephalic and atraumatic.  Cardiovascular:     Rate and Rhythm: Normal rate and regular rhythm.     Heart sounds: Normal heart sounds. No murmur heard.    No friction rub. No gallop.  Pulmonary:     Effort: Pulmonary effort is normal. No tachypnea or respiratory distress.     Breath sounds: Normal breath sounds. No decreased breath sounds, wheezing, rhonchi or rales.  Chest:     Chest wall: No tenderness.  Abdominal:     General: Bowel sounds are normal.     Palpations: Abdomen is soft.  Musculoskeletal:        General: Normal range of motion.     Cervical back: Normal range of motion.  Skin:    General: Skin is warm and dry.  Neurological:     Mental Status: She is alert and oriented to person, place, and time.     Coordination: Coordination normal.  Psychiatric:        Behavior: Behavior normal. Behavior is cooperative.        Thought  Content: Thought content normal.        Judgment: Judgment normal.          Patient has been counseled extensively about nutrition and exercise as well as the importance of adherence with medications and regular follow-up. The patient was given clear instructions to go to ER or return to medical center if symptoms don't improve, worsen or new problems develop. The patient verbalized understanding.   Follow-up: Return in about 3 months (around 04/06/2022).   Gildardo Pounds, FNP-BC [facility] [place], [place]   01/05/2022, 12:08 PM

## 2022-01-06 LAB — CMP14+EGFR
ALT: 52 IU/L — ABNORMAL HIGH (ref 0–32)
AST: 36 IU/L (ref 0–40)
Albumin/Globulin Ratio: 1.4 (ref 1.2–2.2)
Albumin: 3.9 g/dL (ref 3.8–4.9)
Alkaline Phosphatase: 210 IU/L — ABNORMAL HIGH (ref 44–121)
BUN/Creatinine Ratio: 12 (ref 9–23)
BUN: 7 mg/dL (ref 6–24)
Bilirubin Total: <0.2 mg/dL (ref 0.0–1.2)
CO2: 29 mmol/L (ref 20–29)
Calcium: 9.3 mg/dL (ref 8.7–10.2)
Chloride: 102 mmol/L (ref 96–106)
Creatinine, Ser: 0.58 mg/dL (ref 0.57–1.00)
Globulin, Total: 2.7 g/dL (ref 1.5–4.5)
Glucose: 95 mg/dL (ref 70–99)
Potassium: 4.1 mmol/L (ref 3.5–5.2)
Sodium: 142 mmol/L (ref 134–144)
Total Protein: 6.6 g/dL (ref 6.0–8.5)
eGFR: 109 mL/min/{1.73_m2} (ref >59)

## 2022-01-06 LAB — CBC WITH DIFFERENTIAL/PLATELET
Basophils Absolute: 0 10*3/uL (ref 0.0–0.2)
Basos: 1 %
EOS (ABSOLUTE): 0.3 10*3/uL (ref 0.0–0.4)
Eos: 4 %
Hematocrit: 38.6 % (ref 34.0–46.6)
Hemoglobin: 12.8 g/dL (ref 11.1–15.9)
Immature Grans (Abs): 0 10*3/uL (ref 0.0–0.1)
Immature Granulocytes: 0 %
Lymphocytes Absolute: 3.5 10*3/uL — ABNORMAL HIGH (ref 0.7–3.1)
Lymphs: 49 %
MCH: 29.4 pg (ref 26.6–33.0)
MCHC: 33.2 g/dL (ref 31.5–35.7)
MCV: 89 fL (ref 79–97)
Monocytes Absolute: 0.5 10*3/uL (ref 0.1–0.9)
Monocytes: 7 %
Neutrophils Absolute: 2.7 10*3/uL (ref 1.4–7.0)
Neutrophils: 39 %
Platelets: 332 10*3/uL (ref 150–450)
RBC: 4.35 x10E6/uL (ref 3.77–5.28)
RDW: 12.6 % (ref 11.7–15.4)
WBC: 7 10*3/uL (ref 3.4–10.8)

## 2022-01-06 LAB — CANCER ANTIGEN 19-9: CA 19-9: 216 U/mL — ABNORMAL HIGH (ref 0–35)

## 2022-01-06 LAB — CA 125: Cancer Antigen (CA) 125: 5 U/mL (ref 0.0–38.1)

## 2022-01-11 ENCOUNTER — Ambulatory Visit: Payer: Medicaid Other | Admitting: Nurse Practitioner

## 2022-02-03 ENCOUNTER — Encounter: Payer: Self-pay | Admitting: Gastroenterology

## 2022-02-03 ENCOUNTER — Encounter: Payer: Self-pay | Admitting: Family Medicine

## 2022-02-03 ENCOUNTER — Ambulatory Visit: Payer: Commercial Managed Care - HMO | Attending: Nurse Practitioner | Admitting: Family Medicine

## 2022-02-03 ENCOUNTER — Telehealth: Payer: Self-pay | Admitting: Nurse Practitioner

## 2022-02-03 ENCOUNTER — Other Ambulatory Visit: Payer: Self-pay | Admitting: Nurse Practitioner

## 2022-02-03 VITALS — BP 121/73 | HR 87 | Temp 98.4°F | Ht 66.0 in | Wt 127.6 lb

## 2022-02-03 DIAGNOSIS — J0100 Acute maxillary sinusitis, unspecified: Secondary | ICD-10-CM

## 2022-02-03 DIAGNOSIS — E1151 Type 2 diabetes mellitus with diabetic peripheral angiopathy without gangrene: Secondary | ICD-10-CM

## 2022-02-03 LAB — POCT INFLUENZA A/B
Influenza A, POC: NEGATIVE
Influenza B, POC: NEGATIVE

## 2022-02-03 MED ORDER — AMOXICILLIN-POT CLAVULANATE 875-125 MG PO TABS: 1.0000 | ORAL_TABLET | Freq: Two times a day (BID) | ORAL | 0 refills | Status: DC

## 2022-02-03 NOTE — Telephone Encounter (Signed)
FYI

## 2022-02-03 NOTE — Patient Instructions (Signed)
Please call to schedule your appointment as the Clinic has been trying to reach you: [facility] [address] [place], [place] [address] Ph# [phone] Fax# [phone]

## 2022-02-03 NOTE — Progress Notes (Signed)
Subjective:  Patient ID: Denise Hale, female    DOB: 1970/07/20  Age: 52 y.o. MRN: [id number]  CC: flu symptoms   HPI Denise Hale is a 52 y.o. year old female patient of [name] with a history of type 2 diabetes mellitus who presents today for an acute visit.  Interval History:  For the last 3 weeks she has had diarrhea, vomiting, chills, body aches, subjective fever., headache. Last week she had a cough which was productive. She has been taking Mucinex with no relief.  Has also had malaise and fatigue. Her daughter also had similar symptoms.  She had a visit with her PCP 3 weeks ago and labs had revealed elevated LFTs for which she was referred to GI.  She is also awaiting appointment with endocrinology for management of diabetes and I have provided her with the number so she can call them to schedule as they have attempted to reach her several times but have been unsuccessful. Past Medical History:  Diagnosis Date   Anxiety    Asthma    Diverticulitis    DM (diabetes mellitus), type 2 with ophthalmic complications (HCC)    GERD (gastroesophageal reflux disease)    Severe nonproliferative diabetic retinopathy ([place])     Past Surgical History:  Procedure Laterality Date   CARPAL TUNNEL RELEASE Right 04/08/2020   Procedure: RIGHT CARPAL TUNNEL RELEASE;  Surgeon: Leandrew Koyanagi, MD;  Location: [facility];  Service: Orthopedics;  Laterality: Right;   CHOLECYSTECTOMY     COLON SURGERY      Family History  Problem Relation Age of Onset   Diabetes Mother    Thyroid disease Mother    Heart disease Father    Breast cancer Paternal Aunt    Breast cancer Paternal Aunt     Social History   Socioeconomic History   Marital status: Single    Spouse name: Not on file   Number of children: 2   Years of education: Not on file   Highest education level: Some college, no degree  Occupational History   Not on file  Tobacco Use   Smoking status: Some Days     Types: Cigars   Smokeless tobacco: Never  Vaping Use   Vaping Use: Never used  Substance and Sexual Activity   Alcohol use: Not Currently    Comment: occasionally   Drug use: Yes    Types: Marijuana   Sexual activity: Yes    Birth control/protection: None  Other Topics Concern   Not on file  Social History Narrative   Not on file   Social Determinants of Health   Financial Resource Strain: Not on file  Food Insecurity: Not on file  Transportation Needs: No Transportation Needs (07/09/2019)   PRAPARE - Hydrologist (Medical): No    Lack of Transportation (Non-Medical): No  Physical Activity: Not on file  Stress: Not on file  Social Connections: Not on file    Allergies  Allergen Reactions   Ciprofloxacin    Metformin And Related Diarrhea    Outpatient Medications Prior to Visit  Medication Sig Dispense Refill   amLODipine (NORVASC) 5 MG tablet Take 1 tablet (5 mg total) by mouth daily. 90 tablet 1   atorvastatin (LIPITOR) 20 MG tablet Take 1 tablet (20 mg total) by mouth daily. 90 tablet 3   Blood Glucose Monitoring Suppl (ONETOUCH VERIO REFLECT) w/Device KIT Check blood glucose level by fingerstick twice per day. Dx: E11.65  1 kit 0   citalopram (CELEXA) 20 MG tablet Take 1 tablet (20 mg total) by mouth daily. 90 tablet 1   Continuous Blood Gluc Receiver (FREESTYLE LIBRE 2 READER) DEVI Check blood glucose level 5 times per day. 1 each 0   Continuous Blood Gluc Sensor (FREESTYLE LIBRE 2 SENSOR) MISC Check blood glucose level 5 times per day. 2 each 6   Docusate Sodium (DSS) 100 MG CAPS Take by mouth.     DULoxetine (CYMBALTA) 60 MG capsule Take 1 capsule (60 mg total) by mouth daily. For back pain 90 capsule 1   fluconazole (DIFLUCAN) 150 MG tablet Take 1 tablet (150 mg total) by mouth daily. Chronic yeast vaginitis 3 tablet 0   gabapentin (NEURONTIN) 300 MG capsule Take 1 capsule (300 mg total) by mouth 3 (three) times daily. 90 capsule 1    glipiZIDE (GLIPIZIDE XL) 10 MG 24 hr tablet Take 1 tablet (10 mg total) by mouth daily with breakfast. 90 tablet 1   ibuprofen (ADVIL) 800 MG tablet Take 1 tablet (800 mg total) by mouth every 8 (eight) hours as needed for up to 21 doses for fever, headache, mild pain or moderate pain. 21 tablet 0   Insulin Glargine (BASAGLAR KWIKPEN) 100 UNIT/ML Inject 40 Units into the skin daily. 45 mL 1   insulin lispro (HUMALOG KWIKPEN) 100 UNIT/ML KwikPen Inject 3 Units into the skin 3 (three) times daily with meals. 15 mL 11   lansoprazole (PREVACID) 30 MG capsule Take 1 capsule (30 mg total) by mouth daily at 12 noon. 90 capsule 1   linaclotide (LINZESS) 72 MCG capsule Take 1 capsule (72 mcg total) by mouth daily before breakfast. For constipation 90 capsule 2   mupirocin ointment (BACTROBAN) 2 % Apply 1 application topically 2 (two) times daily. 60 g 0   nystatin-triamcinolone ointment (MYCOLOG) APPLY THIN FILM TO EXTERNAL VAGINAL AREA DAILY X 5 DAYS. DO NOT INSERT IN VAGINA. 30 g 0   ondansetron (ZOFRAN-ODT) 4 MG disintegrating tablet Take 1 tablet (4 mg total) by mouth every 8 (eight) hours as needed for nausea or vomiting. 60 tablet 1   tiZANidine (ZANAFLEX) 4 MG tablet Take 1 tablet (4 mg total) by mouth every 6 (six) hours as needed for muscle spasms. 90 tablet 3   TRUEplus Lancets 28G MISC Use as instructed. Check blood glucose level by fingerstick twice per day.   E11.65 100 each 3   No facility-administered medications prior to visit.     ROS Review of Systems  Constitutional:  Positive for chills, fatigue and fever. Negative for activity change and appetite change.  HENT:  Negative for sinus pressure and sore throat.   Respiratory:  Positive for cough. Negative for chest tightness, shortness of breath and wheezing.   Cardiovascular:  Negative for chest pain and palpitations.  Gastrointestinal:  Positive for diarrhea and vomiting. Negative for abdominal distention, abdominal pain and  constipation.  Genitourinary: Negative.   Musculoskeletal: Negative.   Neurological:  Positive for headaches.  Psychiatric/Behavioral:  Negative for behavioral problems and dysphoric mood.     Objective:  BP 121/73   Pulse 87   Temp 98.4 F (36.9 C) (Oral)   Ht 5\' 6"  (1.676 m)   Wt 127 lb 9.6 oz (57.9 kg)   SpO2 100%   BMI 20.60 kg/m      02/03/2022   10:39 AM 01/05/2022   11:26 AM 07/06/2021    1:00 PM  BP/Weight  Systolic BP 086 578 469  Diastolic BP 73 76 99  Wt. (Lbs) 127.6 128.6   BMI 20.6 kg/m2 19.55 kg/m2       Physical Exam Constitutional:      Appearance: She is well-developed.  HENT:     Right Ear: Tympanic membrane normal.     Left Ear: Tympanic membrane normal.     Mouth/Throat:     Comments: Oropharyngeal erythema Cardiovascular:     Rate and Rhythm: Normal rate.     Heart sounds: Normal heart sounds. No murmur heard. Pulmonary:     Effort: Pulmonary effort is normal.     Breath sounds: Normal breath sounds. No wheezing or rales.  Chest:     Chest wall: No tenderness.  Abdominal:     General: Bowel sounds are normal. There is no distension.     Palpations: Abdomen is soft. There is no mass.     Tenderness: There is no abdominal tenderness.  Musculoskeletal:        General: Normal range of motion.     Right lower leg: No edema.     Left lower leg: No edema.  Neurological:     Mental Status: She is alert and oriented to person, place, and time.  Psychiatric:        Mood and Affect: Mood normal.        Latest Ref Rng & Units 01/05/2022   12:10 PM 07/06/2021    3:29 AM 05/12/2021   12:15 PM  CMP  Glucose 70 - 99 mg/dL 95  742  595   BUN 6 - 24 mg/dL 7  11  5    Creatinine 0.57 - 1.00 mg/dL  6.38  7.56   Sodium 134 - 144 mmol/L 142  135  142   Potassium 3.5 - 5.2 mmol/L 4.1  3.4  4.1   Chloride 96 - 106 mmol/L 102  98  104   CO2 20 - 29 mmol/L 29  28  25    Calcium 8.7 - 10.2 mg/dL 9.3  9.2  9.3   Total Protein 6.0 - 8.5 g/dL 6.6  7.7  7.4    Total Bilirubin 0.0 - 1.2 mg/dL 4.33  0.5  0.3   Alkaline Phos 44 - 121 IU/L 210  221  154   AST 0 - 40 IU/L 36  37  49   ALT 0 - 32 IU/L 52  67  69     Lipid Panel     Component Value Date/Time   CHOL 134 02/10/2021 1207   TRIG 54 02/10/2021 1207   HDL 65 02/10/2021 1207   CHOLHDL 2.1 02/10/2021 1207   LDLCALC 57 02/10/2021 1207    CBC    Component Value Date/Time   WBC 7.0 01/05/2022 1210   WBC 7.6 07/06/2021 0329   RBC 4.35 01/05/2022 1210   RBC 3.92 07/06/2021 0329   HGB 12.8 01/05/2022 1210   HCT 38.6 01/05/2022 1210   PLT 332 01/05/2022 1210   MCV 89 01/05/2022 1210   MCH 29.4 01/05/2022 1210   MCH 30.1 07/06/2021 0329   MCHC 33.2 01/05/2022 1210   MCHC 33.1 07/06/2021 0329   RDW 12.6 01/05/2022 1210   LYMPHSABS 3.5 (H) 01/05/2022 1210   MONOABS 0.6 07/06/2021 0329   EOSABS 0.3 01/05/2022 1210   BASOSABS 0.0 01/05/2022 1210    Lab Results  Component Value Date   HGBA1C >15 01/05/2022    Assessment & Plan:  1. Acute non-recurrent maxillary sinusitis Symptoms have been present for about  3 weeks hence I will put her on an antibiotic - POCT Influenza A/B - amoxicillin-clavulanate (AUGMENTIN) 875-125 MG tablet; Take 1 tablet by mouth 2 (two) times daily.  Dispense: 20 tablet; Refill: 0 - Novel Coronavirus, NAA ([facility])    Meds ordered this encounter  Medications   amoxicillin-clavulanate (AUGMENTIN) 875-125 MG tablet    Sig: Take 1 tablet by mouth 2 (two) times daily.    Dispense:  20 tablet    Refill:  0    Follow-up: Return for previously scheduled appointment.       Charlott Rakes, MD, FAAFP. [facility] and [address], [place]   02/03/2022, 11:56 AM

## 2022-02-03 NOTE — Telephone Encounter (Signed)
Patient states that she spoke with her referral :  [facility]   Per there office the earliest appointment they have is not until November. Patient is wanting to see if she can get a referral to another office.  Please advise

## 2022-02-04 LAB — NOVEL CORONAVIRUS, NAA: SARS-CoV-2, NAA: NOT DETECTED

## 2022-02-18 ENCOUNTER — Telehealth: Payer: Self-pay | Admitting: Nurse Practitioner

## 2022-03-04 ENCOUNTER — Ambulatory Visit: Payer: Commercial Managed Care - HMO | Admitting: Gastroenterology

## 2022-04-06 ENCOUNTER — Ambulatory Visit: Payer: Commercial Managed Care - HMO | Admitting: Nurse Practitioner

## 2022-04-07 ENCOUNTER — Ambulatory Visit: Payer: Self-pay | Attending: Nurse Practitioner | Admitting: Nurse Practitioner

## 2022-04-07 ENCOUNTER — Other Ambulatory Visit: Payer: Self-pay

## 2022-04-07 ENCOUNTER — Other Ambulatory Visit (HOSPITAL_COMMUNITY)
Admission: RE | Admit: 2022-04-07 | Discharge: 2022-04-07 | Disposition: A | Discharge status: Home / Self Care | Payer: Medicaid Other | Source: Ambulatory Visit | Attending: Nurse Practitioner | Admitting: Nurse Practitioner

## 2022-04-07 ENCOUNTER — Encounter: Payer: Self-pay | Admitting: Nurse Practitioner

## 2022-04-07 VITALS — BP 144/78 | HR 78 | Ht 66.0 in | Wt 131.6 lb

## 2022-04-07 DIAGNOSIS — Z833 Family history of diabetes mellitus: Secondary | ICD-10-CM | POA: Insufficient documentation

## 2022-04-07 DIAGNOSIS — E1142 Type 2 diabetes mellitus with diabetic polyneuropathy: Secondary | ICD-10-CM

## 2022-04-07 DIAGNOSIS — Z79899 Other long term (current) drug therapy: Secondary | ICD-10-CM | POA: Insufficient documentation

## 2022-04-07 DIAGNOSIS — N761 Subacute and chronic vaginitis: Secondary | ICD-10-CM

## 2022-04-07 DIAGNOSIS — Z91199 Patient's noncompliance with other medical treatment and regimen due to unspecified reason: Secondary | ICD-10-CM | POA: Insufficient documentation

## 2022-04-07 DIAGNOSIS — M5442 Lumbago with sciatica, left side: Secondary | ICD-10-CM

## 2022-04-07 DIAGNOSIS — G8929 Other chronic pain: Secondary | ICD-10-CM

## 2022-04-07 DIAGNOSIS — K5909 Other constipation: Secondary | ICD-10-CM

## 2022-04-07 DIAGNOSIS — E1165 Type 2 diabetes mellitus with hyperglycemia: Secondary | ICD-10-CM | POA: Insufficient documentation

## 2022-04-07 DIAGNOSIS — F32A Depression, unspecified: Secondary | ICD-10-CM

## 2022-04-07 DIAGNOSIS — Z794 Long term (current) use of insulin: Secondary | ICD-10-CM | POA: Insufficient documentation

## 2022-04-07 DIAGNOSIS — N760 Acute vaginitis: Secondary | ICD-10-CM | POA: Insufficient documentation

## 2022-04-07 DIAGNOSIS — I10 Essential (primary) hypertension: Secondary | ICD-10-CM

## 2022-04-07 DIAGNOSIS — E11319 Type 2 diabetes mellitus with unspecified diabetic retinopathy without macular edema: Secondary | ICD-10-CM | POA: Insufficient documentation

## 2022-04-07 DIAGNOSIS — Z7984 Long term (current) use of oral hypoglycemic drugs: Secondary | ICD-10-CM | POA: Insufficient documentation

## 2022-04-07 DIAGNOSIS — Z1231 Encounter for screening mammogram for malignant neoplasm of breast: Secondary | ICD-10-CM

## 2022-04-07 DIAGNOSIS — M545 Low back pain, unspecified: Secondary | ICD-10-CM | POA: Insufficient documentation

## 2022-04-07 DIAGNOSIS — R110 Nausea: Secondary | ICD-10-CM

## 2022-04-07 DIAGNOSIS — Z8249 Family history of ischemic heart disease and other diseases of the circulatory system: Secondary | ICD-10-CM | POA: Insufficient documentation

## 2022-04-07 DIAGNOSIS — E785 Hyperlipidemia, unspecified: Secondary | ICD-10-CM

## 2022-04-07 DIAGNOSIS — F419 Anxiety disorder, unspecified: Secondary | ICD-10-CM | POA: Insufficient documentation

## 2022-04-07 DIAGNOSIS — E1151 Type 2 diabetes mellitus with diabetic peripheral angiopathy without gangrene: Secondary | ICD-10-CM

## 2022-04-07 DIAGNOSIS — Z23 Encounter for immunization: Secondary | ICD-10-CM

## 2022-04-07 DIAGNOSIS — K219 Gastro-esophageal reflux disease without esophagitis: Secondary | ICD-10-CM

## 2022-04-07 LAB — POCT URINALYSIS DIP (CLINITEK)
Bilirubin, UA: NEGATIVE
Glucose, UA: NEGATIVE mg/dL
Ketones, POC UA: NEGATIVE mg/dL
Nitrite, UA: NEGATIVE
POC PROTEIN,UA: 100 — AB
Spec Grav, UA: 1.025 (ref 1.010–1.025)
Urobilinogen, UA: 0.2 E.U./dL
pH, UA: 6 (ref 5.0–8.0)

## 2022-04-07 LAB — POCT GLYCOSYLATED HEMOGLOBIN (HGB A1C): Hemoglobin A1C: 12.3 % — AB (ref 4.0–5.6)

## 2022-04-07 MED ORDER — HUMULIN 70/30 KWIKPEN (70-30) 100 UNIT/ML ~~LOC~~ SUPN
30.0000 [IU] | PEN_INJECTOR | Freq: Two times a day (BID) | SUBCUTANEOUS | 11 refills | Status: DC
  Filled 2022-04-07: qty 15, 25d supply, fill #0

## 2022-04-07 MED ORDER — FLUCONAZOLE 150 MG PO TABS
ORAL_TABLET | ORAL | 0 refills | Status: DC
Start: 2022-04-07 — End: 2023-06-01
  Filled 2022-04-07: qty 27, 60d supply, fill #0

## 2022-04-07 MED ORDER — BASAGLAR KWIKPEN 100 UNIT/ML ~~LOC~~ SOPN
30.0000 [IU] | PEN_INJECTOR | Freq: Two times a day (BID) | SUBCUTANEOUS | 3 refills | Status: DC
Start: 2022-04-07 — End: 2022-07-13
  Filled 2022-04-07: qty 15, 25d supply, fill #0
  Filled 2022-06-27: qty 15, 25d supply, fill #1

## 2022-04-07 MED ORDER — TIZANIDINE HCL 4 MG PO TABS
4.0000 mg | ORAL_TABLET | Freq: Four times a day (QID) | ORAL | 3 refills | Status: DC | PRN
  Filled 2022-04-07: qty 90, 23d supply, fill #0
  Filled 2022-07-29: qty 90, 23d supply, fill #1
  Filled 2022-10-25 – 2022-10-28 (×2): qty 90, 23d supply, fill #2

## 2022-04-07 MED ORDER — GABAPENTIN 300 MG PO CAPS
300.0000 mg | ORAL_CAPSULE | Freq: Three times a day (TID) | ORAL | 1 refills | Status: DC
Start: 2022-04-07 — End: 2022-07-13
  Filled 2022-04-07: qty 90, 30d supply, fill #0
  Filled 2022-06-27 (×2): qty 90, 30d supply, fill #1

## 2022-04-07 MED ORDER — NYSTATIN-TRIAMCINOLONE 100000-0.1 UNIT/GM-% EX OINT
TOPICAL_OINTMENT | CUTANEOUS | 0 refills | Status: AC
Start: 2022-04-07 — End: ?
  Filled 2022-04-07: qty 30, 25d supply, fill #0

## 2022-04-07 MED ORDER — LANSOPRAZOLE 30 MG PO CPDR
30.0000 mg | DELAYED_RELEASE_CAPSULE | Freq: Every day | ORAL | 1 refills | Status: DC
Start: 2022-04-07 — End: 2022-10-25
  Filled 2022-04-07: qty 90, 90d supply, fill #0
  Filled 2022-07-12: qty 90, 90d supply, fill #1

## 2022-04-07 MED ORDER — LINACLOTIDE 72 MCG PO CAPS
72.0000 ug | ORAL_CAPSULE | Freq: Every day | ORAL | 2 refills | Status: DC
  Filled 2022-04-07: qty 90, 90d supply, fill #0

## 2022-04-07 MED ORDER — TRUEPLUS LANCETS 28G MISC
3 refills | Status: AC
Start: 2022-04-07 — End: ?
  Filled 2022-04-07: qty 100, 25d supply, fill #0

## 2022-04-07 MED ORDER — CITALOPRAM HYDROBROMIDE 20 MG PO TABS
20.00 mg | ORAL_TABLET | Freq: Every day | ORAL | 1 refills | Status: AC
Start: 2022-04-07 — End: ?
  Filled 2022-04-07: qty 90, 90d supply, fill #0
  Filled 2022-07-12: qty 90, 90d supply, fill #1

## 2022-04-07 MED ORDER — LISINOPRIL 5 MG PO TABS
5.0000 mg | ORAL_TABLET | Freq: Every day | ORAL | 3 refills | Status: DC
Start: 2022-04-07 — End: 2023-03-16
  Filled 2022-04-07: qty 30, 30d supply, fill #0
  Filled 2022-05-16 – 2022-05-25 (×2): qty 30, 30d supply, fill #1
  Filled 2022-06-27: qty 30, 30d supply, fill #2
  Filled 2022-07-29: qty 90, 90d supply, fill #3
  Filled 2022-10-25: qty 30, 30d supply, fill #4
  Filled 2022-12-06: qty 30, 30d supply, fill #5
  Filled 2023-01-05: qty 30, 30d supply, fill #6
  Filled 2023-02-10: qty 30, 30d supply, fill #7
  Filled 2023-03-16: qty 30, 30d supply, fill #8

## 2022-04-07 MED ORDER — ONDANSETRON 4 MG PO TBDP
4.0000 mg | ORAL_TABLET | Freq: Three times a day (TID) | ORAL | 1 refills | Status: DC | PRN
  Filled 2022-04-07: qty 60, 20d supply, fill #0
  Filled 2023-02-10: qty 60, 20d supply, fill #1

## 2022-04-07 MED ORDER — GLIPIZIDE ER 10 MG PO TB24
10.0000 mg | ORAL_TABLET | Freq: Every day | ORAL | 1 refills | Status: DC
Start: 2022-04-07 — End: 2022-07-13
  Filled 2022-04-07: qty 90, 90d supply, fill #0
  Filled 2022-07-12: qty 90, 90d supply, fill #1

## 2022-04-07 MED ORDER — ATORVASTATIN CALCIUM 20 MG PO TABS
20.0000 mg | ORAL_TABLET | Freq: Every day | ORAL | 3 refills | Status: DC
Start: 2022-04-07 — End: 2023-04-27
  Filled 2022-04-07: qty 90, 90d supply, fill #0
  Filled 2022-07-12: qty 90, 90d supply, fill #1
  Filled 2022-10-12: qty 90, 90d supply, fill #2
  Filled 2023-01-05: qty 90, 90d supply, fill #3

## 2022-04-07 NOTE — Progress Notes (Signed)
Vaginal discharge and swelling. Hands and feet swelling Lower back pain

## 2022-04-07 NOTE — Progress Notes (Signed)
Assessment & Plan:  Denise Hale was seen today for diabetes and hypertension.  Diagnoses and all orders for this visit:  Primary hypertension -     CMP14+EGFR Continue all antihypertensives as prescribed.  Reminded to bring in blood pressure log for follow  up appointment.  RECOMMENDATIONS: DASH/Mediterranean Diets are healthier choices for HTN.    Type 2 diabetes mellitus with diabetic peripheral angiopathy without gangrene, without long-term current use of insulin DOSE CHANGE OF BASAGLAR -     POCT glycosylated hemoglobin (Hb A1C) -     Microalbumin / creatinine urine ratio -     CMP14+EGFR -     glipiZIDE (GLIPIZIDE XL) 10 MG 24 hr tablet; Take 1 tablet (10 mg total) by mouth daily with breakfast. -     gabapentin (NEURONTIN) 300 MG capsule; Take 1 capsule (300 mg total) by mouth 3 (three) times daily. FOR BACK PAIN -     Insulin Glargine (BASAGLAR KWIKPEN) 100 UNIT/ML; 30 units BID -     TRUEplus Lancets 28G MISC; Use as instructed. Check blood glucose level by fingerstick twice per day.   E11.65   Breast cancer screening by mammogram -     MM 3D SCREENING MAMMOGRAM BILATERAL BREAST; Future  Acute vaginitis -     Cervicovaginal ancillary only  Need for Tdap vaccination -     Tdap vaccine greater than or equal to 7yo IM  Subacute and chronic vaginitis Needs better control with diabetes -     fluconazole (DIFLUCAN) 150 MG tablet; Take 1 tablet every 72 hours for 3 doses then take 1 tablet weekly for the next 6 months.  Anxiety and depression -     citalopram (CELEXA) 20 MG tablet; Take 1 tablet (20 mg total) by mouth daily. For depression  Dyslipidemia, goal LDL below 70 -     atorvastatin (LIPITOR) 20 MG tablet; Take 1 tablet (20 mg total) by mouth daily. FOR CHOLESTEROL  GERD without esophagitis INSTRUCTIONS: Avoid GERD Triggers: acidic, spicy or fried foods, caffeine, coffee, sodas,  alcohol and chocolate.   -     lansoprazole (PREVACID) 30 MG capsule; Take 1 capsule  (30 mg total) by mouth daily at 12 noon. FOR ACID REFLUX  Chronic constipation -     linaclotide (LINZESS) 72 MCG capsule; Take 1 capsule (72 mcg total) by mouth daily before breakfast. For constipation  Chronic vaginitis Needs better control with diabetes -     nystatin-triamcinolone ointment (MYCOLOG); APPLY THIN FILM TO EXTERNAL VAGINAL AREA DAILY X 5 DAYS. DO NOT INSERT INTO VAGINA    Chronic bilateral low back pain with left-sided sciatica -     tiZANidine (ZANAFLEX) 4 MG tablet; Take 1 tablet (4 mg total) by mouth every 6 (six) hours as needed for muscle spasms. -     POCT URINALYSIS DIP (CLINITEK)  Chronic nausea -     ondansetron (ZOFRAN-ODT) 4 MG disintegrating tablet; Take 1 tablet (4 mg total) by mouth every 8 (eight) hours as needed for nausea or vomiting.    Patient has been counseled on age-appropriate routine health concerns for screening and prevention. These are reviewed and up-to-date. Referrals have been placed accordingly. Immunizations are up-to-date or declined.    Subjective:   Chief Complaint  Patient presents with   Diabetes   Hypertension   HPI Denise Hale 52 y.o. female presents to office today for follow up to DM and HTN  She has a past medical history of Anxiety, Asthma, DM2, Diverticulitis, GERD, peripheral  neuropathy, HTN, and Severe nonproliferative diabetic retinopathy.      DM 2 She is not taking humalog as prescribed. Endorses adherence taking basaglar 40 units daily and glipizide 10 mg daily.  She can not tolerate metformin due to GI Upset. She was also prescribed trulicity in the past however due to ongoing unintentional weight loss this was stopped. Diabetes is poorly controlled. Associated symptoms include peripheral neuropathy for which she takes gabapentin and chronic vagnitis. Lyrica was stopped due to worsening visual changes. She is overdue for follow up with podiatry. She also takes cymbalta for chronic back pain.  Lab Results   Component Value Date   HGBA1C 12.3 (A) 04/07/2022    Lab Results  Component Value Date   HGBA1C >15 01/05/2022     Blood pressure slightly elevated today. Notes intermittent bilateral hand and foot swelling. Likely related to poorly controlled DM. Will add renal dose lisinopril.  BP Readings from Last 3 Encounters:  04/07/22 (!) 144/78  02/03/22 121/73  01/05/22 121/76       Review of Systems  Constitutional:  Negative for fever, malaise/fatigue and weight loss.  HENT: Negative.  Negative for nosebleeds.   Eyes: Negative.  Negative for blurred vision, double vision and photophobia.  Respiratory: Negative.  Negative for cough and shortness of breath.   Cardiovascular: Negative.  Negative for chest pain, palpitations and leg swelling.  Gastrointestinal:  Positive for nausea. Negative for abdominal pain, blood in stool, constipation, diarrhea, heartburn, melena and vomiting.  Genitourinary:        Chronic vaginitis  Musculoskeletal: Negative.  Negative for myalgias.  Neurological:  Positive for tingling and sensory change. Negative for dizziness, focal weakness, seizures and headaches.  Psychiatric/Behavioral: Negative.  Negative for suicidal ideas.     Past Medical History:  Diagnosis Date   Anxiety    Asthma    Diverticulitis    DM (diabetes mellitus), type 2 with ophthalmic complications    GERD (gastroesophageal reflux disease)    Severe nonproliferative diabetic retinopathy     Past Surgical History:  Procedure Laterality Date   CARPAL TUNNEL RELEASE Right 04/08/2020   Procedure: RIGHT CARPAL TUNNEL RELEASE;  Surgeon: Leandrew Koyanagi, MD;  Location: [place];  Service: Orthopedics;  Laterality: Right;   CHOLECYSTECTOMY     COLON SURGERY      Family History  Problem Relation Age of Onset   Diabetes Mother    Thyroid disease Mother    Heart disease Father    Breast cancer Paternal Aunt    Breast cancer Paternal Aunt     Social History Reviewed  with no changes to be made today.   Outpatient Medications Prior to Visit  Medication Sig Dispense Refill   Blood Glucose Monitoring Suppl (ONETOUCH VERIO REFLECT) w/Device KIT Check blood glucose level by fingerstick twice per day. Dx: E11.65 1 kit 0   atorvastatin (LIPITOR) 20 MG tablet Take 1 tablet (20 mg total) by mouth daily. 90 tablet 3   citalopram (CELEXA) 20 MG tablet Take 1 tablet (20 mg total) by mouth daily. 90 tablet 1   DULoxetine (CYMBALTA) 60 MG capsule Take 1 capsule (60 mg total) by mouth daily. For back pain 90 capsule 1   gabapentin (NEURONTIN) 300 MG capsule Take 1 capsule (300 mg total) by mouth 3 (three) times daily. 90 capsule 1   glipiZIDE (GLIPIZIDE XL) 10 MG 24 hr tablet Take 1 tablet (10 mg total) by mouth daily with breakfast. 90 tablet 1  ibuprofen (ADVIL) 800 MG tablet Take 1 tablet (800 mg total) by mouth every 8 (eight) hours as needed for up to 21 doses for fever, headache, mild pain or moderate pain. 21 tablet 0   Insulin Glargine (BASAGLAR KWIKPEN) 100 UNIT/ML Inject 40 Units into the skin daily. 45 mL 1   insulin lispro (HUMALOG KWIKPEN) 100 UNIT/ML KwikPen Inject 3 Units into the skin 3 (three) times daily with meals. 15 mL 11   lansoprazole (PREVACID) 30 MG capsule Take 1 capsule (30 mg total) by mouth daily at 12 noon. 90 capsule 1   linaclotide (LINZESS) 72 MCG capsule Take 1 capsule (72 mcg total) by mouth daily before breakfast. For constipation 90 capsule 2   mupirocin ointment (BACTROBAN) 2 % Apply 1 application topically 2 (two) times daily. 60 g 0   nystatin-triamcinolone ointment (MYCOLOG) APPLY THIN FILM TO EXTERNAL VAGINAL AREA DAILY X 5 DAYS. DO NOT INSERT IN VAGINA. 30 g 0   ondansetron (ZOFRAN-ODT) 4 MG disintegrating tablet Take 1 tablet (4 mg total) by mouth every 8 (eight) hours as needed for nausea or vomiting. 60 tablet 1   tiZANidine (ZANAFLEX) 4 MG tablet Take 1 tablet (4 mg total) by mouth every 6 (six) hours as needed for muscle  spasms. 90 tablet 3   TRUEplus Lancets 28G MISC Use as instructed. Check blood glucose level by fingerstick twice per day.   E11.65 100 each 3   amLODipine (NORVASC) 5 MG tablet Take 1 tablet (5 mg total) by mouth daily. (Patient not taking: Reported on 04/07/2022) 90 tablet 1   Continuous Blood Gluc Receiver (FREESTYLE LIBRE 2 READER) DEVI Check blood glucose level 5 times per day. (Patient not taking: Reported on 04/07/2022) 1 each 0   Continuous Blood Gluc Sensor (FREESTYLE LIBRE 2 SENSOR) MISC Check blood glucose level 5 times per day. (Patient not taking: Reported on 04/07/2022) 2 each 6   Docusate Sodium (DSS) 100 MG CAPS Take by mouth.     amoxicillin-clavulanate (AUGMENTIN) 875-125 MG tablet Take 1 tablet by mouth 2 (two) times daily. (Patient not taking: Reported on 04/07/2022) 20 tablet 0   fluconazole (DIFLUCAN) 150 MG tablet Take 1 tablet (150 mg total) by mouth daily. Chronic yeast vaginitis (Patient not taking: Reported on 04/07/2022) 3 tablet 0   No facility-administered medications prior to visit.    Allergies  Allergen Reactions   Ciprofloxacin    Metformin And Related Diarrhea       Objective:    BP (!) 144/78   Pulse 78   Ht 5\' 6"  (1.676 m)   Wt 131 lb 9.6 oz (59.7 kg)   SpO2 92%   BMI 21.24 kg/m  Wt Readings from Last 3 Encounters:  04/07/22 131 lb 9.6 oz (59.7 kg)  02/03/22 127 lb 9.6 oz (57.9 kg)  01/05/22 128 lb 9.6 oz (58.3 kg)    Physical Exam Vitals and nursing note reviewed.  Constitutional:      Appearance: She is well-developed.  HENT:     Head: Normocephalic and atraumatic.  Cardiovascular:     Rate and Rhythm: Normal rate and regular rhythm.     Heart sounds: Normal heart sounds. No murmur heard.    No friction rub. No gallop.  Pulmonary:     Effort: Pulmonary effort is normal. No tachypnea or respiratory distress.     Breath sounds: Normal breath sounds. No decreased breath sounds, wheezing, rhonchi or rales.  Chest:     Chest wall: No tenderness.  Abdominal:     General: Bowel sounds are normal.     Palpations: Abdomen is soft.  Musculoskeletal:        General: Normal range of motion.     Cervical back: Normal range of motion.  Skin:    General: Skin is warm and dry.  Neurological:     Mental Status: She is alert and oriented to person, place, and time.     Coordination: Coordination normal.  Psychiatric:        Behavior: Behavior normal. Behavior is cooperative.        Thought Content: Thought content normal.        Judgment: Judgment normal.          Patient has been counseled extensively about nutrition and exercise as well as the importance of adherence with medications and regular follow-up. The patient was given clear instructions to go to ER or return to medical center if symptoms don't improve, worsen or new problems develop. The patient verbalized understanding.   Follow-up: Return in about 3 months (around 07/07/2022).   Gildardo Pounds, FNP-BC [facility] and [place], [place]   04/07/2022, 12:15 PM

## 2022-04-08 ENCOUNTER — Other Ambulatory Visit: Payer: Self-pay

## 2022-04-08 ENCOUNTER — Telehealth: Payer: Self-pay | Admitting: Nurse Practitioner

## 2022-04-08 LAB — CERVICOVAGINAL ANCILLARY ONLY
Bacterial Vaginitis (gardnerella): POSITIVE — AB
Candida Glabrata: NEGATIVE
Candida Vaginitis: NEGATIVE
Chlamydia: NEGATIVE
Comment: NEGATIVE
Comment: NEGATIVE
Comment: NEGATIVE
Comment: NEGATIVE
Comment: NEGATIVE
Comment: NORMAL
Neisseria Gonorrhea: NEGATIVE
Trichomonas: POSITIVE — AB

## 2022-04-08 LAB — CMP14+EGFR
ALT: 44 IU/L — ABNORMAL HIGH (ref 0–32)
AST: 27 IU/L (ref 0–40)
Albumin/Globulin Ratio: 1.2 (ref 1.2–2.2)
Albumin: 3.8 g/dL (ref 3.8–4.9)
Alkaline Phosphatase: 132 IU/L — ABNORMAL HIGH (ref 44–121)
BUN/Creatinine Ratio: 15 (ref 9–23)
BUN: 8 mg/dL (ref 6–24)
Bilirubin Total: <0.2 mg/dL (ref 0.0–1.2)
CO2: 26 mmol/L (ref 20–29)
Calcium: 9 mg/dL (ref 8.7–10.2)
Chloride: 102 mmol/L (ref 96–106)
Creatinine, Ser: 0.53 mg/dL — ABNORMAL LOW (ref 0.57–1.00)
Globulin, Total: 3.1 g/dL (ref 1.5–4.5)
Glucose: 129 mg/dL — ABNORMAL HIGH (ref 70–99)
Potassium: 4.2 mmol/L (ref 3.5–5.2)
Sodium: 140 mmol/L (ref 134–144)
Total Protein: 6.9 g/dL (ref 6.0–8.5)
eGFR: 111 mL/min/{1.73_m2} (ref >59)

## 2022-04-08 LAB — MICROALBUMIN / CREATININE URINE RATIO
Creatinine, Urine: 68.7 mg/dL
Microalb/Creat Ratio: 327 mg/g creat — ABNORMAL HIGH (ref 0–29)
Microalbumin, Urine: 224.9 ug/mL

## 2022-04-08 NOTE — Telephone Encounter (Signed)
Routing to CMA 

## 2022-04-08 NOTE — Telephone Encounter (Signed)
Return call unanswered.  Message sent though mychart.

## 2022-04-08 NOTE — Telephone Encounter (Signed)
Patient needs a call back to discuss her test results as well as the medication that she was suppose to get for her yeast infection was not at the pharmacy only a cream. Patient stated she was suppose to have a 1 day treatment med. Med should be sent to [facility], [facility] Phone: [phone]  Fax: [phone]   Please advise

## 2022-04-10 ENCOUNTER — Other Ambulatory Visit: Payer: Self-pay | Admitting: Nurse Practitioner

## 2022-04-10 MED ORDER — METRONIDAZOLE 500 MG PO TABS: 500.0000 mg | ORAL_TABLET | Freq: Two times a day (BID) | ORAL | 0 refills | Status: AC

## 2022-04-11 ENCOUNTER — Other Ambulatory Visit: Payer: Self-pay

## 2022-04-12 ENCOUNTER — Other Ambulatory Visit: Payer: Self-pay

## 2022-04-25 ENCOUNTER — Ambulatory Visit: Payer: Commercial Managed Care - HMO | Admitting: Gastroenterology

## 2022-04-25 ENCOUNTER — Encounter: Payer: Self-pay | Admitting: Gastroenterology

## 2022-04-25 NOTE — Progress Notes (Deleted)
HPI :    CT Chest/Abdomen/Pelvis July 2023 (to evaluate unintentional weight loss) IMPRESSION: 1. No acute or inflammatory process identified in the chest, abdomen, or pelvis. 2. Calcified coronary artery atherosclerosis. 3. Distended urinary bladder (445 mL).  No obstructive uropathy.    Jan 2024 Component Ref Range & Units 3 mo ago  CA 19-9 0 - 35 U/mL 216 High     Past Medical History:  Diagnosis Date   Anxiety    Asthma    Diverticulitis    DM (diabetes mellitus), type 2 with ophthalmic complications    GERD (gastroesophageal reflux disease)    Severe nonproliferative diabetic retinopathy      Past Surgical History:  Procedure Laterality Date   CARPAL TUNNEL RELEASE Right 04/08/2020   Procedure: RIGHT CARPAL TUNNEL RELEASE;  Surgeon: Tarry Kos, MD;  Location: [facility];  Service: Orthopedics;  Laterality: Right;   CHOLECYSTECTOMY     COLON SURGERY     Family History  Problem Relation Age of Onset   Diabetes Mother    Thyroid disease Mother    Heart disease Father    Breast cancer Paternal Aunt    Breast cancer Paternal Aunt    Social History   Tobacco Use   Smoking status: Some Days    Types: Cigars   Smokeless tobacco: Never  Vaping Use   Vaping Use: Former  Substance Use Topics   Alcohol use: Not Currently    Comment: occasionally   Drug use: Yes    Types: Marijuana   Current Outpatient Medications  Medication Sig Dispense Refill   amLODipine (NORVASC) 5 MG tablet Take 1 tablet (5 mg total) by mouth daily. (Patient not taking: Reported on 04/07/2022) 90 tablet 1   atorvastatin (LIPITOR) 20 MG tablet Take 1 tablet (20 mg total) by mouth daily. FOR CHOLESTEROL 90 tablet 3   Blood Glucose Monitoring Suppl (ONETOUCH VERIO REFLECT) w/Device KIT Check blood glucose level by fingerstick twice per day. Dx: E11.65 1 kit 0   citalopram (CELEXA) 20 MG tablet Take 1 tablet (20 mg total) by mouth daily. For depression 90 tablet 1    Continuous Blood Gluc Receiver (FREESTYLE LIBRE 2 READER) DEVI Check blood glucose level 5 times per day. (Patient not taking: Reported on 04/07/2022) 1 each 0   Continuous Blood Gluc Sensor (FREESTYLE LIBRE 2 SENSOR) MISC Check blood glucose level 5 times per day. (Patient not taking: Reported on 04/07/2022) 2 each 6   Docusate Sodium (DSS) 100 MG CAPS Take by mouth.     fluconazole (DIFLUCAN) 150 MG tablet Take 1 tablet every 72 hours for 3 doses then take 1 tablet weekly for the next 6 months. 27 tablet 0   gabapentin (NEURONTIN) 300 MG capsule Take 1 capsule (300 mg total) by mouth 3 (three) times daily. FOR BACK PAIN 90 capsule 1   glipiZIDE (GLIPIZIDE XL) 10 MG 24 hr tablet Take 1 tablet (10 mg total) by mouth daily with breakfast. 90 tablet 1   Insulin Glargine (BASAGLAR KWIKPEN) 100 UNIT/ML Inject 30 Units into the skin 2 (two) times daily. 60 mL 3   lansoprazole (PREVACID) 30 MG capsule Take 1 capsule (30 mg total) by mouth daily at 12 noon. FOR ACID REFLUX 90 capsule 1   linaclotide (LINZESS) 72 MCG capsule Take 1 capsule (72 mcg total) by mouth daily before breakfast. For constipation 90 capsule 2   lisinopril (ZESTRIL) 5 MG tablet Take 1 tablet (5 mg total) by mouth daily.  90 tablet 3   nystatin-triamcinolone ointment (MYCOLOG) APPLY THIN FILM TO EXTERNAL VAGINAL AREA DAILY X 5 DAYS. DO NOT INSERT INTO VAGINA 30 g 0   ondansetron (ZOFRAN-ODT) 4 MG disintegrating tablet Take 1 tablet (4 mg total) by mouth every 8 (eight) hours as needed for nausea or vomiting. 60 tablet 1   tiZANidine (ZANAFLEX) 4 MG tablet Take 1 tablet (4 mg total) by mouth every 6 (six) hours as needed for muscle spasms. 90 tablet 3   TRUEplus Lancets 28G MISC Use as instructed. Check blood glucose level by fingerstick twice per day.   E11.65 100 each 3   No current facility-administered medications for this visit.   Allergies  Allergen Reactions   Ciprofloxacin    Metformin And Related Diarrhea     Review of  Systems: All systems reviewed and negative except where noted in HPI.    No results found.  Physical Exam: There were no vitals taken for this visit. Constitutional: Pleasant,well-developed, ***female in no acute distress. HEENT: Normocephalic and atraumatic. Conjunctivae are normal. No scleral icterus. Neck supple.  Cardiovascular: Normal rate, regular rhythm.  Pulmonary/chest: Effort normal and breath sounds normal. No wheezing, rales or rhonchi. Abdominal: Soft, nondistended, nontender. Bowel sounds active throughout. There are no masses palpable. No hepatomegaly. Extremities: no edema Lymphadenopathy: No cervical adenopathy noted. Neurological: Alert and oriented to person place and time. Skin: Skin is warm and dry. No rashes noted. Psychiatric: Normal mood and affect. Behavior is normal.  CBC    Component Value Date/Time   WBC 7.0 01/05/2022 1210   WBC 7.6 07/06/2021 0329   RBC 4.35 01/05/2022 1210   RBC 3.92 07/06/2021 0329   HGB 12.8 01/05/2022 1210   HCT 38.6 01/05/2022 1210   PLT 332 01/05/2022 1210   MCV 89 01/05/2022 1210   MCH 29.4 01/05/2022 1210   MCH 30.1 07/06/2021 0329   MCHC 33.2 01/05/2022 1210   MCHC 33.1 07/06/2021 0329   RDW 12.6 01/05/2022 1210   LYMPHSABS 3.5 (H) 01/05/2022 1210   MONOABS 0.6 07/06/2021 0329   EOSABS 0.3 01/05/2022 1210   BASOSABS 0.0 01/05/2022 1210    CMP     Component Value Date/Time   NA 140 04/07/2022 1115   K 4.2 04/07/2022 1115   CL 102 04/07/2022 1115   CO2 26 04/07/2022 1115   GLUCOSE 129 (H) 04/07/2022 1115   GLUCOSE 406 (H) 07/06/2021 0329   BUN 8 04/07/2022 1115   CREATININE 0.53 (L) 04/07/2022 1115   CALCIUM 9.0 04/07/2022 1115   PROT 6.9 04/07/2022 1115   ALBUMIN 3.8 04/07/2022 1115   AST 27 04/07/2022 1115   ALT 44 (H) 04/07/2022 1115   ALKPHOS 132 (H) 04/07/2022 1115   BILITOT <0.2 04/07/2022 1115   GFRNONAA >60 07/06/2021 0329   GFRAA 120 02/27/2020 0946     ASSESSMENT AND PLAN:  Claiborne Rigg, NP

## 2022-05-16 ENCOUNTER — Other Ambulatory Visit: Payer: Self-pay

## 2022-05-20 ENCOUNTER — Other Ambulatory Visit: Payer: Self-pay

## 2022-05-25 ENCOUNTER — Other Ambulatory Visit: Payer: Self-pay

## 2022-06-27 ENCOUNTER — Other Ambulatory Visit: Payer: Self-pay | Admitting: Nurse Practitioner

## 2022-06-27 ENCOUNTER — Other Ambulatory Visit: Payer: Self-pay

## 2022-06-28 NOTE — Telephone Encounter (Signed)
Requested Prescriptions  Refused Prescriptions Disp Refills   insulin aspart (NOVOLOG FLEXPEN) 100 UNIT/ML FlexPen 15 mL 11    Sig: FOR BLOOD SUGARS 0-150 GIVE 0 UNITS OF INSULIN, 151-200 GIVE 2 UNITS OF INSULIN, 201-250 GIVE 4 UNITS, 251-300 GIVE 6 UNITS, 301-350 GIVE 8 UNITS,     Endocrinology:  Diabetes - Insulins Failed - 06/27/2022  1:51 PM      Failed - HBA1C is between 0 and 7.9 and within 180 days    Hemoglobin A1C  Date Value Ref Range Status  04/07/2022 12.3 (A) 4.0 - 5.6 % Final   HbA1c, POC (controlled diabetic range)  Date Value Ref Range Status  09/01/2020 6.6 0.0 - 7.0 % Final   HbA1c POC (<> result, manual entry)  Date Value Ref Range Status  01/05/2022 >15 4.0 - 5.6 % Final         Passed - Valid encounter within last 6 months    Recent Outpatient Visits           2 months ago Primary hypertension   [facility] [place], Shea Stakes, NP   4 months ago Acute non-recurrent maxillary sinusitis   [facility] [place], Odette Horns, MD   5 months ago Type 2 diabetes mellitus with diabetic peripheral angiopathy without gangrene, without long-term current use of insulin [facility])   [facility] [place], [place] W, NP   1 year ago Type 2 diabetes mellitus with diabetic peripheral angiopathy without gangrene, without long-term current use of insulin [facility])   [facility] [place], [place] W, NP   1 year ago Type 2 diabetes mellitus with diabetic polyneuropathy, with long-term current use of insulin [facility])   [facility] [place], Shea Stakes, NP       Future Appointments             In 2 weeks Claiborne Rigg, NP [facility]   In 4 months [place], Konrad Dolores, MD [facility]

## 2022-06-29 ENCOUNTER — Other Ambulatory Visit: Payer: Self-pay

## 2022-07-05 ENCOUNTER — Ambulatory Visit: Payer: Medicaid Other | Admitting: Gastroenterology

## 2022-07-12 ENCOUNTER — Other Ambulatory Visit: Payer: Self-pay

## 2022-07-13 ENCOUNTER — Ambulatory Visit: Payer: Self-pay | Attending: Nurse Practitioner | Admitting: Nurse Practitioner

## 2022-07-13 ENCOUNTER — Encounter: Payer: Self-pay | Admitting: Nurse Practitioner

## 2022-07-13 ENCOUNTER — Other Ambulatory Visit: Payer: Self-pay | Admitting: Pharmacist

## 2022-07-13 ENCOUNTER — Other Ambulatory Visit: Payer: Self-pay

## 2022-07-13 VITALS — BP 137/70 | HR 69 | Ht 66.0 in | Wt 137.4 lb

## 2022-07-13 DIAGNOSIS — E1165 Type 2 diabetes mellitus with hyperglycemia: Secondary | ICD-10-CM | POA: Insufficient documentation

## 2022-07-13 DIAGNOSIS — Z79899 Other long term (current) drug therapy: Secondary | ICD-10-CM | POA: Insufficient documentation

## 2022-07-13 DIAGNOSIS — Z1211 Encounter for screening for malignant neoplasm of colon: Secondary | ICD-10-CM

## 2022-07-13 DIAGNOSIS — M7989 Other specified soft tissue disorders: Secondary | ICD-10-CM | POA: Insufficient documentation

## 2022-07-13 DIAGNOSIS — F32A Depression, unspecified: Secondary | ICD-10-CM | POA: Insufficient documentation

## 2022-07-13 DIAGNOSIS — K219 Gastro-esophageal reflux disease without esophagitis: Secondary | ICD-10-CM | POA: Insufficient documentation

## 2022-07-13 DIAGNOSIS — F419 Anxiety disorder, unspecified: Secondary | ICD-10-CM

## 2022-07-13 DIAGNOSIS — Z7984 Long term (current) use of oral hypoglycemic drugs: Secondary | ICD-10-CM | POA: Insufficient documentation

## 2022-07-13 DIAGNOSIS — Z794 Long term (current) use of insulin: Secondary | ICD-10-CM | POA: Insufficient documentation

## 2022-07-13 DIAGNOSIS — J45909 Unspecified asthma, uncomplicated: Secondary | ICD-10-CM | POA: Insufficient documentation

## 2022-07-13 DIAGNOSIS — E1151 Type 2 diabetes mellitus with diabetic peripheral angiopathy without gangrene: Secondary | ICD-10-CM

## 2022-07-13 DIAGNOSIS — I10 Essential (primary) hypertension: Secondary | ICD-10-CM | POA: Insufficient documentation

## 2022-07-13 DIAGNOSIS — E11319 Type 2 diabetes mellitus with unspecified diabetic retinopathy without macular edema: Secondary | ICD-10-CM | POA: Insufficient documentation

## 2022-07-13 DIAGNOSIS — E1142 Type 2 diabetes mellitus with diabetic polyneuropathy: Secondary | ICD-10-CM

## 2022-07-13 LAB — POCT GLYCOSYLATED HEMOGLOBIN (HGB A1C): Hemoglobin A1C: 11.9 % — AB (ref 4.0–5.6)

## 2022-07-13 MED ORDER — INSULIN LISPRO PROT & LISPRO (75-25 MIX) 100 UNIT/ML KWIKPEN
30.0000 [IU] | PEN_INJECTOR | Freq: Two times a day (BID) | SUBCUTANEOUS | 3 refills | Status: DC
  Filled 2022-07-13: qty 15, 25d supply, fill #0
  Filled 2022-10-25: qty 15, 25d supply, fill #1

## 2022-07-13 MED ORDER — CITALOPRAM HYDROBROMIDE 20 MG PO TABS
20.0000 mg | ORAL_TABLET | Freq: Every day | ORAL | 1 refills | Status: DC
Start: 2022-07-13 — End: 2023-04-27
  Filled 2022-10-12: qty 90, 90d supply, fill #0
  Filled 2023-01-05: qty 90, 90d supply, fill #1

## 2022-07-13 MED ORDER — GABAPENTIN 600 MG PO TABS
600.0000 mg | ORAL_TABLET | Freq: Three times a day (TID) | ORAL | 3 refills | Status: DC
Start: 2022-07-13 — End: 2023-02-10
  Filled 2022-07-13: qty 90, 30d supply, fill #0
  Filled 2022-10-12: qty 90, 30d supply, fill #1
  Filled 2022-11-10 – 2022-12-06 (×2): qty 90, 30d supply, fill #2
  Filled 2023-01-05: qty 90, 30d supply, fill #3

## 2022-07-13 MED ORDER — GLIPIZIDE ER 10 MG PO TB24
10.0000 mg | ORAL_TABLET | Freq: Every day | ORAL | 1 refills | Status: DC
Start: 2022-07-13 — End: 2022-11-10
  Filled 2022-10-12: qty 90, 90d supply, fill #0

## 2022-07-13 MED ORDER — HUMULIN 70/30 KWIKPEN (70-30) 100 UNIT/ML ~~LOC~~ SUPN
30.0000 [IU] | PEN_INJECTOR | Freq: Two times a day (BID) | SUBCUTANEOUS | 11 refills | Status: DC
  Filled 2022-07-13: qty 15, 25d supply, fill #0

## 2022-07-13 NOTE — Progress Notes (Signed)
Hands and lower leg to feet swelling

## 2022-07-13 NOTE — Progress Notes (Signed)
Assessment & Plan:  Denise Hale was seen today for diabetes.  Diagnoses and all orders for this visit:  Type 2 diabetes mellitus with diabetic peripheral angiopathy without gangrene, without long-term current use of insulin (HCC) Initiated 75/25 humalog mix continue all other medications as prescribed -     POCT glycosylated hemoglobin (Hb A1C) -     glipiZIDE (GLIPIZIDE XL) 10 MG 24 hr tablet; Take 1 tablet (10 mg total) by mouth daily with breakfast. -     CMP14+EGFR  Colon cancer screening -     Fecal occult blood, imunochemical  Anxiety and depression -     citalopram (CELEXA) 20 MG tablet; Take 1 tablet (20 mg total) by mouth daily. For depression  Type 2 diabetes mellitus with diabetic polyneuropathy, with long-term current use of insulin (HCC) -     gabapentin (NEURONTIN) 600 MG tablet; Take 1 tablet (600 mg total) by mouth 3 (three) times daily. -     Ambulatory referral to Podiatry      Patient has been counseled on age-appropriate routine health concerns for screening and prevention. These are reviewed and up-to-date. Referrals have been placed accordingly. Immunizations are up-to-date or declined.    Subjective:   Chief Complaint  Patient presents with   Diabetes   HPI Denise Hale 52 y.o. female presents to office today for follow up to DM  She has a past medical history of Anxiety, Asthma, DM2, Diverticulitis, GERD, peripheral neuropathy, HTN, and Severe nonproliferative diabetic retinopathy.     DM 2 Diabetes continues to be poorly controlled. She endorses adherence taking basaglar 30 units BID and glipizide 10 mg daily.  Complications of diabetes include peripheral neuropathy and diabetic retinopathy. She does not feel gabapentin 300 mg TID is effective. Pain is persistent likely due to poorly controlled diabetes. She endorses intermittent hand and leg swelling however none present today on exam. She is on renal dose ACE and longterm diflucan for chronic  yeast. Lab Results  Component Value Date   HGBA1C 11.9 (A) 07/13/2022    HTN Blood pressure is at goal BP Readings from Last 3 Encounters:  07/13/22 137/70  04/07/22 (!) 144/78  02/03/22 121/73     Review of Systems  Constitutional:  Negative for fever, malaise/fatigue and weight loss.  HENT: Negative.  Negative for nosebleeds.   Eyes: Negative.  Negative for blurred vision, double vision and photophobia.  Respiratory: Negative.  Negative for cough and shortness of breath.   Cardiovascular: Negative.  Negative for chest pain, palpitations and leg swelling.  Gastrointestinal: Negative.  Negative for heartburn, nausea and vomiting.  Musculoskeletal: Negative.  Negative for myalgias.  Neurological:  Positive for sensory change. Negative for dizziness, focal weakness, seizures and headaches.  Psychiatric/Behavioral:  Positive for depression. Negative for suicidal ideas. The patient is nervous/anxious.     Past Medical History:  Diagnosis Date   Anxiety    Asthma    Diverticulitis    DM (diabetes mellitus), type 2 with ophthalmic complications (HCC)    GERD (gastroesophageal reflux disease)    Severe nonproliferative diabetic retinopathy (HCC)     Past Surgical History:  Procedure Laterality Date   CARPAL TUNNEL RELEASE Right 04/08/2020   Procedure: RIGHT CARPAL TUNNEL RELEASE;  Surgeon: Tarry Kos, MD;  Location: [facility];  Service: Orthopedics;  Laterality: Right;   CHOLECYSTECTOMY     COLON SURGERY      Family History  Problem Relation Age of Onset   Diabetes Mother  Thyroid disease Mother    Heart disease Father    Breast cancer Paternal Aunt    Breast cancer Paternal Aunt     Social History Reviewed with no changes to be made today.   Outpatient Medications Prior to Visit  Medication Sig Dispense Refill   amLODipine (NORVASC) 5 MG tablet Take 1 tablet (5 mg total) by mouth daily. 90 tablet 1   atorvastatin (LIPITOR) 20 MG tablet Take 1  tablet (20 mg total) by mouth daily. FOR CHOLESTEROL 90 tablet 3   Blood Glucose Monitoring Suppl (ONETOUCH VERIO REFLECT) w/Device KIT Check blood glucose level by fingerstick twice per day. Dx: E11.65 1 kit 0   Docusate Sodium (DSS) 100 MG CAPS Take by mouth.     fluconazole (DIFLUCAN) 150 MG tablet Take 1 tablet every 72 hours for 3 doses then take 1 tablet weekly for the next 6 months. 27 tablet 0   lansoprazole (PREVACID) 30 MG capsule Take 1 capsule (30 mg total) by mouth daily at 12 noon. FOR ACID REFLUX 90 capsule 1   lisinopril (ZESTRIL) 5 MG tablet Take 1 tablet (5 mg total) by mouth daily. 90 tablet 3   nystatin-triamcinolone ointment (MYCOLOG) APPLY THIN FILM TO EXTERNAL VAGINAL AREA DAILY X 5 DAYS. DO NOT INSERT INTO VAGINA 30 g 0   ondansetron (ZOFRAN-ODT) 4 MG disintegrating tablet Take 1 tablet (4 mg total) by mouth every 8 (eight) hours as needed for nausea or vomiting. 60 tablet 1   tiZANidine (ZANAFLEX) 4 MG tablet Take 1 tablet (4 mg total) by mouth every 6 (six) hours as needed for muscle spasms. 90 tablet 3   TRUEplus Lancets 28G MISC Use as instructed. Check blood glucose level by fingerstick twice per day.   E11.65 100 each 3   citalopram (CELEXA) 20 MG tablet Take 1 tablet (20 mg total) by mouth daily. For depression 90 tablet 1   gabapentin (NEURONTIN) 300 MG capsule Take 1 capsule (300 mg total) by mouth 3 (three) times daily. FOR BACK PAIN 90 capsule 1   glipiZIDE (GLIPIZIDE XL) 10 MG 24 hr tablet Take 1 tablet (10 mg total) by mouth daily with breakfast. 90 tablet 1   Insulin Glargine (BASAGLAR KWIKPEN) 100 UNIT/ML Inject 30 Units into the skin 2 (two) times daily. 60 mL 3   Continuous Blood Gluc Receiver (FREESTYLE LIBRE 2 READER) DEVI Check blood glucose level 5 times per day. (Patient not taking: Reported on 07/13/2022) 1 each 0   Continuous Blood Gluc Sensor (FREESTYLE LIBRE 2 SENSOR) MISC Check blood glucose level 5 times per day. (Patient not taking: Reported on  04/07/2022) 2 each 6   linaclotide (LINZESS) 72 MCG capsule Take 1 capsule (72 mcg total) by mouth daily before breakfast. For constipation (Patient not taking: Reported on 07/13/2022) 90 capsule 2   No facility-administered medications prior to visit.    Allergies  Allergen Reactions   Ciprofloxacin    Metformin And Related Diarrhea       Objective:    BP 137/70 (BP Location: Left Arm, Patient Position: Sitting, Cuff Size: Normal)   Pulse 69   Ht 5\' 6"  (1.676 m)   Wt 137 lb 6.4 oz (62.3 kg)   SpO2 98%   BMI 22.18 kg/m  Wt Readings from Last 3 Encounters:  07/13/22 137 lb 6.4 oz (62.3 kg)  04/07/22 131 lb 9.6 oz (59.7 kg)  02/03/22 127 lb 9.6 oz (57.9 kg)    Physical Exam Vitals and nursing note reviewed.  Constitutional:      Appearance: She is well-developed.  HENT:     Head: Normocephalic and atraumatic.  Cardiovascular:     Rate and Rhythm: Normal rate and regular rhythm.     Heart sounds: Normal heart sounds. No murmur heard.    No friction rub. No gallop.  Pulmonary:     Effort: Pulmonary effort is normal. No tachypnea or respiratory distress.     Breath sounds: Normal breath sounds. No decreased breath sounds, wheezing, rhonchi or rales.  Chest:     Chest wall: No tenderness.  Abdominal:     General: Bowel sounds are normal.     Palpations: Abdomen is soft.  Musculoskeletal:        General: Normal range of motion.     Cervical back: Normal range of motion.  Skin:    General: Skin is warm and dry.  Neurological:     Mental Status: She is alert and oriented to person, place, and time.     Coordination: Coordination normal.  Psychiatric:        Behavior: Behavior normal. Behavior is cooperative.        Thought Content: Thought content normal.        Judgment: Judgment normal.          Patient has been counseled extensively about nutrition and exercise as well as the importance of adherence with medications and regular follow-up. The patient was given  clear instructions to go to ER or return to medical center if symptoms don't improve, worsen or new problems develop. The patient verbalized understanding.   Follow-up: Return in about 3 months (around 10/13/2022).   Claiborne Rigg, FNP-BC [facility] [place], [place] [phone]   07/13/2022, 9:47 PM

## 2022-07-14 ENCOUNTER — Other Ambulatory Visit: Payer: Self-pay

## 2022-07-14 LAB — CMP14+EGFR
ALT: 67 IU/L — ABNORMAL HIGH (ref 0–32)
AST: 37 IU/L (ref 0–40)
Albumin: 4.2 g/dL (ref 3.8–4.9)
Alkaline Phosphatase: 112 IU/L (ref 44–121)
BUN/Creatinine Ratio: 18 (ref 9–23)
BUN: 11 mg/dL (ref 6–24)
Bilirubin Total: <0.2 mg/dL (ref 0.0–1.2)
CO2: 28 mmol/L (ref 20–29)
Calcium: 9.3 mg/dL (ref 8.7–10.2)
Chloride: 101 mmol/L (ref 96–106)
Creatinine, Ser: 0.62 mg/dL (ref 0.57–1.00)
Globulin, Total: 2.8 g/dL (ref 1.5–4.5)
Glucose: 176 mg/dL — ABNORMAL HIGH (ref 70–99)
Potassium: 4.4 mmol/L (ref 3.5–5.2)
Sodium: 140 mmol/L (ref 134–144)
Total Protein: 7 g/dL (ref 6.0–8.5)
eGFR: 107 mL/min/{1.73_m2} (ref >59)

## 2022-07-29 ENCOUNTER — Other Ambulatory Visit: Payer: Self-pay

## 2022-08-29 ENCOUNTER — Other Ambulatory Visit: Payer: Self-pay | Admitting: Nurse Practitioner

## 2022-08-29 ENCOUNTER — Encounter: Payer: Self-pay | Admitting: Nurse Practitioner

## 2022-08-29 DIAGNOSIS — N941 Unspecified dyspareunia: Secondary | ICD-10-CM

## 2022-10-05 ENCOUNTER — Ambulatory Visit: Payer: 59 | Admitting: Gastroenterology

## 2022-10-05 ENCOUNTER — Encounter: Payer: Self-pay | Admitting: Gastroenterology

## 2022-10-05 ENCOUNTER — Other Ambulatory Visit (INDEPENDENT_AMBULATORY_CARE_PROVIDER_SITE_OTHER): Payer: 59

## 2022-10-05 VITALS — BP 124/80 | HR 78 | Ht 66.0 in | Wt 140.0 lb

## 2022-10-05 DIAGNOSIS — K921 Melena: Secondary | ICD-10-CM | POA: Diagnosis not present

## 2022-10-05 DIAGNOSIS — R978 Other abnormal tumor markers: Secondary | ICD-10-CM | POA: Diagnosis not present

## 2022-10-05 DIAGNOSIS — R748 Abnormal levels of other serum enzymes: Secondary | ICD-10-CM | POA: Diagnosis not present

## 2022-10-05 DIAGNOSIS — K760 Fatty (change of) liver, not elsewhere classified: Secondary | ICD-10-CM | POA: Diagnosis not present

## 2022-10-05 LAB — GAMMA GT: GGT: 22 U/L (ref 7–51)

## 2022-10-05 MED ORDER — NA SULFATE-K SULFATE-MG SULF 17.5-3.13-1.6 GM/177ML PO SOLN: 1.0000 | Freq: Once | ORAL | 0 refills | Status: AC

## 2022-10-05 NOTE — Patient Instructions (Addendum)
You have been scheduled for a colonoscopy. Please follow written instructions given to you at your visit today.   Please pick up your prep supplies at the pharmacy within the next 1-3 days.  If you use inhalers (even only as needed), please bring them with you on the day of your procedure.  DO NOT TAKE 7 DAYS PRIOR TO TEST- Trulicity (dulaglutide) Ozempic, Wegovy (semaglutide) Mounjaro (tirzepatide) Bydureon Bcise (exanatide extended release)  DO NOT TAKE 1 DAY PRIOR TO YOUR TEST Rybelsus (semaglutide) Adlyxin (lixisenatide) Victoza (liraglutide) Byetta (exanatide) ___________________________________________________________________________   You have been scheduled for an abdominal ultrasound with elastography at [facility] (1st floor). Your appointment is scheduled for 10/07/22 at 8:30am. Please arrive 15 minutes prior to your scheduled appointment for registration purposes. Make certain not to have anything to eat or drink 6 hours prior to your procedure. Should you need to reschedule your appointment, you may contact radiology at [phone].  Liver Elastography Various chronic liver diseases such as hepatitis B, C, and fatty liver disease can lead to tissue damage and subsequent scar tissue formation. As the scar tissue accumulates, the liver loses some of its elasticity and becomes stiffer. Liver elastography involves the use of a surface ultrasound probe that delivers a low frequency pulse or shear wave to a small volume of liver tissue under the rib cage. The transmission of the sound wave is completely painless. How Is a Liver Elastography Performed? The liver is located in the right upper abdomen under the rib cage. Patients are asked to lie flat on an examination table. A technician places the FibroScan probe between the ribs on the right side of the lower chest wall. A series of 10 painless pulses are then applied to the liver. The results are recorded on the equipment  and an overall liver stiffness score is generated. This score is then interpreted by a qualified physician to predict the likelihood of advanced fibrosis or cirrhosis.  Patients are asked to wear loose clothing and should not consume any liquids or solids for a minimum of 4 hours before the test to increase the likelihood of obtaining reliable test results. The scan will take 10 to 15 minutes to complete, but patients should plan on being available for 30 minutes to allow time for preparation   Your provider has requested that you go to the basement level for lab work before leaving today. Press "B" on the elevator. The lab is located at the first door on the left as you exit the elevator.  Please purchase Metamucil over the counter. Take as directed.    Due to recent changes in healthcare laws, you may see the results of your imaging and laboratory studies on MyChart before your provider has had a chance to review them.  We understand that in some cases there may be results that are confusing or concerning to you. Not all laboratory results come back in the same time frame and the provider may be waiting for multiple results in order to interpret others.  Please give [place] 48 hours in order for your provider to thoroughly review all the results before contacting the office for clarification of your results.

## 2022-10-05 NOTE — Progress Notes (Signed)
HPI : Denise Hale is a 51 y.o. female with poorly controlled diabetes, anxiety/depression who is referred to [place] by Claiborne Rigg, NP for anemia and weight loss.  This referral was placed back in January 2024.  The patient had to reschedule to previously scheduled GI appointments.  Since the referral was placed, she has regained the weight that she lost.  The patient attributes the weight loss to Trulicity.  She is at her baseline of 140 pounds.  In addition, her anemia was very mild and transient.  Her baseline hemoglobin is 12.  Review of her labs show that she had a hemoglobin of 11.8 in July 2023, otherwise she has no history of anemia.  No prior iron studies available.  She had a negative fecal occult blood test in 2023. She had tumor markers drawn because of reported unintentional weight loss which showed an elevated CA 19-9.  Her most recent hemoglobin from January 05, 2022 is back to her baseline of 12.8.  The patient is concerned because she has been seeing blood in her stool recently.  This has been going on intermittently for several months now.  She sees blood in the toilet water, stool as well as toilet paper.  This does not happen frequently, but occurs maybe several times a month. She denies any other symptoms attributed to hemorrhoids such as perianal pressure, fullness, bulges/protuberances, itching or soiling.  She has a bowel movement most days, but occasionally will have issues with constipation, hard stools or straining.  Diarrhea is not a problem for her.  She has no significant abdominal pain.  She had a partial colectomy from complicated diverticulitis about 15 years ago in [place].  She reports having a colonoscopy since her surgery, but has no idea when or where it would have been performed. No family history of colon cancer.  She has noted to have elevated liver enzymes going back to 2021, sometimes with isolated ALT elevation, symptoms with ALT/ALP elevation. The  patient denies chronic alcohol use.  She drinks alcohol only a few times a year.  She denies any history of more heavy or sustained alcohol use.  She denies any family history of liver disease. She has longstanding poorly controlled diabetes, with her most recent hemoglobin A1c being 11.9 in July. She had a CT of the chest abdomen pelvis to evaluate unintentional weight loss last July which showed fatty infiltration of the liver on my interpretation (not mentioned in the report).    CT Chest, abdomen/pelvis, July 2023 IMPRESSION: 1. No acute or inflammatory process identified in the chest, abdomen, or pelvis. 2. Calcified coronary artery atherosclerosis. 3. Distended urinary bladder (445 mL).  No obstructive uropathy.  On my review of the study, the patient has fatty infiltration of the liver            Total Protein 6.0 - 8.5 g/dL 6.9 6.6 7.7 R 7.4 7.6 7.4   Albumin 3.8 - 4.9 g/dL 3.8 3.9 3.8 R 4.2 4.3 4.3 R   Globulin, Total 1.5 - 4.5 g/dL 3.1 2.7  3.2 3.3 3.1   Albumin/Globulin Ratio 1.2 - 2.2 1.2 1.4  1.3 1.3 1.4   Bilirubin Total 0.0 - 1.2 mg/dL <1.6 <1.0 0.5 R 0.3 0.2 0.4   Alkaline Phosphatase 44 - 121 IU/L 132 High  210 High  221 High  R 154 High  121 108   AST 0 - 40 IU/L 27 36 37 R 49 High  21 24   ALT 0 -  32 IU/L 44 High  52 High  67 High  R 69 High  27 40 High        Past Medical History:  Diagnosis Date   Anxiety    Asthma    Diverticulitis    DM (diabetes mellitus), type 2 with ophthalmic complications (HCC)    GERD (gastroesophageal reflux disease)    Severe nonproliferative diabetic retinopathy (HCC)      Past Surgical History:  Procedure Laterality Date   CARPAL TUNNEL RELEASE Right 04/08/2020   Procedure: RIGHT CARPAL TUNNEL RELEASE;  Surgeon: Tarry Kos, MD;  Location: [facility];  Service: Orthopedics;  Laterality: Right;   CHOLECYSTECTOMY     COLON SURGERY     Family History  Problem Relation Age of Onset   Diabetes Mother     Thyroid disease Mother    Heart disease Father    Breast cancer Paternal Aunt    Breast cancer Paternal Aunt    Social History   Tobacco Use   Smoking status: Some Days    Types: Cigars   Smokeless tobacco: Never  Vaping Use   Vaping status: Former  Substance Use Topics   Alcohol use: Not Currently    Comment: occasionally   Drug use: Yes    Types: Marijuana   Current Outpatient Medications  Medication Sig Dispense Refill   atorvastatin (LIPITOR) 20 MG tablet Take 1 tablet (20 mg total) by mouth daily. FOR CHOLESTEROL 90 tablet 3   Blood Glucose Monitoring Suppl (ONETOUCH VERIO REFLECT) w/Device KIT Check blood glucose level by fingerstick twice per day. Dx: E11.65 1 kit 0   citalopram (CELEXA) 20 MG tablet Take 1 tablet (20 mg total) by mouth daily. For depression 90 tablet 1   Continuous Blood Gluc Receiver (FREESTYLE LIBRE 2 READER) DEVI Check blood glucose level 5 times per day. (Patient not taking: Reported on 07/13/2022) 1 each 0   Continuous Blood Gluc Sensor (FREESTYLE LIBRE 2 SENSOR) MISC Check blood glucose level 5 times per day. (Patient not taking: Reported on 04/07/2022) 2 each 6   Docusate Sodium (DSS) 100 MG CAPS Take by mouth.     fluconazole (DIFLUCAN) 150 MG tablet Take 1 tablet every 72 hours for 3 doses then take 1 tablet weekly for the next 6 months. 27 tablet 0   gabapentin (NEURONTIN) 600 MG tablet Take 1 tablet (600 mg total) by mouth 3 (three) times daily. 90 tablet 3   glipiZIDE (GLIPIZIDE XL) 10 MG 24 hr tablet Take 1 tablet (10 mg total) by mouth daily with breakfast. 90 tablet 1   Insulin Lispro Prot & Lispro (HUMALOG MIX 75/25 KWIKPEN) (75-25) 100 UNIT/ML Kwikpen Inject 30 Units into the skin 2 (two) times daily. 15 mL 3   lansoprazole (PREVACID) 30 MG capsule Take 1 capsule (30 mg total) by mouth daily at 12 noon. FOR ACID REFLUX 90 capsule 1   linaclotide (LINZESS) 72 MCG capsule Take 1 capsule (72 mcg total) by mouth daily before breakfast. For  constipation (Patient not taking: Reported on 07/13/2022) 90 capsule 2   lisinopril (ZESTRIL) 5 MG tablet Take 1 tablet (5 mg total) by mouth daily. 90 tablet 3   nystatin-triamcinolone ointment (MYCOLOG) APPLY THIN FILM TO EXTERNAL VAGINAL AREA DAILY X 5 DAYS. DO NOT INSERT INTO VAGINA 30 g 0   ondansetron (ZOFRAN-ODT) 4 MG disintegrating tablet Take 1 tablet (4 mg total) by mouth every 8 (eight) hours as needed for nausea or vomiting. 60 tablet 1  tiZANidine (ZANAFLEX) 4 MG tablet Take 1 tablet (4 mg total) by mouth every 6 (six) hours as needed for muscle spasms. 90 tablet 3   TRUEplus Lancets 28G MISC Use as instructed. Check blood glucose level by fingerstick twice per day.   E11.65 100 each 3   No current facility-administered medications for this visit.   Allergies  Allergen Reactions   Ciprofloxacin    Metformin And Related Diarrhea     Review of Systems: All systems reviewed and negative except where noted in HPI.    No results found.  Physical Exam: BP 124/80   Pulse 78   Ht 5\' 6"  (1.676 m)   Wt 140 lb (63.5 kg)   BMI 22.60 kg/m  Constitutional: Pleasant,well-developed, African-American female in no acute distress. HEENT: Normocephalic and atraumatic. Conjunctivae are normal. No scleral icterus. Neck supple.  Cardiovascular: Normal rate, regular rhythm.  Pulmonary/chest: Effort normal and breath sounds normal. No wheezing, rales or rhonchi. Abdominal: Soft, nondistended, nontender. Bowel sounds active throughout. There are no masses palpable. No hepatomegaly. Extremities: no edema Neurological: Alert and oriented to person place and time. Skin: Skin is warm and dry. No rashes noted. Psychiatric: Normal mood and affect. Behavior is normal.  CBC    Component Value Date/Time   WBC 7.0 01/05/2022 1210   WBC 7.6 07/06/2021 0329   RBC 4.35 01/05/2022 1210   RBC 3.92 07/06/2021 0329   HGB 12.8 01/05/2022 1210   HCT 38.6 01/05/2022 1210   PLT 332 01/05/2022 1210    MCV 89 01/05/2022 1210   MCH 29.4 01/05/2022 1210   MCH 30.1 07/06/2021 0329   MCHC 33.2 01/05/2022 1210   MCHC 33.1 07/06/2021 0329   RDW 12.6 01/05/2022 1210   LYMPHSABS 3.5 (H) 01/05/2022 1210   MONOABS 0.6 07/06/2021 0329   EOSABS 0.3 01/05/2022 1210   BASOSABS 0.0 01/05/2022 1210    CMP     Component Value Date/Time   NA 140 07/13/2022 0918   K 4.4 07/13/2022 0918   CL 101 07/13/2022 0918   CO2 28 07/13/2022 0918   GLUCOSE 176 (H) 07/13/2022 0918   GLUCOSE 406 (H) 07/06/2021 0329   BUN 11 07/13/2022 0918   CREATININE 0.62 07/13/2022 0918   CALCIUM 9.3 07/13/2022 0918   PROT 7.0 07/13/2022 0918   ALBUMIN 4.2 07/13/2022 0918   AST 37 07/13/2022 0918   ALT 67 (H) 07/13/2022 0918   ALKPHOS 112 07/13/2022 0918   BILITOT <0.2 07/13/2022 0918   GFRNONAA >60 07/06/2021 0329   GFRAA 120 02/27/2020 0946       Latest Ref Rng & Units 01/05/2022   12:10 PM 07/06/2021    3:29 AM 02/10/2021   12:07 PM  CBC EXTENDED  WBC 3.4 - 10.8 x10E3/uL 7.0  7.6  5.2   RBC 3.77 - 5.28 x10E6/uL 4.35  3.92  4.32   Hemoglobin 11.1 - 15.9 g/dL 60.1  09.3  23.5   HCT 34.0 - 46.6 % 38.6  35.6  38.7   Platelets 150 - 450 x10E3/uL 332  318  354   NEUT# 1.4 - 7.0 x10E3/uL 2.7  3.8    Lymph# 0.7 - 3.1 x10E3/uL 3.5  3.0        ASSESSMENT AND PLAN: 52 year old female with poorly controlled diabetes, initially referred to GI in January 2020 for for anemia and unintentional weight loss.  Both problems have resolved spontaneously.  Patient attributes weight loss to Trulicity.  The patient's anemia was questionable, with a hemoglobin of  11.8 in July, now back to her baseline 12.8.  No iron studies were performed at the time of the referral.  No further evaluation is recommended for weight loss or anemia. The patient does have recurrent painless hematochezia over the past few months, which seems most consistent with hemorrhoidal bleeding.  However, the patient does not know when her last colonoscopy was and  given the new onset hematochezia, a colonoscopy is indicated.  The patient has chronically elevated liver enzymes and has evidence of steatosis on CT scan from last year.  She has poorly controlled diabetes, so I am highly suspicious for MASLD as the cause for fatty liver/elevated liver enzymes.  We will exclude other causes of chronic liver disease (viral, autoimmune, genetic/metabolic) and obtain ultrasound with elastography.  We discussed the potential risk of significant liver disease with fatty liver to include cirrhosis, and the management of MASLD which focuses on weight loss through diet and exercise.  If she does have significant fibrosis on elastography, then we could consider starting her on Rezdiffra.  A CA 19-9 was checked in January by the patient's PCP and was elevated.  A CT scan in July 2023 did not show any suspicious pancreatic mass or other suspicious lesion.  As the patient has gained over 10 pounds since the 19-9 level was obtained 10 months ago, it is extremely unlikely that the elevated 19-9 is related to a malignancy.  Will plan to repeat a level today.  If it has increased, we can consider repeating a CT scan.  Rectal bleeding,  - Likely hemorrhoidal, but colonoscopy indicated to exclude other etiologies.  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   [place] Cu E. Tomasa Rand, MD [facility]  I spent a total of 40 minutes reviewing the patient's medical record, interviewing and  examining the patient, discussing her diagnosis and management of her condition going forward, and documenting in the medical record   Claiborne Rigg, NP

## 2022-10-06 LAB — HEPATITIS C ANTIBODY: Hepatitis C Ab: NONREACTIVE

## 2022-10-06 LAB — CA 19-9 (SERIAL): CA 19-9: 81 U/mL — ABNORMAL HIGH (ref 0–35)

## 2022-10-07 ENCOUNTER — Ambulatory Visit (HOSPITAL_COMMUNITY): Payer: 59

## 2022-10-08 LAB — TISSUE TRANSGLUTAMINASE, IGA: (tTG) Ab, IgA: <1 U/mL

## 2022-10-08 LAB — ALPHA-1-ANTITRYPSIN: A-1 Antitrypsin, Ser: 162 mg/dL (ref 83–199)

## 2022-10-08 LAB — HEPATITIS B SURFACE ANTIGEN: Hepatitis B Surface Ag: NONREACTIVE

## 2022-10-08 LAB — HEPATITIS B SURFACE ANTIBODY,QUALITATIVE: Hep B S Ab: REACTIVE — AB

## 2022-10-08 LAB — IGA: Immunoglobulin A: 269 mg/dL (ref 47–310)

## 2022-10-08 LAB — ANTI-SMOOTH MUSCLE ANTIBODY, IGG: Actin (Smooth Muscle) Antibody (IGG): <20 U (ref <20)

## 2022-10-08 LAB — IGG: IgG (Immunoglobin G), Serum: 1634 mg/dL (ref 600–1640)

## 2022-10-08 LAB — MITOCHONDRIAL ANTIBODIES: Mitochondrial M2 Ab, IgG: <=20 U (ref <=20.0)

## 2022-10-11 NOTE — Progress Notes (Signed)
Ms. Whitener,  The tests looking for other causes of chronic liver disease were negative (normal).  Your tests indicate you are immune to hepatitis B through vaccination. Will await results of your ultrasound.

## 2022-10-12 ENCOUNTER — Ambulatory Visit (HOSPITAL_COMMUNITY): Payer: 59

## 2022-10-12 ENCOUNTER — Other Ambulatory Visit: Payer: Self-pay

## 2022-10-14 ENCOUNTER — Ambulatory Visit: Payer: Medicaid Other | Admitting: Nurse Practitioner

## 2022-10-14 ENCOUNTER — Other Ambulatory Visit: Payer: Self-pay

## 2022-10-17 ENCOUNTER — Ambulatory Visit (HOSPITAL_COMMUNITY)
Admission: RE | Admit: 2022-10-17 | Discharge: 2022-10-17 | Disposition: A | Discharge status: Home / Self Care | Payer: 59 | Source: Ambulatory Visit | Attending: Gastroenterology | Admitting: Gastroenterology

## 2022-10-17 DIAGNOSIS — K760 Fatty (change of) liver, not elsewhere classified: Secondary | ICD-10-CM | POA: Diagnosis not present

## 2022-10-25 ENCOUNTER — Other Ambulatory Visit: Payer: Self-pay | Admitting: Nurse Practitioner

## 2022-10-25 ENCOUNTER — Encounter: Payer: Self-pay | Admitting: Gastroenterology

## 2022-10-25 DIAGNOSIS — K219 Gastro-esophageal reflux disease without esophagitis: Secondary | ICD-10-CM

## 2022-10-26 NOTE — Progress Notes (Signed)
Denise Hale, Your ultrasound did not show any significant fatty changes to your liver. The elastography showed an intermediate level of liver stiffness, indicating likely some mild scarring, but not significant at this point. I would recommend we repeat your liver enzymes in 6 months.  And consider repeating elastography in 1 to 2 years.

## 2022-10-27 ENCOUNTER — Other Ambulatory Visit (HOSPITAL_COMMUNITY): Payer: Self-pay

## 2022-10-27 MED ORDER — LANSOPRAZOLE 30 MG PO CPDR
30.0000 mg | DELAYED_RELEASE_CAPSULE | Freq: Every day | ORAL | 1 refills | Status: DC
  Filled 2022-10-27: qty 30, 30d supply, fill #0
  Filled 2022-12-06: qty 30, 30d supply, fill #1
  Filled 2023-01-05: qty 30, 30d supply, fill #2

## 2022-10-27 NOTE — Telephone Encounter (Signed)
Labs in date  Requested Prescriptions  Pending Prescriptions Disp Refills   lansoprazole (PREVACID) 30 MG capsule 90 capsule 1    Sig: Take 1 capsule (30 mg total) by mouth daily at 12 noon. FOR ACID REFLUX     Gastroenterology: Proton Pump Inhibitors 2 Failed - 10/25/2022  3:09 PM      Failed - ALT in normal range and within 360 days    ALT  Date Value Ref Range Status  07/13/2022 67 (H) 0 - 32 IU/L Final         Passed - AST in normal range and within 360 days    AST  Date Value Ref Range Status  07/13/2022 37 0 - 40 IU/L Final         Passed - Valid encounter within last 12 months    Recent Outpatient Visits           3 months ago Type 2 diabetes mellitus with diabetic peripheral angiopathy without gangrene, without long-term current use of insulin [facility])   [place] [facility] [place], Shea Stakes, NP   6 months ago Primary hypertension   [place] [facility] [place], [place] W, NP   8 months ago Acute non-recurrent maxillary sinusitis   [place] [facility] [place], Odette Horns, MD   9 months ago Type 2 diabetes mellitus with diabetic peripheral angiopathy without gangrene, without long-term current use of insulin [facility])   [place] [facility] [place], [place] W, NP   1 year ago Type 2 diabetes mellitus with diabetic peripheral angiopathy without gangrene, without long-term current use of insulin [facility])   [place] [facility] [place], Shea Stakes, NP       Future Appointments             In 2 weeks Shamleffer, Konrad Dolores, MD [facility] Endocrinology   In 2 weeks Claiborne Rigg, NP [facility]

## 2022-10-28 ENCOUNTER — Other Ambulatory Visit (HOSPITAL_COMMUNITY): Payer: Self-pay

## 2022-10-31 ENCOUNTER — Other Ambulatory Visit (HOSPITAL_COMMUNITY): Payer: Self-pay

## 2022-10-31 ENCOUNTER — Other Ambulatory Visit: Payer: Self-pay

## 2022-11-08 ENCOUNTER — Encounter: Payer: 59 | Admitting: Gastroenterology

## 2022-11-10 ENCOUNTER — Encounter: Payer: Self-pay | Admitting: Internal Medicine

## 2022-11-10 ENCOUNTER — Other Ambulatory Visit: Payer: Self-pay

## 2022-11-10 ENCOUNTER — Ambulatory Visit: Payer: 59 | Admitting: Internal Medicine

## 2022-11-10 VITALS — BP 126/80 | HR 61 | Ht 66.0 in | Wt 142.0 lb

## 2022-11-10 DIAGNOSIS — E1165 Type 2 diabetes mellitus with hyperglycemia: Secondary | ICD-10-CM | POA: Diagnosis not present

## 2022-11-10 DIAGNOSIS — E113499 Type 2 diabetes mellitus with severe nonproliferative diabetic retinopathy without macular edema, unspecified eye: Secondary | ICD-10-CM

## 2022-11-10 DIAGNOSIS — E1142 Type 2 diabetes mellitus with diabetic polyneuropathy: Secondary | ICD-10-CM

## 2022-11-10 DIAGNOSIS — Z794 Long term (current) use of insulin: Secondary | ICD-10-CM

## 2022-11-10 LAB — POCT GLYCOSYLATED HEMOGLOBIN (HGB A1C): Hemoglobin A1C: 11.7 % — AB (ref 4.0–5.6)

## 2022-11-10 MED ORDER — NOVOLOG FLEXPEN 100 UNIT/ML ~~LOC~~ SOPN
PEN_INJECTOR | SUBCUTANEOUS | 2 refills | Status: AC
  Filled 2022-11-10: qty 9, 30d supply, fill #0
  Filled 2023-01-05: qty 30, 28d supply, fill #0
  Filled 2023-02-10: qty 27, 90d supply, fill #0

## 2022-11-10 MED ORDER — FREESTYLE LIBRE 3 SENSOR MISC
1.0000 | 3 refills | Status: DC
  Filled 2022-11-10 – 2023-01-05 (×2): qty 2, 28d supply, fill #0

## 2022-11-10 MED ORDER — INSULIN PEN NEEDLE 32G X 4 MM MISC
1.0000 | Freq: Four times a day (QID) | 3 refills | Status: DC
Start: 2022-11-10 — End: 2023-11-17
  Filled 2022-11-10 – 2023-01-05 (×2): qty 100, 25d supply, fill #0

## 2022-11-10 MED ORDER — LANTUS SOLOSTAR 100 UNIT/ML ~~LOC~~ SOPN
40.0000 [IU] | PEN_INJECTOR | Freq: Every day | SUBCUTANEOUS | 3 refills | Status: DC
Start: 2022-11-10 — End: 2023-11-17
  Filled 2022-11-10: qty 12, 30d supply, fill #0
  Filled 2023-01-05 – 2023-03-16 (×3): qty 36, 90d supply, fill #0
  Filled 2023-06-22 – 2023-09-25 (×2): qty 36, 90d supply, fill #1

## 2022-11-10 MED ORDER — EMPAGLIFLOZIN 10 MG PO TABS
10.0000 mg | ORAL_TABLET | Freq: Every day | ORAL | 3 refills | Status: DC
  Filled 2022-11-10: qty 90, 90d supply, fill #0
  Filled 2022-11-10: qty 30, 30d supply, fill #0
  Filled 2022-12-06 – 2023-03-16 (×4): qty 90, 90d supply, fill #0

## 2022-11-10 NOTE — Progress Notes (Signed)
Name: Denise Hale  MRN/ DOB: [id number], Nov 12, 1970   Age/ Sex: 52 y.o., female    PCP: Claiborne Rigg, NP   Reason for Endocrinology Evaluation: Type 2 Diabetes Mellitus     Date of Initial Endocrinology Visit: 11/10/2022     PATIENT IDENTIFIER: [name] is a 52 y.o. female with a past medical history of DM, . The patient presented for initial endocrinology clinic visit on 11/10/2022 for consultative assistance with her diabetes management.    HPI: Ms. Gafford was    Diagnosed with DM 16 yrs ago  Prior Medications tried/Intolerance: Metformin-diarrhea, tradjenta - no intolerance. Trulicity - extreme weight loss and GI issues  Currently checking blood sugars 2 x / day.  Hypoglycemia episodes : yes               Symptoms: yes                  Hemoglobin A1c has ranged from 6.6% in 2023, peaking at 12.3% in 2024.   In terms of diet, the patient 2 meals a day, snacks occasionally, Consumes sugar-sweetened beverages   NO pancreatitis   Denies nausea or  vomiting recently Has occasional constipation alternating with diarrhea     HOME DIABETES REGIMEN: Glipizide XL 10 mg daily Humalog mix 75/25 30 units BID    Statin: yes ACE-I/ARB: Yes   METER DOWNLOAD SUMMARY:  59-317  90 day average 269 mg/dL    DIABETIC COMPLICATIONS: Microvascular complications:  Neuropathy, DR  Denies: CKD Last eye exam: Completed 11/2020  Macrovascular complications:   Denies: CAD, PVD, CVA   PAST HISTORY: Past Medical History:  Past Medical History:  Diagnosis Date   Anxiety    Asthma    Diverticulitis    DM (diabetes mellitus), type 2 with ophthalmic complications (HCC)    GERD (gastroesophageal reflux disease)    Severe nonproliferative diabetic retinopathy (HCC)    Past Surgical History:  Past Surgical History:  Procedure Laterality Date   CARPAL TUNNEL RELEASE Right 04/08/2020   Procedure: RIGHT CARPAL TUNNEL RELEASE;  Surgeon: Tarry Kos, MD;  Location: [facility];  Service: Orthopedics;  Laterality: Right;   CHOLECYSTECTOMY     COLON SURGERY      Social History:  reports that she has been smoking cigars. She has never used smokeless tobacco. She reports that she does not currently use alcohol. She reports current drug use. Drug: Marijuana. Family History:  Family History  Problem Relation Age of Onset   Diabetes Mother    Thyroid disease Mother    Heart disease Father    Breast cancer Paternal Aunt    Breast cancer Paternal Aunt      HOME MEDICATIONS: Allergies as of 11/10/2022       Reactions   Ciprofloxacin    Metformin And Related Diarrhea        Medication List        Accurate as of November 10, 2022  9:09 AM. If you have any questions, ask your nurse or doctor.          atorvastatin 20 MG tablet Commonly known as: LIPITOR Take 1 tablet (20 mg total) by mouth daily. FOR CHOLESTEROL   citalopram 20 MG tablet Commonly known as: CELEXA Take 1 tablet (20 mg total) by mouth daily. For depression   Cymbalta 30 MG capsule Generic drug: DULoxetine Take 1 capsule every day by oral route.   DSS 100 MG Caps Take by mouth.  fluconazole 150 MG tablet Commonly known as: DIFLUCAN Take 1 tablet every 72 hours for 3 doses then take 1 tablet weekly for the next 6 months.   FreeStyle Libre 2 Reader [facility] Check blood glucose level 5 times per day.   FreeStyle Libre 2 Sensor Misc Check blood glucose level 5 times per day.   gabapentin 600 MG tablet Commonly known as: Neurontin Take 1 tablet (600 mg total) by mouth 3 (three) times daily.   glipiZIDE 10 MG 24 hr tablet Commonly known as: glipiZIDE XL Take 1 tablet (10 mg total) by mouth daily with breakfast.   HumaLOG Mix 75/25 KwikPen (75-25) 100 UNIT/ML KwikPen Generic drug: Insulin Lispro Prot & Lispro Inject 30 Units into the skin 2 (two) times daily.   HYDROcodone-acetaminophen 5-325 MG tablet Commonly known as: NORCO/VICODIN Take by mouth.    lansoprazole 30 MG capsule Commonly known as: PREVACID Take 1 capsule (30 mg total) by mouth daily at 12 noon. FOR ACID REFLUX   linaclotide 72 MCG capsule Commonly known as: Linzess Take 1 capsule (72 mcg total) by mouth daily before breakfast. For constipation   lisinopril 2.5 MG tablet Commonly known as: ZESTRIL Take by mouth.   lisinopril 5 MG tablet Commonly known as: ZESTRIL Take 1 tablet (5 mg total) by mouth daily.   Na Sulfate-K Sulfate-Mg Sulf 17.5-3.13-1.6 GM/177ML Soln See admin instructions.   nystatin-triamcinolone ointment Commonly known as: MYCOLOG APPLY THIN FILM TO EXTERNAL VAGINAL AREA DAILY X 5 DAYS. DO NOT INSERT INTO VAGINA   ondansetron 4 MG disintegrating tablet Commonly known as: ZOFRAN-ODT Take 1 tablet (4 mg total) by mouth every 8 (eight) hours as needed for nausea or vomiting.   OneTouch Verio Reflect w/Device Kit Check blood glucose level by fingerstick twice per day. Dx: E11.65   pantoprazole 40 MG tablet Commonly known as: PROTONIX Take 1 tablet every day by oral route.   tiZANidine 4 MG tablet Commonly known as: ZANAFLEX Take 1 tablet (4 mg total) by mouth every 6 (six) hours as needed for muscle spasms.   Tradjenta 5 MG Tabs tablet Generic drug: linagliptin Take 1 tablet every day by oral route.   TRUEplus Lancets 28G Misc Use as instructed. Check blood glucose level by fingerstick twice per day.   E11.65         ALLERGIES: Allergies  Allergen Reactions   Ciprofloxacin    Metformin And Related Diarrhea     REVIEW OF SYSTEMS: A comprehensive ROS was conducted with the patient and is negative except as per HPI    OBJECTIVE:   VITAL SIGNS: BP 126/80 (BP Location: Right Arm, Patient Position: Sitting, Cuff Size: Small)   Pulse 61   Ht 5\' 6"  (1.676 m)   Wt 142 lb (64.4 kg)   SpO2 97%   BMI 22.92 kg/m    PHYSICAL EXAM:  General: Pt appears well and is in NAD  Neck: General: Supple without adenopathy or carotid  bruits. Thyroid: Thyroid size normal.  No goiter or nodules appreciated.   Lungs: Clear with good BS bilat   Heart: RRR   Abdomen:  soft, nontender  Extremities:  Lower extremities - No pretibial edema.   Skin:  No rash noted.  Neuro: MS is good with appropriate affect, pt is alert and Ox3    DM foot exam: 11/10/2022  The skin of the feet is intact without sores or ulcerations. The pedal pulses are 2+ on right and 2+ on left. The sensation is intact to a  screening 5.07, 10 gram monofilament bilaterally    DATA REVIEWED:  Lab Results  Component Value Date   HGBA1C 11.9 (A) 07/13/2022   HGBA1C 12.3 (A) 04/07/2022   HGBA1C >15 01/05/2022    Latest Reference Range & Units 07/13/22 09:18  Sodium 134 - 144 mmol/L 140  Potassium 3.5 - 5.2 mmol/L 4.4  Chloride 96 - 106 mmol/L 101  CO2 20 - 29 mmol/L 28  Glucose 70 - 99 mg/dL 725 (H)  BUN 6 - 24 mg/dL 11  Creatinine 3.66 - 4.40 mg/dL 3.47  Calcium 8.7 - 42.5 mg/dL 9.3  BUN/Creatinine Ratio 9 - 23  18  eGFR >59 mL/min/1.73 107  Alkaline Phosphatase 44 - 121 IU/L 112  Albumin 3.8 - 4.9 g/dL 4.2  AST 0 - 40 IU/L 37  ALT 0 - 32 IU/L 67 (H)  Total Protein 6.0 - 8.5 g/dL 7.0  Total Bilirubin 0.0 - 1.2 mg/dL <9.5  (H): Data is abnormally high  ASSESSMENT / PLAN / RECOMMENDATIONS:   1) Type 2 Diabetes Mellitus, Poorly controlled, With Neuropathic and retinopathic  complications - Most recent A1c of 11.7 %. Goal A1c < 7.0 %.    Plan: GENERAL: I have discussed with the patient the pathophysiology of diabetes. We went over the natural progression of the disease. We stressed the importance of lifestyle changes. I explained the complications associated with diabetes including retinopathy, nephropathy, neuropathy as well as increased risk of cardiovascular disease. We went over the benefit seen with glycemic control.   I explained to the patient that diabetic patients are at higher than normal risk for amputations.  Intolerant to  metformin Intolerant to [employer] as well as did not like extreme weight loss I have recommended discontinuing glipizide due to increased risk of hypoglycemia I have also recommended starting Jardiance, caution against genital infections I have recommended switching insulin mix to basal/prandial insulin, as she is not consistently eating breakfast and supper, for example today she took her insulin but did not eat anything yet She also be provided with a correction scale for prandial insulin A prescription for freestyle libre 3 has been sent to the pharmacy  MEDICATIONS: Stop glipizide Stop Humalog Mix Start Jardiance 10 mg daily Start Lantus 40 units daily Start NovoLog 6 units 3 times daily before every meal Start CF: Novolog (BG-130/30) TIDQAC  EDUCATION / INSTRUCTIONS: BG monitoring instructions: Patient is instructed to check her blood sugars 3 times a day. Call [facility] if: BG persistently < 70  I reviewed the Rule of 15 for the treatment of hypoglycemia in detail with the patient. Literature supplied.   2) Diabetic complications:  Eye: Does  have known diabetic retinopathy.  Neuro/ Feet: Does  have known diabetic peripheral neuropathy. Renal: Patient does not have known baseline CKD. She is  on an ACEI/ARB at present.     Follow-up in 3 months  Signed electronically by: Lyndle Herrlich, MD  [facility]ology  [facility] [address] [place] [place], [place] [address] Phone: [phone] FAX: [phone]   CC: Claiborne Rigg, NP [address] [place] [address] [place] [address] Phone: [phone]  Fax: [phone]    Return to Endocrinology clinic as below: Future Appointments  Date Time Provider Department Center  11/16/2022  9:30 AM Claiborne Rigg, NP [id number] None  12/15/2022  1:30 PM Jenel Lucks, MD [facility]

## 2022-11-10 NOTE — Patient Instructions (Addendum)
STOP Glipizide  START Jardiance 10 mg, 1 tablet every morning  Start Lantus 40 units ONCE daily  Start Novolog 6 units with each meal  Novolog correctional insulin: ADD extra units on insulin to your meal-time Novolog dose if your blood sugars are higher than 160. Use the scale below to help guide you:   Blood sugar before meal Number of units to inject  Less than 160 0 unit  161 -  190 1 units  191 -  220 2 units  221 -  250 3 units  251 -  280 4 units  281 -  310 5 units  311 -  340 6 units  341 -  370 7 units  371 -  400 8 units    HOW TO TREAT LOW BLOOD SUGARS (Blood sugar LESS THAN 70 MG/DL) Please follow the RULE OF 15 for the treatment of hypoglycemia treatment (when your (blood sugars are less than 70 mg/dL)   STEP 1: Take 15 grams of carbohydrates when your blood sugar is low, which includes:  3-4 GLUCOSE TABS  OR 3-4 OZ OF JUICE OR REGULAR SODA OR ONE TUBE OF GLUCOSE GEL    STEP 2: RECHECK blood sugar in 15 MINUTES STEP 3: If your blood sugar is still low at the 15 minute recheck --> then, go back to STEP 1 and treat AGAIN with another 15 grams of carbohydrates.

## 2022-11-15 ENCOUNTER — Other Ambulatory Visit: Payer: Self-pay

## 2022-11-16 ENCOUNTER — Other Ambulatory Visit: Payer: Self-pay

## 2022-11-16 ENCOUNTER — Encounter: Payer: Self-pay | Admitting: Nurse Practitioner

## 2022-11-16 ENCOUNTER — Ambulatory Visit: Payer: 59 | Attending: Nurse Practitioner | Admitting: Nurse Practitioner

## 2022-11-16 VITALS — BP 122/71 | HR 66 | Ht 66.0 in | Wt 140.8 lb

## 2022-11-16 DIAGNOSIS — K047 Periapical abscess without sinus: Secondary | ICD-10-CM

## 2022-11-16 DIAGNOSIS — Z7984 Long term (current) use of oral hypoglycemic drugs: Secondary | ICD-10-CM

## 2022-11-16 DIAGNOSIS — Z23 Encounter for immunization: Secondary | ICD-10-CM

## 2022-11-16 DIAGNOSIS — E1151 Type 2 diabetes mellitus with diabetic peripheral angiopathy without gangrene: Secondary | ICD-10-CM

## 2022-11-16 MED ORDER — CHLORHEXIDINE GLUCONATE 0.12 % MT SOLN
15.0000 mL | Freq: Two times a day (BID) | OROMUCOSAL | 1 refills | Status: AC
Start: 2022-11-16 — End: ?
  Filled 2022-11-16: qty 473, 16d supply, fill #0

## 2022-11-16 MED ORDER — PENICILLIN V POTASSIUM 500 MG PO TABS
500.0000 mg | ORAL_TABLET | Freq: Four times a day (QID) | ORAL | 0 refills | Status: AC
Start: 2022-11-16 — End: 2022-11-23
  Filled 2022-11-16: qty 28, 7d supply, fill #0

## 2022-11-16 NOTE — Progress Notes (Signed)
Assessment & Plan:  Denise Hale was seen today for medical management of chronic issues.  Diagnoses and all orders for this visit:  Dental infection -     chlorhexidine (PERIDEX) 0.12 % solution; Use as directed 15 mLs in the mouth or throat 2 (two) times daily. -     penicillin v potassium (VEETID) 500 MG tablet; Take 1 tablet (500 mg total) by mouth 4 (four) times daily for 7 days.  Type 2 diabetes mellitus with diabetic peripheral angiopathy without gangrene, without long-term current use of insulin (HCC) Follow up with endo. Pick up diabetes medications from pharmacy today   Patient has been counseled on age-appropriate routine health concerns for screening and prevention. These are reviewed and up-to-date. Referrals have been placed accordingly. Immunizations are up-to-date or declined.    Subjective:   Chief Complaint  Patient presents with   Medical Management of Chronic Issues    Denise Hale 52 y.o. female presents to office today for follow up to dental pain.  She has a past medical history of Anxiety, Asthma, DM2, Diverticulitis, GERD, peripheral neuropathy, HTN, and Severe nonproliferative diabetic retinopathy.     She is not seeing ENDO for her diabetes. But states she recently lost her insurance and will not be able to afford the copays for office visits.   She has poor dentition, periodontal diease and numerous fractured teeth. Now with pain in the right side of her mouth and along the right jaw. Associated symptoms: headache. States she can not afford to go to the dentist.    Blood pressure is well controlled. She is taking renal dose ACE BP Readings from Last 3 Encounters:  11/16/22 122/71  11/10/22 126/80  10/05/22 124/80    DM  She has not picked up her diabetes medications due to cost. She is currently uninsured. I have instructed her to speak with the pharmacy for patient assistance.  Lab Results  Component Value Date   HGBA1C 11.7 (A) 11/10/2022      ROS  Past Medical History:  Diagnosis Date   Anxiety    Asthma    Diverticulitis    DM (diabetes mellitus), type 2 with ophthalmic complications (HCC)    GERD (gastroesophageal reflux disease)    Severe nonproliferative diabetic retinopathy (HCC)     Past Surgical History:  Procedure Laterality Date   CARPAL TUNNEL RELEASE Right 04/08/2020   Procedure: RIGHT CARPAL TUNNEL RELEASE;  Surgeon: Tarry Kos, MD;  Location: [facility];  Service: Orthopedics;  Laterality: Right;   CHOLECYSTECTOMY     COLON SURGERY      Family History  Problem Relation Age of Onset   Diabetes Mother    Thyroid disease Mother    Heart disease Father    Breast cancer Paternal Aunt    Breast cancer Paternal Aunt     Social History Reviewed with no changes to be made today.   Outpatient Medications Prior to Visit  Medication Sig Dispense Refill   atorvastatin (LIPITOR) 20 MG tablet Take 1 tablet (20 mg total) by mouth daily. FOR CHOLESTEROL 90 tablet 3   Blood Glucose Monitoring Suppl (ONETOUCH VERIO REFLECT) w/Device KIT Check blood glucose level by fingerstick twice per day. Dx: E11.65 1 kit 0   citalopram (CELEXA) 20 MG tablet Take 1 tablet (20 mg total) by mouth daily. For depression 90 tablet 1   DULoxetine (CYMBALTA) 30 MG capsule Take 1 capsule every day by oral route.     fluconazole (DIFLUCAN) 150  MG tablet Take 1 tablet every 72 hours for 3 doses then take 1 tablet weekly for the next 6 months. 27 tablet 0   gabapentin (NEURONTIN) 600 MG tablet Take 1 tablet (600 mg total) by mouth 3 (three) times daily. 90 tablet 3   lansoprazole (PREVACID) 30 MG capsule Take 1 capsule (30 mg total) by mouth daily at 12 noon. FOR ACID REFLUX 90 capsule 1   lisinopril (ZESTRIL) 5 MG tablet Take 1 tablet (5 mg total) by mouth daily. 90 tablet 3   Na Sulfate-K Sulfate-Mg Sulf 17.5-3.13-1.6 GM/177ML SOLN See admin instructions.     nystatin-triamcinolone ointment (MYCOLOG) APPLY THIN FILM TO  EXTERNAL VAGINAL AREA DAILY X 5 DAYS. DO NOT INSERT INTO VAGINA 30 g 0   ondansetron (ZOFRAN-ODT) 4 MG disintegrating tablet Take 1 tablet (4 mg total) by mouth every 8 (eight) hours as needed for nausea or vomiting. 60 tablet 1   pantoprazole (PROTONIX) 40 MG tablet Take 1 tablet every day by oral route.     tiZANidine (ZANAFLEX) 4 MG tablet Take 1 tablet (4 mg total) by mouth every 6 (six) hours as needed for muscle spasms. 90 tablet 3   TRUEplus Lancets 28G MISC Use as instructed. Check blood glucose level by fingerstick twice per day.   E11.65 100 each 3   Continuous Glucose Sensor (FREESTYLE LIBRE 3 SENSOR) MISC use as directed change every every 14 (fourteen) days. (Patient not taking: Reported on 11/16/2022) 6 each 3   Docusate Sodium (DSS) 100 MG CAPS Take by mouth. (Patient not taking: Reported on 11/16/2022)     empagliflozin (JARDIANCE) 10 MG TABS tablet Take 1 tablet (10 mg total) by mouth daily before breakfast. (Patient not taking: Reported on 11/16/2022) 90 tablet 3   insulin aspart (NOVOLOG FLEXPEN) 100 UNIT/ML FlexPen inject a max of 30 unitd daily as directed (Patient not taking: Reported on 11/16/2022) 30 mL 2   insulin glargine (LANTUS SOLOSTAR) 100 UNIT/ML Solostar Pen Inject 40 Units into the skin daily. (Patient not taking: Reported on 11/16/2022) 45 mL 3   Insulin Pen Needle 32G X 4 MM MISC Use in the morning, at noon, in the evening, and at bedtime. (Patient not taking: Reported on 11/16/2022) 400 each 3   linaclotide (LINZESS) 72 MCG capsule Take 1 capsule (72 mcg total) by mouth daily before breakfast. For constipation (Patient not taking: Reported on 11/16/2022) 90 capsule 2   lisinopril (ZESTRIL) 2.5 MG tablet Take by mouth. (Patient not taking: Reported on 11/16/2022)     HYDROcodone-acetaminophen (NORCO/VICODIN) 5-325 MG tablet Take by mouth. (Patient not taking: Reported on 11/16/2022)     No facility-administered medications prior to visit.    Allergies  Allergen  Reactions   Ciprofloxacin    Metformin And Related Diarrhea       Objective:    BP 122/71 (BP Location: Left Arm, Patient Position: Sitting, Cuff Size: Normal)   Pulse 66   Ht 5\' 6"  (1.676 m)   Wt 140 lb 12.8 oz (63.9 kg)   SpO2 100%   BMI 22.73 kg/m  Wt Readings from Last 3 Encounters:  11/16/22 140 lb 12.8 oz (63.9 kg)  11/10/22 142 lb (64.4 kg)  10/05/22 140 lb (63.5 kg)    Physical Exam       Patient has been counseled extensively about nutrition and exercise as well as the importance of adherence with medications and regular follow-up. The patient was given clear instructions to go to ER or return to medical  center if symptoms don't improve, worsen or new problems develop. The patient verbalized understanding.   Follow-up: Return in about 3 months (around 02/16/2023).   Claiborne Rigg, FNP-BC [facility], [place] [phone]   11/16/2022, 12:31 PM

## 2022-11-18 ENCOUNTER — Other Ambulatory Visit: Payer: Self-pay

## 2022-11-23 ENCOUNTER — Other Ambulatory Visit: Payer: Self-pay

## 2022-12-06 ENCOUNTER — Other Ambulatory Visit: Payer: Self-pay

## 2022-12-07 ENCOUNTER — Other Ambulatory Visit: Payer: Self-pay

## 2022-12-15 ENCOUNTER — Telehealth: Payer: Self-pay | Admitting: Gastroenterology

## 2022-12-15 ENCOUNTER — Encounter: Payer: 59 | Admitting: Gastroenterology

## 2022-12-15 NOTE — Telephone Encounter (Signed)
Good afternoon Dr. Tomasa Rand  I called pt at 12:55 to see if they were coming to their procedure and nobody answered. Left message on voicemail to call us if she was running late or needed to reschedule.  I will no show this pt. Medicaid

## 2023-01-05 ENCOUNTER — Other Ambulatory Visit: Payer: Self-pay | Admitting: Nurse Practitioner

## 2023-01-05 ENCOUNTER — Other Ambulatory Visit: Payer: Self-pay

## 2023-01-05 ENCOUNTER — Other Ambulatory Visit: Payer: Self-pay | Admitting: Internal Medicine

## 2023-01-05 DIAGNOSIS — K219 Gastro-esophageal reflux disease without esophagitis: Secondary | ICD-10-CM

## 2023-01-06 ENCOUNTER — Other Ambulatory Visit: Payer: Self-pay

## 2023-01-09 ENCOUNTER — Other Ambulatory Visit: Payer: Self-pay

## 2023-01-10 ENCOUNTER — Other Ambulatory Visit: Payer: Self-pay

## 2023-01-10 ENCOUNTER — Other Ambulatory Visit (HOSPITAL_COMMUNITY): Payer: Self-pay

## 2023-01-10 ENCOUNTER — Telehealth: Payer: Self-pay | Admitting: Pharmacy Technician

## 2023-01-10 MED ORDER — FREESTYLE LIBRE 3 PLUS SENSOR MISC
3 refills | Status: DC
  Filled 2023-01-10 – 2023-03-16 (×2): qty 2, 30d supply, fill #0
  Filled 2023-04-13: qty 2, 30d supply, fill #1
  Filled 2023-04-27 (×2): qty 1, 15d supply, fill #2
  Filled 2023-04-27 – 2023-05-16 (×2): qty 2, 30d supply, fill #2
  Filled 2023-06-16: qty 2, 30d supply, fill #3
  Filled 2023-07-18: qty 2, 30d supply, fill #4
  Filled 2023-08-18: qty 2, 30d supply, fill #5
  Filled 2023-09-14: qty 2, 30d supply, fill #6
  Filled 2023-10-16: qty 2, 30d supply, fill #7
  Filled 2023-11-17: qty 2, 30d supply, fill #8
  Filled 2023-12-14: qty 2, 30d supply, fill #9

## 2023-01-10 MED ORDER — INSULIN LISPRO (1 UNIT DIAL) 100 UNIT/ML (KWIKPEN)
30.0000 [IU] | PEN_INJECTOR | Freq: Every day | SUBCUTANEOUS | 2 refills | Status: DC
  Filled 2023-01-10 – 2023-03-16 (×2): qty 9, 30d supply, fill #0
  Filled 2023-05-16: qty 9, 30d supply, fill #1
  Filled 2023-06-22: qty 27, 90d supply, fill #2

## 2023-01-10 NOTE — Telephone Encounter (Signed)
 Pharmacy Patient Advocate Encounter   Received notification from RX Request Messages that prior authorization for Humalog , Libre 3 plus/Dexcom G6 is required/requested.   Insurance verification completed.   The patient is insured through [employer]    .   Per test claim: The current 30 day co-pay is, $15 for CGM sensors, $0 for generic humalog , $35 foHumalog .  No PA needed at this time. This test claim was processed through [facility]- copay amounts may vary at other pharmacies due to pharmacy/plan contracts, or as the patient moves through the different stages of their insurance plan.

## 2023-01-17 ENCOUNTER — Other Ambulatory Visit: Payer: Self-pay

## 2023-01-19 ENCOUNTER — Other Ambulatory Visit: Payer: Self-pay

## 2023-01-19 ENCOUNTER — Encounter: Payer: Self-pay | Admitting: Nurse Practitioner

## 2023-01-27 ENCOUNTER — Other Ambulatory Visit: Payer: Self-pay

## 2023-01-27 ENCOUNTER — Emergency Department (HOSPITAL_COMMUNITY)
Admission: EM | Admit: 2023-01-27 | Discharge: 2023-01-28 | Disposition: A | Discharge status: Home / Self Care | Payer: No Typology Code available for payment source | Attending: Emergency Medicine | Admitting: Emergency Medicine

## 2023-01-27 ENCOUNTER — Ambulatory Visit: Payer: Self-pay | Admitting: Nurse Practitioner

## 2023-01-27 ENCOUNTER — Encounter (HOSPITAL_COMMUNITY): Payer: Self-pay | Admitting: Emergency Medicine

## 2023-01-27 DIAGNOSIS — Z794 Long term (current) use of insulin: Secondary | ICD-10-CM | POA: Diagnosis not present

## 2023-01-27 DIAGNOSIS — Z20822 Contact with and (suspected) exposure to covid-19: Secondary | ICD-10-CM | POA: Insufficient documentation

## 2023-01-27 DIAGNOSIS — E1165 Type 2 diabetes mellitus with hyperglycemia: Secondary | ICD-10-CM | POA: Insufficient documentation

## 2023-01-27 DIAGNOSIS — R7401 Elevation of levels of liver transaminase levels: Secondary | ICD-10-CM | POA: Diagnosis not present

## 2023-01-27 DIAGNOSIS — R112 Nausea with vomiting, unspecified: Secondary | ICD-10-CM | POA: Insufficient documentation

## 2023-01-27 DIAGNOSIS — R197 Diarrhea, unspecified: Secondary | ICD-10-CM | POA: Diagnosis not present

## 2023-01-27 DIAGNOSIS — Z7984 Long term (current) use of oral hypoglycemic drugs: Secondary | ICD-10-CM | POA: Insufficient documentation

## 2023-01-27 DIAGNOSIS — Z79899 Other long term (current) drug therapy: Secondary | ICD-10-CM | POA: Insufficient documentation

## 2023-01-27 LAB — CBC WITH DIFFERENTIAL/PLATELET
Abs Immature Granulocytes: 0.04 10*3/uL (ref 0.00–0.07)
Basophils Absolute: 0 10*3/uL (ref 0.0–0.1)
Basophils Relative: 0 %
Eosinophils Absolute: 0 10*3/uL (ref 0.0–0.5)
Eosinophils Relative: 0 %
HCT: 35.8 % — ABNORMAL LOW (ref 36.0–46.0)
Hemoglobin: 11.9 g/dL — ABNORMAL LOW (ref 12.0–15.0)
Immature Granulocytes: 0 %
Lymphocytes Relative: 18 %
Lymphs Abs: 2 10*3/uL (ref 0.7–4.0)
MCH: 29.9 pg (ref 26.0–34.0)
MCHC: 33.2 g/dL (ref 30.0–36.0)
MCV: 89.9 fL (ref 80.0–100.0)
Monocytes Absolute: 0.7 10*3/uL (ref 0.1–1.0)
Monocytes Relative: 6 %
Neutro Abs: 8.5 10*3/uL — ABNORMAL HIGH (ref 1.7–7.7)
Neutrophils Relative %: 76 %
Platelets: 387 10*3/uL (ref 150–400)
RBC: 3.98 MIL/uL (ref 3.87–5.11)
RDW: 13.3 % (ref 11.5–15.5)
WBC: 11.3 10*3/uL — ABNORMAL HIGH (ref 4.0–10.5)
nRBC: 0 % (ref 0.0–0.2)

## 2023-01-27 LAB — COMPREHENSIVE METABOLIC PANEL
ALT: 62 U/L — ABNORMAL HIGH (ref 0–44)
AST: 30 U/L (ref 15–41)
Albumin: 3.5 g/dL (ref 3.5–5.0)
Alkaline Phosphatase: 98 U/L (ref 38–126)
Anion gap: 13 (ref 5–15)
BUN: 10 mg/dL (ref 6–20)
CO2: 28 mmol/L (ref 22–32)
Calcium: 9.2 mg/dL (ref 8.9–10.3)
Chloride: 96 mmol/L — ABNORMAL LOW (ref 98–111)
Creatinine, Ser: 0.73 mg/dL (ref 0.44–1.00)
GFR, Estimated: >60 mL/min (ref >60)
Glucose, Bld: 446 mg/dL — ABNORMAL HIGH (ref 70–99)
Potassium: 3.5 mmol/L (ref 3.5–5.1)
Sodium: 137 mmol/L (ref 135–145)
Total Bilirubin: 0.7 mg/dL (ref 0.0–1.2)
Total Protein: 7.3 g/dL (ref 6.5–8.1)

## 2023-01-27 LAB — CBG MONITORING, ED: Glucose-Capillary: 452 mg/dL — ABNORMAL HIGH (ref 70–99)

## 2023-01-27 LAB — I-STAT VENOUS BLOOD GAS, ED
Acid-Base Excess: 8 mmol/L — ABNORMAL HIGH (ref 0.0–2.0)
Bicarbonate: 33.8 mmol/L — ABNORMAL HIGH (ref 20.0–28.0)
Calcium, Ion: 1.16 mmol/L (ref 1.15–1.40)
HCT: 38 % (ref 36.0–46.0)
Hemoglobin: 12.9 g/dL (ref 12.0–15.0)
O2 Saturation: 36 %
Potassium: 3.8 mmol/L (ref 3.5–5.1)
Sodium: 136 mmol/L (ref 135–145)
TCO2: 35 mmol/L — ABNORMAL HIGH (ref 22–32)
pCO2, Ven: 49.7 mm[Hg] (ref 44–60)
pH, Ven: 7.441 — ABNORMAL HIGH (ref 7.25–7.43)
pO2, Ven: 21 mm[Hg] — CL (ref 32–45)

## 2023-01-27 LAB — BETA-HYDROXYBUTYRIC ACID: Beta-Hydroxybutyric Acid: 0.23 mmol/L (ref 0.05–0.27)

## 2023-01-27 LAB — RESP PANEL BY RT-PCR (RSV, FLU A&B, COVID)  RVPGX2
Influenza A by PCR: NEGATIVE
Influenza B by PCR: NEGATIVE
Resp Syncytial Virus by PCR: NEGATIVE
SARS Coronavirus 2 by RT PCR: NEGATIVE

## 2023-01-27 NOTE — ED Provider Triage Note (Signed)
Emergency Medicine Provider Triage Evaluation Note  Alyzabeth Pontillo , a 53 y.o. female  was evaluated in triage.  Pt complains of nausea, vomiting, diarrhea that started today.  Also reports her blood sugars been higher than normal.  Reports she has been around some people been sick including similar test positive for COVID.  States she is supposed be on insulin, but is not following the recommended dose that was recently recommended by her cardiologist due to financial issues  Review of Systems  Positive: As above Negative: As above  Physical Exam  BP 117/76   Pulse 90   Temp 99.1 F (37.3 C)   Resp 16   SpO2 100%  Gen:   Awake, no distress   Resp:  Normal effort  MSK:   Moves extremities without difficulty    Medical Decision Making  Medically screening exam initiated at 6:32 PM.  Appropriate orders placed.  Carmencita Hildebrandt was informed that the remainder of the evaluation will be completed by another provider, this initial triage assessment does not replace that evaluation, and the importance of remaining in the ED until their evaluation is complete.     Arabella Merles, PA-C 01/27/23 [phone]

## 2023-01-27 NOTE — Telephone Encounter (Signed)
Chief Complaint: Vomiting Symptoms: Diarrhea, vomiting, headache, weakness, possible dehydration, abdominal cramps Frequency: Constant  Pertinent Negatives: Patient denies fever, chills, cough, chest pain  Disposition: [x] ED /[] Urgent Care (no appt availability in office) / [] Appointment(In office/virtual)/ []  [place]/ [] Home Care/ [] Refused Recommended Disposition /[] [facility]/ []  Follow-up with PCP Additional Notes: Patient states she woke up at 0400 and began vomiting, patient reports after vomiting 3-4 times she began to have diarrhea as well. Patient states she can not control the vomiting or diarrhea. She reports taking zofran x2 and each time she began to vomiting shortly after taking the medication. Patient is diabetic as well. Patient states her mouth is extremely dry and she feels dehydrated but she can't keep any fluids down. Care advice was given and patient advised to seek care at the nearest ED. Patient states she is about 15 minutes away from [facility] and her daughter will drive her.   Copied from CRM [id number]. Topic: Clinical - Red Word Triage >> Jan 27, 2023 12:57 PM Ivette P wrote: [facility] that prompted transfer to Nurse Triage: Diarrhea vomiting constantly from 4am-9:30am. Reason for Disposition  [1] SEVERE vomiting (e.g., 6 or more times/day) AND [2] present > 8 hours (Exception: Patient sounds well, is drinking liquids, does not sound dehydrated, and vomiting has lasted less than 24 hours.)  Answer Assessment - Initial Assessment Questions 1. VOMITING SEVERITY: "How many times have you vomited in the past 24 hours?"     - MILD:  1 - 2 times/day    - MODERATE: 3 - 5 times/day, decreased oral intake without significant weight loss or symptoms of dehydration    - SEVERE: 6 or more times/day, vomits everything or nearly everything, with significant weight loss, symptoms of dehydration      Severe  2. ONSET: "When did the vomiting begin?"       4am this morning  3. FLUIDS: "What fluids or food have you vomited up today?" "Have you been able to keep any fluids down?"     Everything is coming back up  4. ABDOMEN PAIN: "Are your having any abdomen pain?" If Yes : "How bad is it and what does it feel like?" (e.g., crampy, dull, intermittent, constant)      Yes, cramping pain  5. DIARRHEA: "Is there any diarrhea?" If Yes, ask: "How many times today?"      Yes, 4-5 times today 6. CONTACTS: "Is there anyone else in the family with the same symptoms?"      My grandbaby had Covid 2-3 weeks, and a female friend had been sick and he was over yesterday but he stated he wasn't sick anymore. 7. CAUSE: "What do you think is causing your vomiting?"     I'm not sure maybe I got the stomach bug  8. HYDRATION STATUS: "Any signs of dehydration?" (e.g., dry mouth [not only dry lips], too weak to stand) "When did you last urinate?"     Yes, dry mouth and feeling weak  9. OTHER SYMPTOMS: "Do you have any other symptoms?" (e.g., fever, headache, vertigo, vomiting blood or coffee grounds, recent head injury)     Headache, lightheaded with movement, blood in vomit  Protocols used: Vomiting-A-AH

## 2023-01-27 NOTE — ED Triage Notes (Signed)
Pt reports nausea, vomiting and diarrhea

## 2023-01-28 LAB — CBG MONITORING, ED
Glucose-Capillary: 391 mg/dL — ABNORMAL HIGH (ref 70–99)
Glucose-Capillary: 236 mg/dL — ABNORMAL HIGH (ref 70–99)

## 2023-01-28 MED ORDER — INSULIN ASPART 100 UNIT/ML IJ SOLN
8.0000 [IU] | Freq: Once | INTRAMUSCULAR | Status: AC
  Administered 2023-01-28: 8 [IU] via SUBCUTANEOUS

## 2023-01-28 MED ORDER — SODIUM CHLORIDE 0.9 % IV BOLUS
1000.0000 mL | Freq: Once | INTRAVENOUS | Status: AC
  Administered 2023-01-28: 1000 mL via INTRAVENOUS

## 2023-01-28 NOTE — Discharge Instructions (Addendum)
You likely have a virus causing your nausea vomiting diarrhea.  Please continue to hydrate at home, you may use your Zofran at home as needed for your nausea.  Follow-up with your primary care doctor and return to the ER with any new severe symptoms.  Please do prioritize collecting your new prescriptions for management of your blood sugars and follow-up closely with your endocrinologist.

## 2023-01-28 NOTE — ED Provider Notes (Signed)
[place] EMERGENCY DEPARTMENT AT [facility] Provider Note   CSN: [id number] Arrival date & time: 01/27/23  1802     History  Chief Complaint  Patient presents with   Emesis    Denise Hale is a 53 y.o. female with hx of DMT2 who presents with concern for N/V/D x 24 hours after exposure to ill friend with similar symptoms. States that she has had numerous NBNB emesis, watery, nonmelanotic diarrhea. Also with hyperglycemia; new insurance at the end of the year, has been rationing old insulin prescription while awaiting new insurance to kick-in. It is now active; she has been saving to afford her new medication co-pay. Recent transition in medications per endocrinology. Plans to pick up medications this weekend.   NO CP.   HPI     Home Medications Prior to Admission medications   Medication Sig Start Date End Date Taking? Authorizing Provider  atorvastatin (LIPITOR) 20 MG tablet Take 1 tablet (20 mg total) by mouth daily. FOR CHOLESTEROL 04/07/22   Claiborne Rigg, NP  Blood Glucose Monitoring Suppl (ONETOUCH VERIO REFLECT) w/Device KIT Check blood glucose level by fingerstick twice per day. Dx: E11.65 11/18/19   Hoy Register, MD  chlorhexidine (PERIDEX) 0.12 % solution Use as directed 15 mLs in the mouth or throat 2 (two) times daily. 11/16/22   Claiborne Rigg, NP  citalopram (CELEXA) 20 MG tablet Take 1 tablet (20 mg total) by mouth daily. For depression 07/13/22   Claiborne Rigg, NP  Continuous Glucose Sensor (FREESTYLE LIBRE 3 PLUS SENSOR) MISC Change sensor every 15 days. 01/10/23   Shamleffer, Konrad Dolores, MD  Continuous Glucose Sensor (FREESTYLE LIBRE 3 SENSOR) MISC use as directed change every every 14 (fourteen) days. Patient not taking: Reported on 11/16/2022 11/10/22   Shamleffer, Konrad Dolores, MD  Docusate Sodium (DSS) 100 MG CAPS Take by mouth. Patient not taking: Reported on 11/16/2022    [provider]  DULoxetine (CYMBALTA) 30 MG capsule  Take 1 capsule every day by oral route.    [provider]  empagliflozin (JARDIANCE) 10 MG TABS tablet Take 1 tablet (10 mg total) by mouth daily before breakfast. Patient not taking: Reported on 11/16/2022 11/10/22   Shamleffer, Konrad Dolores, MD  fluconazole (DIFLUCAN) 150 MG tablet Take 1 tablet every 72 hours for 3 doses then take 1 tablet weekly for the next 6 months. 04/07/22   Claiborne Rigg, NP  gabapentin (NEURONTIN) 600 MG tablet Take 1 tablet (600 mg total) by mouth 3 (three) times daily. 07/13/22   Claiborne Rigg, NP  insulin aspart (NOVOLOG FLEXPEN) 100 UNIT/ML FlexPen inject a max of 30 unitd daily as directed Patient not taking: Reported on 11/16/2022 11/10/22   Shamleffer, Konrad Dolores, MD  insulin glargine (LANTUS SOLOSTAR) 100 UNIT/ML Solostar Pen Inject 40 Units into the skin daily. Patient not taking: Reported on 11/16/2022 11/10/22   Shamleffer, Konrad Dolores, MD  insulin lispro (HUMALOG KWIKPEN) 100 UNIT/ML KwikPen inject a max of 30 units daily as directed 01/10/23   Shamleffer, Konrad Dolores, MD  Insulin Pen Needle 32G X 4 MM MISC Use in the morning, at noon, in the evening, and at bedtime. Patient not taking: Reported on 11/16/2022 11/10/22   Shamleffer, Konrad Dolores, MD  lansoprazole (PREVACID) 30 MG capsule Take 1 capsule (30 mg total) by mouth daily at 12 noon. FOR ACID REFLUX 10/27/22 04/25/23  Claiborne Rigg, NP  linaclotide [facility]) 72 MCG capsule Take 1 capsule (72 mcg total) by  mouth daily before breakfast. For constipation Patient not taking: Reported on 11/16/2022 04/07/22   Claiborne Rigg, NP  lisinopril (ZESTRIL) 2.5 MG tablet Take by mouth. Patient not taking: Reported on 11/16/2022    [provider]  lisinopril (ZESTRIL) 5 MG tablet Take 1 tablet (5 mg total) by mouth daily. 04/07/22   Claiborne Rigg, NP  Na Sulfate-K Sulfate-Mg Sulf 17.5-3.13-1.6 GM/177ML SOLN See admin instructions. 11/04/22   [provider]   nystatin-triamcinolone ointment (MYCOLOG) APPLY THIN FILM TO EXTERNAL VAGINAL AREA DAILY X 5 DAYS. DO NOT INSERT INTO VAGINA 04/07/22   Claiborne Rigg, NP  ondansetron (ZOFRAN-ODT) 4 MG disintegrating tablet Take 1 tablet (4 mg total) by mouth every 8 (eight) hours as needed for nausea or vomiting. 04/07/22   Claiborne Rigg, NP  pantoprazole (PROTONIX) 40 MG tablet Take 1 tablet every day by oral route.    [provider]  tiZANidine (ZANAFLEX) 4 MG tablet Take 1 tablet (4 mg total) by mouth every 6 (six) hours as needed for muscle spasms. 04/07/22   Claiborne Rigg, NP  TRUEplus Lancets 28G MISC Use as instructed. Check blood glucose level by fingerstick twice per day.   E11.65 04/07/22   Claiborne Rigg, NP      Allergies    Ciprofloxacin and Metformin and related    Review of Systems   Review of Systems  Constitutional:  Positive for appetite change. Negative for chills, diaphoresis, fatigue and fever.  HENT: Negative.    Respiratory: Negative.    Cardiovascular: Negative.   Gastrointestinal:  Positive for diarrhea, nausea and vomiting.  Genitourinary: Negative.   Neurological: Negative.     Physical Exam Updated Vital Signs BP (!) 141/76 (BP Location: Left Arm)   Pulse 73   Temp 98.2 F (36.8 C) (Oral)   Resp 17   SpO2 98%  Physical Exam Vitals and nursing note reviewed.  Constitutional:      Appearance: She is not ill-appearing or toxic-appearing.  HENT:     Head: Normocephalic and atraumatic.     Mouth/Throat:     Mouth: Mucous membranes are moist.     Pharynx: No oropharyngeal exudate or posterior oropharyngeal erythema.  Eyes:     General:        Right eye: No discharge.        Left eye: No discharge.     Conjunctiva/sclera: Conjunctivae normal.  Cardiovascular:     Rate and Rhythm: Normal rate and regular rhythm.     Pulses: Normal pulses.     Heart sounds: Normal heart sounds. No murmur heard. Pulmonary:     Effort: Pulmonary effort is normal. No  respiratory distress.     Breath sounds: Normal breath sounds. No wheezing or rales.  Abdominal:     General: Bowel sounds are normal. There is no distension.     Palpations: Abdomen is soft.     Tenderness: There is no abdominal tenderness. There is no right CVA tenderness, left CVA tenderness, guarding or rebound.  Musculoskeletal:        General: No deformity.     Cervical back: Neck supple.  Skin:    General: Skin is warm and dry.     Capillary Refill: Capillary refill takes less than 2 seconds.  Neurological:     General: No focal deficit present.     Mental Status: She is alert and oriented to person, place, and time. Mental status is at baseline.  Psychiatric:  Mood and Affect: Mood normal.     ED Results / Procedures / Treatments   Labs (all labs ordered are listed, but only abnormal results are displayed) Labs Reviewed  CBC WITH DIFFERENTIAL/PLATELET - Abnormal; Notable for the following components:      Result Value   WBC 11.3 (*)    Hemoglobin 11.9 (*)    HCT 35.8 (*)    Neutro Abs 8.5 (*)    All other components within normal limits  COMPREHENSIVE METABOLIC PANEL - Abnormal; Notable for the following components:   Chloride 96 (*)    Glucose, Bld 446 (*)    ALT 62 (*)    All other components within normal limits  CBG MONITORING, ED - Abnormal; Notable for the following components:   Glucose-Capillary 452 (*)    All other components within normal limits  I-STAT VENOUS BLOOD GAS, ED - Abnormal; Notable for the following components:   pH, Ven 7.441 (*)    pO2, Ven 21 (*)    Bicarbonate 33.8 (*)    TCO2 35 (*)    Acid-Base Excess 8.0 (*)    All other components within normal limits  CBG MONITORING, ED - Abnormal; Notable for the following components:   Glucose-Capillary 391 (*)    All other components within normal limits  CBG MONITORING, ED - Abnormal; Notable for the following components:   Glucose-Capillary 236 (*)    All other components within  normal limits  RESP PANEL BY RT-PCR (RSV, FLU A&B, COVID)  [id number]  BETA-HYDROXYBUTYRIC ACID    EKG None  Radiology No results found.  Procedures Procedures    Medications Ordered in ED Medications  sodium chloride 0.9 % bolus 1,000 mL (0 mLs Intravenous Stopped 01/28/23 0417)  insulin aspart (novoLOG) injection 8 Units (8 Units Subcutaneous Given 01/28/23 0239)    ED Course/ Medical Decision Making/ A&P                                 Medical Decision Making 53 y/o diabetic female who presents with N/V/D.   Normal VS on intake. Cardiopulmonary exam is unremarkable; abdominal exam is benign. GCS 15.   DDX includes but is not limited to viral gastroenteritis, metabolic derangement such as electrolyte abnormality or hyperglycemic crisis.   Amount and/or Complexity of Data Reviewed Labs:     Details: CBC with leukocytosis of 11,000, hemoglobin 11.9 near patient's baseline.  CMP with hyperglycemia of 446, mildly elevated ALT to 62 RVP negative, VBG without acidosis, beta hydroxybutyric acid is normal,   Risk Prescription drug management.   Patient tolerating p.o. at this time, nausea has resolved, no further diarrhea in the ED, IV fluids and subcu insulin administered, clinical picture most consistent with acute viral gastroenteritis do recommend close outpatient follow-up with endocrinology for further management of hyperglycemia, encourage patient to initiate new medication prescriptions and prioritized colectomies medications this weekend. Clinical concern for emergent underlying etiology of this patient's symptoms that would warrant further ED workup or inpatient management is exceedingly low.  Elaura  voiced understanding of her medical evaluation and treatment plan. Each of their questions answered to their expressed satisfaction.  Return precautions were given.  Patient is well-appearing, stable, and was discharged in good condition.  This chart was dictated using voice  recognition software, Dragon. Despite the best efforts of this provider to proofread and correct errors, errors may still occur which can change documentation meaning.  Final Clinical Impression(s) / ED Diagnoses Final diagnoses:  None    Rx / DC Orders ED Discharge Orders     None         Sherrilee Gilles 01/28/23 0421    Dione Booze, MD 01/28/23 [phone]

## 2023-02-10 ENCOUNTER — Other Ambulatory Visit: Payer: Self-pay | Admitting: Internal Medicine

## 2023-02-10 ENCOUNTER — Other Ambulatory Visit: Payer: Self-pay | Admitting: Nurse Practitioner

## 2023-02-10 ENCOUNTER — Other Ambulatory Visit: Payer: Self-pay

## 2023-02-10 DIAGNOSIS — K219 Gastro-esophageal reflux disease without esophagitis: Secondary | ICD-10-CM

## 2023-02-10 DIAGNOSIS — E1142 Type 2 diabetes mellitus with diabetic polyneuropathy: Secondary | ICD-10-CM

## 2023-02-10 MED ORDER — LANSOPRAZOLE 30 MG PO CPDR
30.0000 mg | DELAYED_RELEASE_CAPSULE | Freq: Every day | ORAL | 0 refills | Status: DC
  Filled 2023-02-10: qty 90, 90d supply, fill #0
  Filled 2023-05-16: qty 30, 30d supply, fill #0

## 2023-02-10 MED ORDER — GABAPENTIN 600 MG PO TABS
600.0000 mg | ORAL_TABLET | Freq: Three times a day (TID) | ORAL | 0 refills | Status: DC
  Filled 2023-02-10: qty 90, 30d supply, fill #0

## 2023-02-10 NOTE — Telephone Encounter (Signed)
 Requested by interface surescripts. Future visit in 1 week.  Requested Prescriptions  Pending Prescriptions Disp Refills   gabapentin  (NEURONTIN ) 600 MG tablet 90 tablet 0    Sig: Take 1 tablet (600 mg total) by mouth 3 (three) times daily.     Neurology: Anticonvulsants - gabapentin  Failed - 02/10/2023  2:25 PM      Failed - Completed PHQ-2 or PHQ-9 in the last 360 days      Passed - Cr in normal range and within 360 days    Creatinine, Ser  Date Value Ref Range Status  01/27/2023 0.73 0.44 - 1.00 mg/dL Final         Passed - Valid encounter within last 12 months    Recent Outpatient Visits           2 months ago Dental infection   [place] [facility] [place] - [facility] Dept Of [place]. [facility] [place], [place] W, NP   7 months ago Type 2 diabetes mellitus with diabetic peripheral angiopathy without gangrene, without long-term current use of insulin  [facility])   [place] [facility] - A Dept Of [place]. [facility] Theotis Haze ORN, NP   10 months ago Primary hypertension   [place] [facility] [place] - A Dept Of [place]. [facility] Theotis Haze ORN, NP   1 year ago Acute non-recurrent maxillary sinusitis   [place] [facility] [place] - A Dept Of [place]. [facility] Delbert Clam, MD   1 year ago Type 2 diabetes mellitus with diabetic peripheral angiopathy without gangrene, without long-term current use of insulin  [facility])   [place] [facility] - A Dept Of [place]. [facility] Theotis Haze ORN, NP       Future Appointments             In 1 week Theotis Haze ORN, NP [facility] [place] - A Dept Of [place]. [facility]             lansoprazole  (PREVACID ) 30 MG capsule 90 capsule 0    Sig: Take 1 capsule (30 mg total) by mouth daily at 12 noon. FOR ACID REFLUX     Gastroenterology: Proton Pump Inhibitors 2 Failed - 02/10/2023  2:25 PM      Failed - ALT in normal range and  within 360 days    ALT  Date Value Ref Range Status  01/27/2023 62 (H) 0 - 44 U/L Final         Passed - AST in normal range and within 360 days    AST  Date Value Ref Range Status  01/27/2023 30 15 - 41 U/L Final         Passed - Valid encounter within last 12 months    Recent Outpatient Visits           2 months ago Dental infection   [place] [facility] [place] - A Dept Of [place]. [facility] [place], [place] W, NP   7 months ago Type 2 diabetes mellitus with diabetic peripheral angiopathy without gangrene, without long-term current use of insulin  [facility])   [place] [facility] - A Dept Of [place]. [facility] Theotis Haze ORN, NP   10 months ago Primary hypertension    [facility] [place] - A Dept Of [place]. [facility] Theotis Haze ORN, NP   1 year ago  Acute non-recurrent maxillary sinusitis   [place] [facility] - A Dept Of [place]. [facility] Delbert Clam, MD   1 year ago Type 2 diabetes mellitus with diabetic peripheral angiopathy without gangrene, without long-term current use of insulin  [facility])   [place] [facility] - A Dept Of [place]. [facility] Theotis Haze ORN, NP       Future Appointments             In 1 week Theotis Haze ORN, NP [facility] - A Dept Of [place]. [facility]

## 2023-02-18 IMAGING — DX DG LUMBAR SPINE COMPLETE 4+V
5 series · 5 of 5 positions shown · non-contrast
Comparison: None.

CLINICAL DATA: Chronic low back pain

EXAM:
LUMBAR SPINE - COMPLETE 4+ VIEW

[l-spine ap]
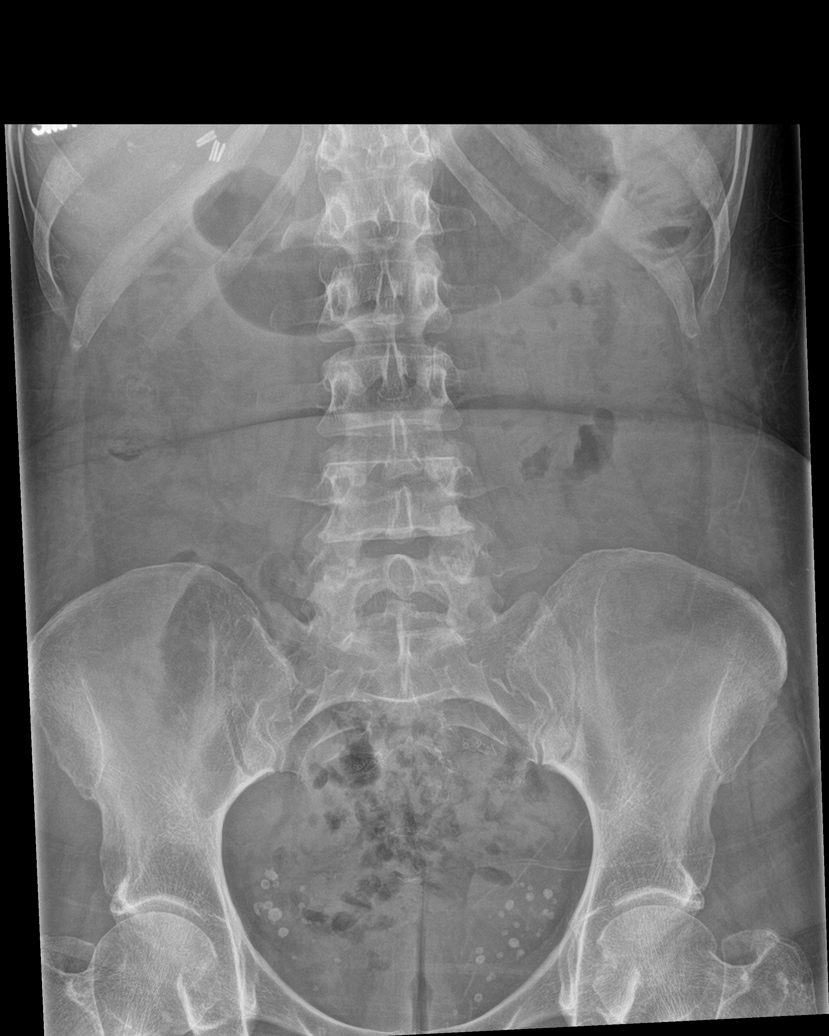

[l-spine obl (1 of 2)]
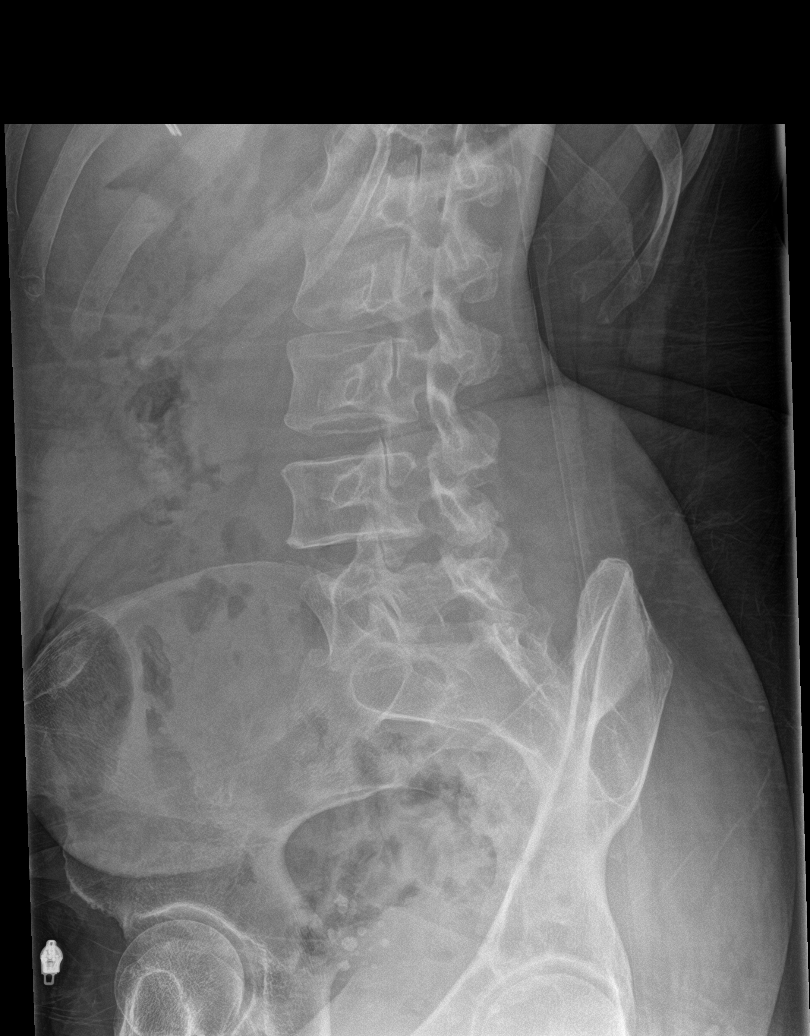

[l-spine obl (2 of 2)]
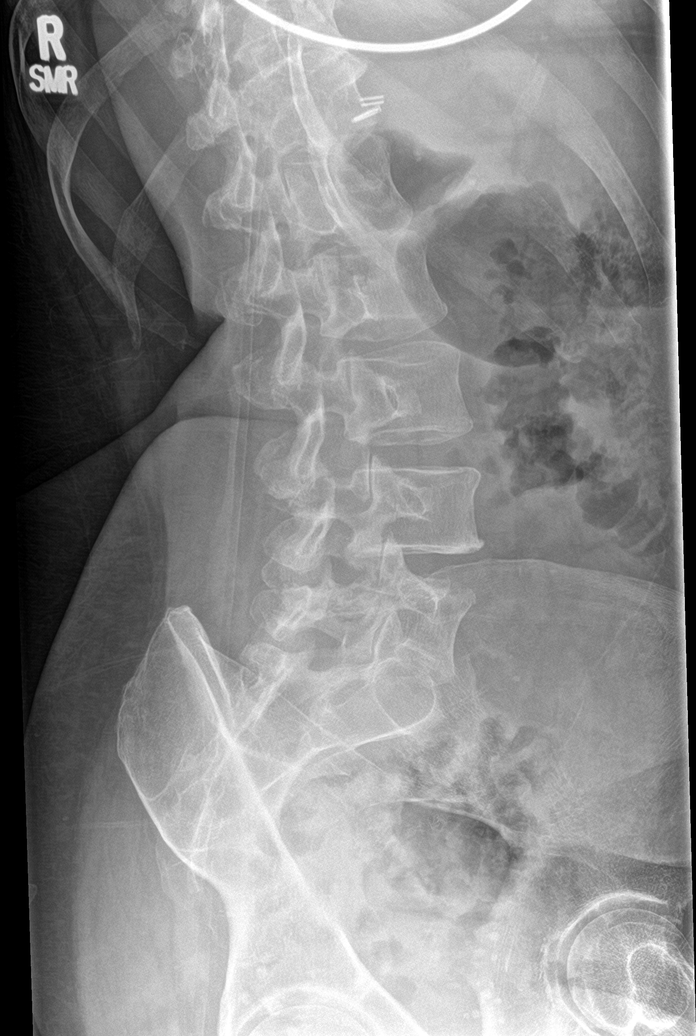

[l-spine lat]
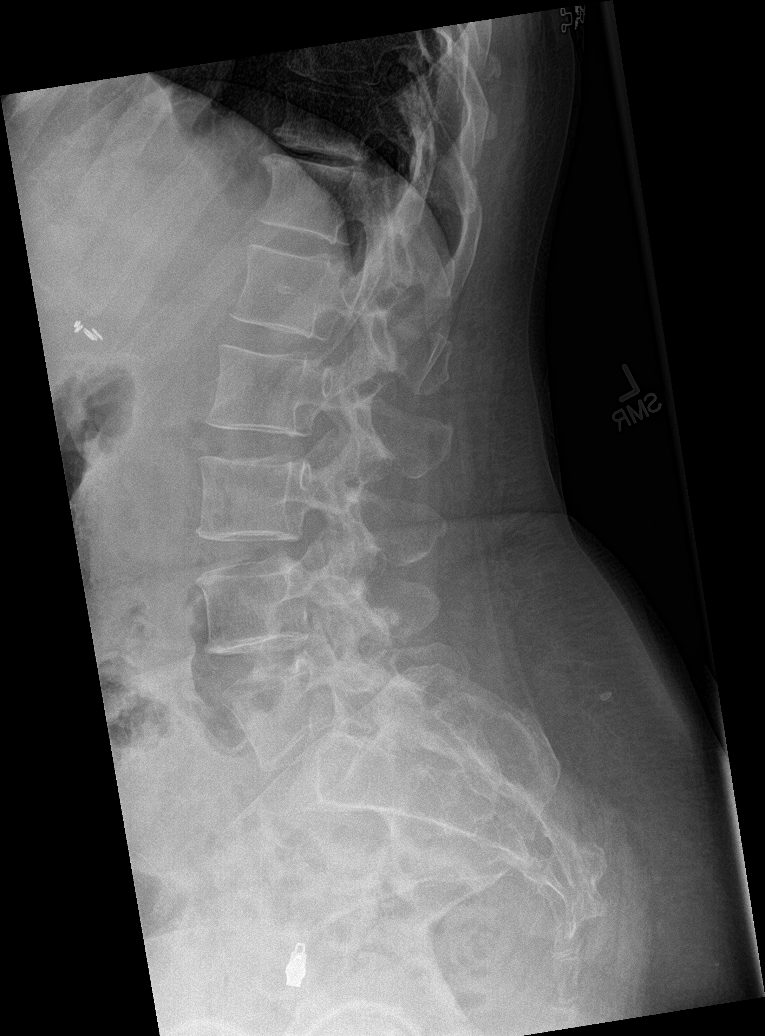

[l-spine spot]
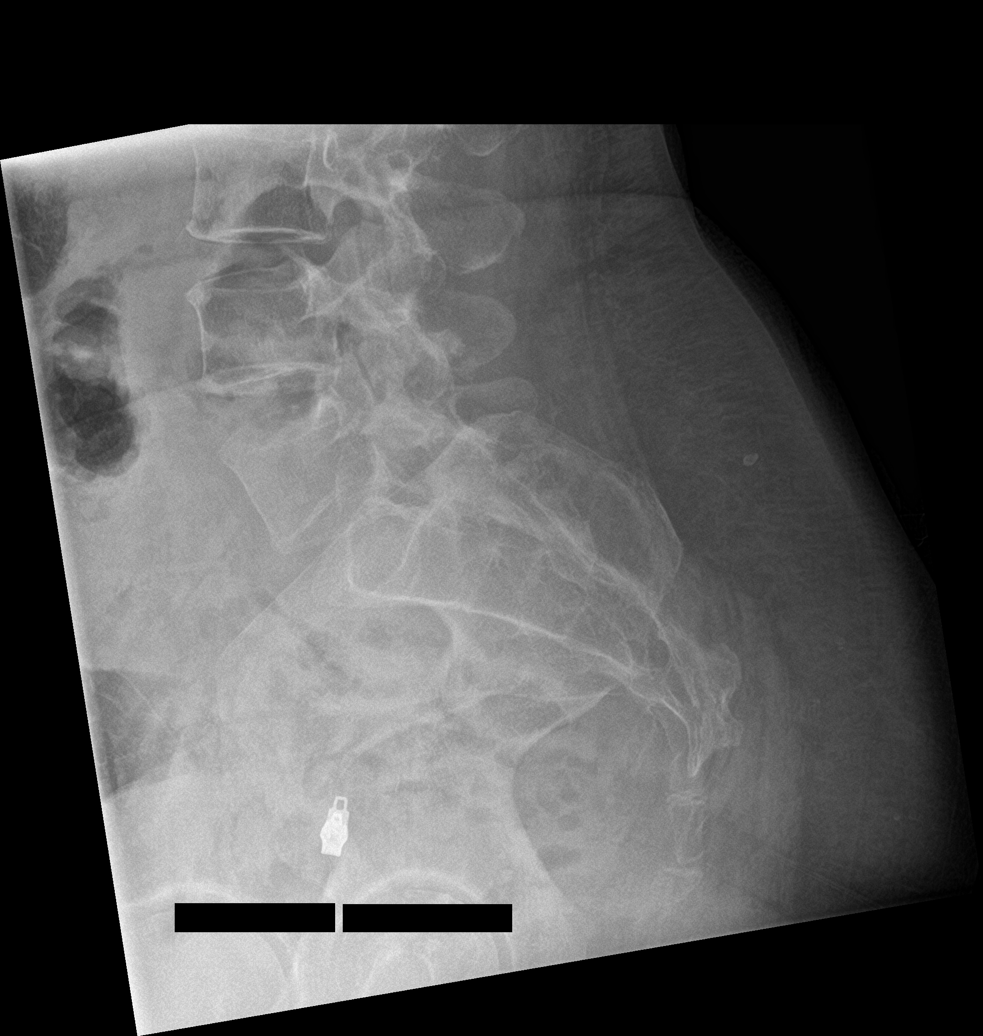

[5 of 5 positions shown; findings below may reference images not displayed]

FINDINGS: There is no evidence of lumbar spine fracture. Alignment is normal.
Intervertebral disc spaces are maintained. Multiple phleboliths in
the pelvis.
IMPRESSION: Negative.

## 2023-02-20 ENCOUNTER — Ambulatory Visit: Payer: 59 | Admitting: Nurse Practitioner

## 2023-02-21 ENCOUNTER — Ambulatory Visit: Payer: 59 | Admitting: Internal Medicine

## 2023-02-22 ENCOUNTER — Other Ambulatory Visit: Payer: Self-pay

## 2023-03-09 ENCOUNTER — Encounter: Payer: Self-pay | Admitting: Pharmacist

## 2023-03-09 ENCOUNTER — Other Ambulatory Visit (HOSPITAL_BASED_OUTPATIENT_CLINIC_OR_DEPARTMENT_OTHER): Payer: Self-pay | Admitting: Pharmacist

## 2023-03-09 DIAGNOSIS — E119 Type 2 diabetes mellitus without complications: Secondary | ICD-10-CM

## 2023-03-09 DIAGNOSIS — Z7984 Long term (current) use of oral hypoglycemic drugs: Secondary | ICD-10-CM

## 2023-03-09 DIAGNOSIS — Z794 Long term (current) use of insulin: Secondary | ICD-10-CM

## 2023-03-09 DIAGNOSIS — E1151 Type 2 diabetes mellitus with diabetic peripheral angiopathy without gangrene: Secondary | ICD-10-CM

## 2023-03-09 NOTE — Progress Notes (Signed)
 Pharmacy TNM Diabetes Measure Review  S:  Patient was identified in a report as being at risk for failing the True [place] Metric of A1c control (<8%) in [place] and African American patients. Last A1c was 11.7. Last PCP visit was 11/16/2022.  Call placed to patient to discuss diabetes control and medication management. Left HIPAA-compliant VM with instructions for follow-up.  Current diabetes medications include: empagliflozin 10 mg daily, Lantus 40u daily, Novolog 30u daily  Dispense history:  -Jardiance: never dispensed  -Lantus: last dispensed 06/29/2022 for a 25-day supply -Novolog or Humalog never dispensed  Insurance coverage: [employer]  O:   Lab Results  Component Value Date   HGBA1C 11.7 (A) 11/10/2022   There were no vitals filed for this visit.  Lipid Panel     Component Value Date/Time   CHOL 134 02/10/2021 1207   TRIG 54 02/10/2021 1207   HDL 65 02/10/2021 1207   CHOLHDL 2.1 02/10/2021 1207   LDLCALC 57 02/10/2021 1207    Clinical Atherosclerotic Cardiovascular Disease (ASCVD): No  The 10-year ASCVD risk score (Arnett DK, et al., 2019) is: 13.1%   Values used to calculate the score:     Age: 53 years     Sex: Female     Is Non-Hispanic African American: Yes     Diabetic: Yes     Tobacco smoker: Yes     Systolic Blood Pressure: 134 mmHg     Is BP treated: Yes     HDL Cholesterol: 65 mg/dL     Total Cholesterol: 134 mg/dL   Patient is participating in a Managed Medicaid Plan: No   A/P: Diabetes longstanding currently uncontrolled. On review of her dispense hx, she has not picked up ANY INSULIN since JULY, 2024! I was unable to connect with her, so I left a HIPAA-compliant VM with instructions for follow-up. Thankfully, she does have a PCP and Endocrinology appt scheduled later this month.  -Next A1c anticipated later this month.   Follow-up:  PCP clinic visit 03/22/23 Endocrinology visit 03/31/2023.   Butch Penny, PharmD, Patsy [place], CPP Clinical  Pharmacist [facility] [phone]

## 2023-03-10 ENCOUNTER — Telehealth: Payer: Self-pay | Admitting: *Deleted

## 2023-03-10 NOTE — Telephone Encounter (Signed)
 Copied from CRM [id number]. Topic: Clinical - Medication Question >> Mar 10, 2023 11:10 AM Alessandra Bevels wrote: Reason for CRM: Patient is calling to speak to [facility]

## 2023-03-16 ENCOUNTER — Other Ambulatory Visit: Payer: Self-pay

## 2023-03-16 ENCOUNTER — Other Ambulatory Visit: Payer: Self-pay | Admitting: Nurse Practitioner

## 2023-03-16 DIAGNOSIS — I10 Essential (primary) hypertension: Secondary | ICD-10-CM

## 2023-03-16 DIAGNOSIS — E1142 Type 2 diabetes mellitus with diabetic polyneuropathy: Secondary | ICD-10-CM

## 2023-03-16 NOTE — Telephone Encounter (Signed)
 Call returned. Patient has not picked up insulin since June, 2024. Spoke with her and found that this is due mainly because of issues with her insurance. She has been in-between different insurance plans. She now has [employer] and is able to get her insulin. She is still concerned with her copays. I informed her that she is able to utilize our charge account up to a certain amount as long as she makes payments.Will get these ready at our pharmacy and have patient follow-up with me in 1 month. She sees both her PCP and Endocrinologist later this month.   Butch Penny, PharmD, Patsy [place], CPP Clinical Pharmacist [facility] [phone]

## 2023-03-17 ENCOUNTER — Other Ambulatory Visit: Payer: Self-pay

## 2023-03-17 MED ORDER — LISINOPRIL 5 MG PO TABS
5.0000 mg | ORAL_TABLET | Freq: Every day | ORAL | 3 refills | Status: DC
  Filled 2023-03-17: qty 90, 90d supply, fill #0
  Filled 2023-06-16: qty 90, 90d supply, fill #1

## 2023-03-17 MED ORDER — GABAPENTIN 600 MG PO TABS
600.0000 mg | ORAL_TABLET | Freq: Three times a day (TID) | ORAL | 0 refills | Status: DC
  Filled 2023-03-17: qty 90, 30d supply, fill #0

## 2023-03-17 NOTE — Telephone Encounter (Signed)
 Requested Prescriptions  Pending Prescriptions Disp Refills   gabapentin (NEURONTIN) 600 MG tablet 90 tablet 0    Sig: Take 1 tablet (600 mg total) by mouth 3 (three) times daily.     Neurology: Anticonvulsants - gabapentin Failed - 03/17/2023 11:36 AM      Failed - Completed PHQ-2 or PHQ-9 in the last 360 days      Passed - Cr in normal range and within 360 days    Creatinine, Ser  Date Value Ref Range Status  01/27/2023 0.73 0.44 - 1.00 mg/dL Final         Passed - Valid encounter within last 12 months    Recent Outpatient Visits           4 months ago Dental infection   [facility] [place] [facility] Dept Of [place]. [facility] [place], [place] W, NP   8 months ago Type 2 diabetes mellitus with diabetic peripheral angiopathy without gangrene, without long-term current use of insulin (HCC)   [place] [facility] [place] - A Dept Of [place]. [facility] Claiborne Rigg, NP   11 months ago Primary hypertension   [place] [facility] [place] - A Dept Of [place]. [facility] Claiborne Rigg, NP   1 year ago Acute non-recurrent maxillary sinusitis   [place] [facility] [place] - A Dept Of [place]. [facility] Hoy Register, MD   1 year ago Type 2 diabetes mellitus with diabetic peripheral angiopathy without gangrene, without long-term current use of insulin (HCC)   [place] [facility] [place] - A Dept Of [place]. [facility] Claiborne Rigg, NP       Future Appointments             In 5 days Claiborne Rigg, NP [facility] [place] - A Dept Of Eligha Bridegroom. [facility]   In 1 month Lois Huxley, Cornelius Moras, RPH-CPP [place] [facility] [place] - A Dept Of [place]. [facility]             lisinopril (ZESTRIL) 5 MG tablet 90 tablet 3    Sig: Take 1 tablet (5 mg total) by mouth daily.     Cardiovascular:  ACE Inhibitors Passed - 03/17/2023 11:36 AM      Passed - Cr in  normal range and within 180 days    Creatinine, Ser  Date Value Ref Range Status  01/27/2023 0.73 0.44 - 1.00 mg/dL Final         Passed - K in normal range and within 180 days    Potassium  Date Value Ref Range Status  01/27/2023 3.8 3.5 - 5.1 mmol/L Final         Passed - Patient is not pregnant      Passed - Last BP in normal range    BP Readings from Last 1 Encounters:  01/28/23 134/75         Passed - Valid encounter within last 6 months    Recent Outpatient Visits           4 months ago Dental infection   [place] [facility] [place] - [facility] Dept Of [place]. [facility] [place], [place] W, NP   8 months ago Type 2 diabetes mellitus with diabetic peripheral angiopathy without gangrene, without long-term current use of insulin [facility])   [place] [facility] - A  Dept Of [place]. [facility] Claiborne Rigg, NP   11 months ago Primary hypertension   [place] [facility] [place] - A Dept Of [place]. [facility] Claiborne Rigg, NP   1 year ago Acute non-recurrent maxillary sinusitis   [place] [facility] [place] - A Dept Of [place]. [facility] Hoy Register, MD   1 year ago Type 2 diabetes mellitus with diabetic peripheral angiopathy without gangrene, without long-term current use of insulin (HCC)   [place] [facility] [place] - A Dept Of [place]. [facility] Claiborne Rigg, NP       Future Appointments             In 5 days Claiborne Rigg, NP [facility] - A Dept Of [place] Bridegroom. [facility]   In 1 month Lois Huxley, Cornelius Moras, RPH-CPP [place] [facility] [place] - A Dept Of [place]. [facility]

## 2023-03-22 ENCOUNTER — Ambulatory Visit: Payer: 59 | Admitting: Nurse Practitioner

## 2023-03-23 ENCOUNTER — Other Ambulatory Visit: Payer: Self-pay

## 2023-03-30 ENCOUNTER — Other Ambulatory Visit: Payer: Self-pay | Admitting: Pharmacist

## 2023-03-30 ENCOUNTER — Other Ambulatory Visit: Payer: Self-pay

## 2023-03-30 MED ORDER — FREESTYLE LIBRE 3 READER DEVI
0 refills | Status: AC
  Filled 2023-03-30: qty 1, 30d supply, fill #0

## 2023-03-31 ENCOUNTER — Other Ambulatory Visit: Payer: Self-pay

## 2023-03-31 ENCOUNTER — Encounter: Payer: Self-pay | Admitting: Internal Medicine

## 2023-03-31 ENCOUNTER — Ambulatory Visit (INDEPENDENT_AMBULATORY_CARE_PROVIDER_SITE_OTHER): Payer: 59 | Admitting: Internal Medicine

## 2023-03-31 VITALS — BP 120/76 | HR 75 | Ht 66.0 in | Wt 131.0 lb

## 2023-03-31 DIAGNOSIS — E1165 Type 2 diabetes mellitus with hyperglycemia: Secondary | ICD-10-CM | POA: Diagnosis not present

## 2023-03-31 DIAGNOSIS — Z794 Long term (current) use of insulin: Secondary | ICD-10-CM | POA: Diagnosis not present

## 2023-03-31 LAB — POCT GLYCOSYLATED HEMOGLOBIN (HGB A1C): Hemoglobin A1C: 12.7 % — AB (ref 4.0–5.6)

## 2023-03-31 MED ORDER — EMPAGLIFLOZIN 10 MG PO TABS
10.0000 mg | ORAL_TABLET | Freq: Every day | ORAL | 3 refills | Status: DC
  Filled 2023-03-31 – 2023-06-16 (×2): qty 90, 90d supply, fill #0

## 2023-03-31 NOTE — Patient Instructions (Addendum)
  Increase Jardiance 25 mg, 1 tablet every morning  Continue Lantus 40 units ONCE daily  Continue Novolog 6 units with each meal  Novolog correctional insulin: ADD extra units on insulin to your meal-time Novolog dose if your blood sugars are higher than 160. Use the scale below to help guide you:   Blood sugar before meal Number of units to inject  Less than 160 0 unit  161 -  190 1 units  191 -  220 2 units  221 -  250 3 units  251 -  280 4 units  281 -  310 5 units  311 -  340 6 units  341 -  370 7 units  371 -  400 8 units    HOW TO TREAT LOW BLOOD SUGARS (Blood sugar LESS THAN 70 MG/DL) Please follow the RULE OF 15 for the treatment of hypoglycemia treatment (when your (blood sugars are less than 70 mg/dL)   STEP 1: Take 15 grams of carbohydrates when your blood sugar is low, which includes:  3-4 GLUCOSE TABS  OR 3-4 OZ OF JUICE OR REGULAR SODA OR ONE TUBE OF GLUCOSE GEL    STEP 2: RECHECK blood sugar in 15 MINUTES STEP 3: If your blood sugar is still low at the 15 minute recheck --> then, go back to STEP 1 and treat AGAIN with another 15 grams of carbohydrates.

## 2023-03-31 NOTE — Progress Notes (Signed)
 Name: Denise Hale  MRN/ DOB: [id number], Jul 18, 1970   Age/ Sex: 53 y.o., female    PCP: Claiborne Rigg, NP   Reason for Endocrinology Evaluation: Type 2 Diabetes Mellitus     Date of Initial Endocrinology Visit: 11/10/2022    PATIENT IDENTIFIER: Denise Hale is a 53 y.o. female with a past medical history of DM, . The patient presented for initial endocrinology clinic visit on 11/10/2022 for consultative assistance with her diabetes management.    HPI: Denise Hale was    Diagnosed with DM 16 yrs ago  Prior Medications tried/Intolerance: Metformin-diarrhea, tradjenta - no intolerance. Trulicity - extreme weight loss and GI issues                   Hemoglobin A1c has ranged from 6.6% in 2023, peaking at 12.3% in 2024.   On her initial visit to our clinic her A1c was 11.7%, she was on glipizide XL 10 mg and Humalog mix.  I stopped glipizide and started her on Jardiance.  I also switched insulin mix to basal/prandial.  A prescription for freestyle libre 3 was sent to the pharmacy at the time.      SUBJECTIVE:   During the last visit (11/10/2022): A1c 11.7%  Today (03/31/23): Denise Hale is here for follow-up on diabetes management.  She had a prescription freestyle libre, but she was having sensory issues and using fingersticks in between .she checks blood sugars 1-2 times daily. The patient has had hypoglycemic episodes since the last clinic visit.   She did present to the ED in January, 2025 with vomiting, serum glucose was 446 Mg/DL, normal anion gap and bicarb   She has not been on her meds due to cost issues . Started getting insulin last month through the [facility] pharmacy, and jardiance 2 weeks    No polydipsia nor frequency recently  Has mild constipating  Denies nausea or vomiting   HOME DIABETES REGIMEN: Jardiance 10 mg daily Lantus 40 units daily NovoLog 6 units 3 times daily before every meal CF: NovoLog(BG -130/30) 3 times daily before every meal   Statin:  yes ACE-I/ARB: Yes   METER DOWNLOAD SUMMARY:  44- 408 mg/dL   CONTINUOUS GLUCOSE MONITORING RECORD INTERPRETATION    Dates of Recording: 3/15 - 03/31/2023  Sensor description: [employer]  Results statistics:   CGM use % of time 5%  Average and SD 290/19.1  Time in range      0  %  % Time Above 180 31  % Time above 250 69  % Time Below target 0   Glycemic patterns summary: BGs have been elevated throughout the day and night  Hyperglycemic episodes postprandial  Hypoglycemic episodes occurred N/A on freestyle libre download  Overnight periods: High    DIABETIC COMPLICATIONS: Microvascular complications:  Neuropathy, DR  Denies: CKD Last eye exam: Completed 11/2020  Macrovascular complications:   Denies: CAD, PVD, CVA   PAST HISTORY: Past Medical History:  Past Medical History:  Diagnosis Date   Anxiety    Asthma    Diverticulitis    DM (diabetes mellitus), type 2 with ophthalmic complications (HCC)    GERD (gastroesophageal reflux disease)    Severe nonproliferative diabetic retinopathy (HCC)    Past Surgical History:  Past Surgical History:  Procedure Laterality Date   CARPAL TUNNEL RELEASE Right 04/08/2020   Procedure: RIGHT CARPAL TUNNEL RELEASE;  Surgeon: Tarry Kos, MD;  Location: [facility];  Service: Orthopedics;  Laterality: Right;  CHOLECYSTECTOMY     COLON SURGERY      Social History:  reports that she has been smoking cigars. She has never used smokeless tobacco. She reports that she does not currently use alcohol. She reports current drug use. Drug: Marijuana. Family History:  Family History  Problem Relation Age of Onset   Diabetes Mother    Thyroid disease Mother    Heart disease Father    Breast cancer Paternal Aunt    Breast cancer Paternal Aunt      HOME MEDICATIONS: Allergies as of 03/31/2023       Reactions   Ciprofloxacin    Metformin And Related Diarrhea        Medication List         Accurate as of March 31, 2023  9:18 AM. If you have any questions, ask your nurse or doctor.          atorvastatin 20 MG tablet Commonly known as: LIPITOR Take 1 tablet (20 mg total) by mouth daily. FOR CHOLESTEROL   BD Pen Needle Nano U/F 32G X 4 MM Misc Generic drug: Insulin Pen Needle Use in the morning, at noon, in the evening, and at bedtime.   chlorhexidine 0.12 % solution Commonly known as: Peridex Use as directed 15 mLs in the mouth or throat 2 (two) times daily.   citalopram 20 MG tablet Commonly known as: CELEXA Take 1 tablet (20 mg total) by mouth daily. For depression   Cymbalta 30 MG capsule Generic drug: DULoxetine Take 1 capsule every day by oral route.   DSS 100 MG Caps Take by mouth.   fluconazole 150 MG tablet Commonly known as: DIFLUCAN Take 1 tablet every 72 hours for 3 doses then take 1 tablet weekly for the next 6 months.   FreeStyle Libre 3 Plus Sensor Misc Change sensor every 15 days.   FreeStyle Libre 3 Reader [place] Use to check blood sugar continuously throughout the day.   gabapentin 600 MG tablet Commonly known as: Neurontin Take 1 tablet (600 mg total) by mouth 3 (three) times daily.   insulin lispro 100 UNIT/ML KwikPen Commonly known as: HumaLOG KwikPen inject a max of 30 units daily as directed   Jardiance 10 MG Tabs tablet Generic drug: empagliflozin Take 1 tablet (10 mg total) by mouth daily before breakfast.   lansoprazole 30 MG capsule Commonly known as: PREVACID Take 1 capsule (30 mg total) by mouth daily at 12 noon. FOR ACID REFLUX   Lantus SoloStar 100 UNIT/ML Solostar Pen Generic drug: insulin glargine Inject 40 Units into the skin daily.   linaclotide 72 MCG capsule Commonly known as: Linzess Take 1 capsule (72 mcg total) by mouth daily before breakfast. For constipation   lisinopril 2.5 MG tablet Commonly known as: ZESTRIL Take by mouth.   lisinopril 5 MG tablet Commonly known as: ZESTRIL Take 1 tablet (5 mg  total) by mouth daily.   Na Sulfate-K Sulfate-Mg Sulfate concentrate 17.5-3.13-1.6 GM/177ML Soln Commonly known as: SUPREP See admin instructions.   NovoLOG FlexPen 100 UNIT/ML FlexPen Generic drug: insulin aspart inject a max of 30 unitd daily as directed   nystatin-triamcinolone ointment Commonly known as: MYCOLOG APPLY THIN FILM TO EXTERNAL VAGINAL AREA DAILY X 5 DAYS. DO NOT INSERT INTO VAGINA   ondansetron 4 MG disintegrating tablet Commonly known as: ZOFRAN-ODT Take 1 tablet (4 mg total) by mouth every 8 (eight) hours as needed for nausea or vomiting.   OneTouch Verio Reflect w/Device Kit Check blood glucose level  by fingerstick twice per day. Dx: E11.65   pantoprazole 40 MG tablet Commonly known as: PROTONIX Take 1 tablet every day by oral route.   tiZANidine 4 MG tablet Commonly known as: ZANAFLEX Take 1 tablet (4 mg total) by mouth every 6 (six) hours as needed for muscle spasms.   TRUEplus Lancets 28G Misc Use as instructed. Check blood glucose level by fingerstick twice per day.   E11.65         ALLERGIES: Allergies  Allergen Reactions   Ciprofloxacin    Metformin And Related Diarrhea     REVIEW OF SYSTEMS: A comprehensive ROS was conducted with the patient and is negative except as per HPI    OBJECTIVE:   VITAL SIGNS: BP 120/76 (BP Location: Left Arm, Patient Position: Sitting, Cuff Size: Normal)   Pulse 75   Ht 5\' 6"  (1.676 m)   Wt 131 lb (59.4 kg)   SpO2 98%   BMI 21.14 kg/m    PHYSICAL EXAM:  General: Pt appears well and is in NAD  Neck: General: Supple without adenopathy or carotid bruits. Thyroid: Thyroid size normal.  No goiter or nodules appreciated.   Lungs: Clear with good BS bilat   Heart: RRR   Abdomen:  soft, nontender  Extremities:  Lower extremities - No pretibial edema.   Skin:  No rash noted.  Neuro: MS is good with appropriate affect, pt is alert and Ox3    DM foot exam: 11/10/2022  The skin of the feet is intact  without sores or ulcerations. The pedal pulses are 2+ on right and 2+ on left. The sensation is intact to a screening 5.07, 10 gram monofilament bilaterally    DATA REVIEWED:  Lab Results  Component Value Date   HGBA1C 12.7 (A) 03/31/2023   HGBA1C 11.7 (A) 11/10/2022   HGBA1C 11.9 (A) 07/13/2022     ASSESSMENT / PLAN / RECOMMENDATIONS:   1) Type 2 Diabetes Mellitus, Poorly controlled, With Neuropathic and retinopathic  complications - Most recent A1c of 12.7 %. Goal A1c < 7.0 %.     -Patient continues with hyperglycemia, unfortunately she has not had her glycemic agents in months due to cost, she was recently able to get them from the [facility] at a good price -She has not been consistent with prandial dose of insulin, I did encourage the patient to take prandial insulin with each meal -No changes to Lantus dose at this time -I will increase Jardiance as below - Intolerant to metformin - Intolerant to Trulicity as well as did not like extreme weight loss   MEDICATIONS:  Increase Jardiance 25 mg daily Continue  Lantus 40 units daily Continue NovoLog 6 units 3 times daily before every meal Continue CF: Novolog (BG-130/30) TIDQAC  EDUCATION / INSTRUCTIONS: BG monitoring instructions: Patient is instructed to check her blood sugars 3 times a day. Call [place] [facility] if: BG persistently < 70  I reviewed the Rule of 15 for the treatment of hypoglycemia in detail with the patient. Literature supplied.   2) Diabetic complications:  Eye: Does  have known diabetic retinopathy.  Neuro/ Feet: Does  have known diabetic peripheral neuropathy. Renal: Patient does not have known baseline CKD. She is  on an ACEI/ARB at present.     Follow-up in 3 months  Signed electronically by: Lyndle Herrlich, MD  [facility] Endocrinology  [facility] [address] Laurell Josephs [address] [place], [place] [address] Phone: [phone] FAX: [phone]  CC: Claiborne Rigg, NP [address] [place] [address] [place] [address] Phone: [phone]  Fax: [phone]    Return to Endocrinology clinic as below: Future Appointments  Date Time Provider Department Center  04/10/2023  9:30 AM Claiborne Rigg, NP [facility] None  04/25/2023  9:00 AM Drucilla Chalet, RPH-CPP [facility] None

## 2023-04-10 ENCOUNTER — Ambulatory Visit: Admitting: Nurse Practitioner

## 2023-04-13 ENCOUNTER — Other Ambulatory Visit: Payer: Self-pay

## 2023-04-14 ENCOUNTER — Other Ambulatory Visit: Payer: Self-pay

## 2023-04-25 ENCOUNTER — Ambulatory Visit: Admitting: Pharmacist

## 2023-04-27 ENCOUNTER — Other Ambulatory Visit: Payer: Self-pay | Admitting: Nurse Practitioner

## 2023-04-27 ENCOUNTER — Other Ambulatory Visit (HOSPITAL_COMMUNITY): Payer: Self-pay

## 2023-04-27 DIAGNOSIS — E785 Hyperlipidemia, unspecified: Secondary | ICD-10-CM

## 2023-04-27 DIAGNOSIS — F32A Depression, unspecified: Secondary | ICD-10-CM

## 2023-04-27 DIAGNOSIS — E1142 Type 2 diabetes mellitus with diabetic polyneuropathy: Secondary | ICD-10-CM

## 2023-04-27 MED ORDER — CITALOPRAM HYDROBROMIDE 20 MG PO TABS
20.0000 mg | ORAL_TABLET | Freq: Every day | ORAL | 0 refills | Status: DC
  Filled 2023-04-27: qty 90, 90d supply, fill #0

## 2023-04-27 MED ORDER — ATORVASTATIN CALCIUM 20 MG PO TABS
20.0000 mg | ORAL_TABLET | Freq: Every day | ORAL | 0 refills | Status: DC
  Filled 2023-04-27: qty 90, 90d supply, fill #0

## 2023-04-27 MED ORDER — GABAPENTIN 600 MG PO TABS
600.0000 mg | ORAL_TABLET | Freq: Three times a day (TID) | ORAL | 0 refills | Status: DC
  Filled 2023-04-27: qty 90, 30d supply, fill #0

## 2023-04-28 ENCOUNTER — Other Ambulatory Visit (HOSPITAL_COMMUNITY): Payer: Self-pay

## 2023-04-28 ENCOUNTER — Other Ambulatory Visit: Payer: Self-pay

## 2023-04-28 DIAGNOSIS — R748 Abnormal levels of other serum enzymes: Secondary | ICD-10-CM

## 2023-05-16 ENCOUNTER — Other Ambulatory Visit: Payer: Self-pay

## 2023-05-25 NOTE — Progress Notes (Deleted)
 Pharmacy TNM Diabetes Measure Review  S:  Patient was identified in a report as being at risk for failing the True [place] Metric of A1c control (<8%) in [place] and African American patients. Pharmacist called 03/11/2023 and left voicemail to follow up.  Last A1c on 03/31/2023 was 12.7%. Patient was seen with endocrinology 03/31/2023 where patient was taken off of glipizide  XL 10 mg and Humalog  mix due to events of hypoglycemia. Patient was started on Jardiance  10 mg daily and transitioned to Lantus  40 U daily and humalog  6 U TID before meals. Patient was dispensed a Libre3 sensor as patient has sensory issues with using fingerstick to check blood sugars. Patient reported nonadherence to medications in the past due to financial barriers. Patient has known diabetic retinopathy and peripheral neuropathy.     Today,   ***adherence to all meds  Current anti-diabetic medications: Lantus  40 U daily, Humalog  6 U TID before meals, and Jardiance  10 mg daily   ***checking blood sugars at home:  ***any readings above 200:   ***hypoglycemic events   ***nocturia ***peripheral neuropathy ***vision changes  Patient reports self foot exams.   Insurance coverage: [employer]  Patient reported dietary habits:   Patient reported exercise habits:   Date of Download: *** % Time CGM is active: ***% Average Glucose: *** mg/dL Glucose Management Indicator: ***  Glucose Variability: *** (goal <36%) Time in Goal:  - Time in range 70-180: ***% - Time above range: ***% - Time below range: ***% Observed patterns:  O:   Lab Results  Component Value Date   HGBA1C 12.7 (A) 03/31/2023   There were no vitals filed for this visit.  Lipid Panel     Component Value Date/Time   CHOL 134 02/10/2021 1207   TRIG 54 02/10/2021 1207   HDL 65 02/10/2021 1207   CHOLHDL 2.1 02/10/2021 1207   LDLCALC 57 02/10/2021 1207    Clinical Atherosclerotic Cardiovascular Disease (ASCVD): No  The 10-year ASCVD risk score  (Arnett DK, et al., 2019) is: 8.9%   Values used to calculate the score:     Age: 53 years     Sex: Female     Is Non-Hispanic African American: Yes     Diabetic: Yes     Tobacco smoker: Yes     Systolic Blood Pressure: 120 mmHg     Is BP treated: Yes     HDL Cholesterol: 65 mg/dL     Total Cholesterol: 134 mg/dL   Patient is participating in a Managed Medicaid Plan: No   A/P: Diabetes longstanding currently uncontrolled due to A1c above goal of < 7%. Patient was previously nonadherent to her medications due to financial barriers but patient has been doing very well and been following up with endocrinology for insulin  management. Patient is ***symptomatic/asymptomatic and reported ***hypoglycemic events. Patient is motivated to improve her glycemic control. For additional glycemic control, need to add another medication as patient's reported home blood sugars and recent A1c are above goal.  - Continue Lantus  40 U daily - Continue Humalog  6 U TID before meals  - Continue Jardiance  10 mg daily  - start ***pioglitazone???*** - next A1c 06/2023   Follow-up:  Pharmacist: *** PCP visit: *** Endocrinology visit ***  Georges Kings M.S. PharmD Candidate Class of 2026 [employer]   ***

## 2023-05-26 ENCOUNTER — Ambulatory Visit: Admitting: Pharmacist

## 2023-06-01 ENCOUNTER — Other Ambulatory Visit: Payer: Self-pay | Admitting: Nurse Practitioner

## 2023-06-01 ENCOUNTER — Ambulatory Visit: Payer: Self-pay

## 2023-06-01 DIAGNOSIS — Z794 Long term (current) use of insulin: Secondary | ICD-10-CM

## 2023-06-01 DIAGNOSIS — N761 Subacute and chronic vaginitis: Secondary | ICD-10-CM

## 2023-06-01 NOTE — Telephone Encounter (Signed)
 Copied from CRM [id number]. Topic: Clinical - Medication Refill >> Jun 01, 2023 12:29 PM Yolanda T wrote: Medication: fluconazole  (DIFLUCAN ) 150 MG tablet and gabapentin  (NEURONTIN ) 600 MG tablet  Has the patient contacted their pharmacy? No  This is the patient's preferred pharmacy:  [facility] [place] [address]BJYNW: [phone] Fax: [phone]: M-F 7:30a-6:00p    Is this the correct pharmacy for this prescription? Yes  Has the prescription been filled recently? Yes  Is the patient out of the medication? Yes  Has the patient been seen for an appointment in the last year OR does the patient have an upcoming appointment? Yes  Can we respond through MyChart? Yes  Agent: Please be advised that Rx refills may take up to 3 business days. We ask that you follow-up with your pharmacy.

## 2023-06-01 NOTE — Telephone Encounter (Signed)
 Pt called, someone answered the phone but never spoke to this Clinical research associate. I said hello and called pt's name several times before hanging up.   Copied from CRM [id number]. Topic: Clinical - Red Word Triage >> Jun 01, 2023 12:00 PM Carla L wrote: Reason for Triage:  Pt currently having burning sensation in vaginal area and swelling in vulval area. Pt believes it is a yeast infection. Pt requesting refill of fluconazole  as she gets yeast infections frequently.  Call disconnected, called pt back and lvm to call back.

## 2023-06-01 NOTE — Telephone Encounter (Addendum)
 Spoke with patient. Patient given nurse visit appointment for swab on  06/02/2023.

## 2023-06-01 NOTE — Telephone Encounter (Signed)
 Chief Complaint: vaginal symptoms Symptoms: burning Frequency: Constant Pertinent Negatives: Patient denies fever, new symptoms Disposition: [] ED /[] Urgent Care (no appt availability in office) / [x] Appointment(In office/virtual)/ []  [facility]/ [] Home Care/ [x] Refused Recommended Disposition /[] [facility]/ []  Follow-up with PCP Additional Notes:  4 days of yeast infection symptoms. Used fluconazole  in the past with effect, she has no refills remaining. Requesting prescription to be called in. Patient called earlier today and refill requests pending, see refill encounter. Call disconnected during triage, attempts to call back are unsuccessful. Please follow up with patient re: refill.   Reason for Disposition  [1] Symptoms of a yeast infection (i.e., itchy, white discharge, not bad smelling) AND [2] not improved > 3 days following Care Advice  Protocols used: Vaginal Symptoms-A-AH

## 2023-06-02 ENCOUNTER — Telehealth: Payer: Self-pay

## 2023-06-02 ENCOUNTER — Other Ambulatory Visit (HOSPITAL_COMMUNITY)
Admission: RE | Admit: 2023-06-02 | Discharge: 2023-06-02 | Disposition: A | Discharge status: Home / Self Care | Source: Ambulatory Visit | Attending: Nurse Practitioner | Admitting: Nurse Practitioner

## 2023-06-02 ENCOUNTER — Ambulatory Visit: Attending: Nurse Practitioner

## 2023-06-02 DIAGNOSIS — Z113 Encounter for screening for infections with a predominantly sexual mode of transmission: Secondary | ICD-10-CM

## 2023-06-02 NOTE — Telephone Encounter (Signed)
 Patient in for vag. Swab. Patient requested that if she requires medication based on results that it be sent to   [facility]  Address: [address] [place], [place], [place] [address]

## 2023-06-02 NOTE — Telephone Encounter (Signed)
 Requested medication (s) are due for refill today: Yes  Requested medication (s) are on the active medication list: Yes  Last refill:  04/07/22 and 04/27/23  Future visit scheduled: No  Notes to clinic:  Unable to refill due to no refill protocol for this medication for fluconazole ; gabapentin  30 day supply given, unsure if provider wants to refill, sending for approval.     Requested Prescriptions  Pending Prescriptions Disp Refills   fluconazole  (DIFLUCAN ) 150 MG tablet 27 tablet 0    Sig: Take 1 tablet every 72 hours for 3 doses then take 1 tablet weekly for the next 6 months.     Off-Protocol Failed - 06/02/2023 10:30 PM      Failed - Medication not assigned to a protocol, review manually.      Passed - Valid encounter within last 12 months    Recent Outpatient Visits           Today Screening examination for STI   [facility] - A Dept Of [place]. [facility]   6 months ago Dental infection   [facility] - A Dept Of [place]. [facility] [place], [place] W, NP   10 months ago Type 2 diabetes mellitus with diabetic peripheral angiopathy without gangrene, without long-term current use of insulin  [facility])   [facility] - A Dept Of [place]. [facility] Collins Dean, NP   1 year ago Primary hypertension   [facility] - A Dept Of [place]. [facility] Collins Dean, NP   1 year ago Acute non-recurrent maxillary sinusitis   [facility] - A Dept Of [place]. [facility] Adan Holms, Lavelle Posey, MD               gabapentin  (NEURONTIN ) 600 MG tablet 90 tablet 0    Sig: Take 1 tablet (600 mg total) by mouth 3 (three) times daily.     Neurology: Anticonvulsants - gabapentin  Failed - 06/02/2023 10:30 PM      Failed - Completed PHQ-2 or PHQ-9 in the last 360 days      Passed - Cr in normal range and within 360 days    Creatinine, Ser   Date Value Ref Range Status  01/27/2023 0.73 0.44 - 1.00 mg/dL Final         Passed - Valid encounter within last 12 months    Recent Outpatient Visits           Today Screening examination for STI   [facility] - A Dept Of [place]. [facility]   6 months ago Dental infection   [facility] - A Dept Of [place]. [facility] [place], [place] W, NP   10 months ago Type 2 diabetes mellitus with diabetic peripheral angiopathy without gangrene, without long-term current use of insulin  [facility])   [facility] - A Dept Of [place]. [facility] Collins Dean, NP   1 year ago Primary hypertension   [facility] - A Dept Of [place]. [facility] Collins Dean, NP   1 year ago Acute non-recurrent maxillary sinusitis   [facility] - A Dept Of [place]. [facility] Joaquin Mulberry, MD

## 2023-06-02 NOTE — Progress Notes (Unsigned)
 Patient in for Vag swab.

## 2023-06-03 MED ORDER — GABAPENTIN 600 MG PO TABS
600.0000 mg | ORAL_TABLET | Freq: Three times a day (TID) | ORAL | 0 refills | Status: DC
  Filled 2023-06-03: qty 90, 30d supply, fill #0

## 2023-06-03 MED ORDER — FLUCONAZOLE 150 MG PO TABS
ORAL_TABLET | ORAL | 0 refills | Status: DC
  Filled 2023-06-03: qty 6, 30d supply, fill #0

## 2023-06-05 ENCOUNTER — Other Ambulatory Visit: Payer: Self-pay

## 2023-06-05 LAB — CERVICOVAGINAL ANCILLARY ONLY
Bacterial Vaginitis (gardnerella): POSITIVE — AB
Candida Glabrata: POSITIVE — AB
Candida Vaginitis: NEGATIVE
Chlamydia: NEGATIVE
Comment: NEGATIVE
Comment: NEGATIVE
Comment: NEGATIVE
Comment: NEGATIVE
Comment: NEGATIVE
Comment: NORMAL
Neisseria Gonorrhea: NEGATIVE
Trichomonas: NEGATIVE

## 2023-06-06 ENCOUNTER — Other Ambulatory Visit: Payer: Self-pay | Admitting: Nurse Practitioner

## 2023-06-06 ENCOUNTER — Ambulatory Visit: Payer: Self-pay | Admitting: Nurse Practitioner

## 2023-06-06 DIAGNOSIS — B9689 Other specified bacterial agents as the cause of diseases classified elsewhere: Secondary | ICD-10-CM

## 2023-06-06 MED ORDER — METRONIDAZOLE 500 MG PO TABS
500.0000 mg | ORAL_TABLET | Freq: Two times a day (BID) | ORAL | 0 refills | Status: AC
  Filled 2023-06-06: qty 14, 7d supply, fill #0

## 2023-06-07 ENCOUNTER — Other Ambulatory Visit: Payer: Self-pay

## 2023-06-09 ENCOUNTER — Ambulatory Visit: Payer: Self-pay

## 2023-06-09 NOTE — Telephone Encounter (Signed)
 FYI Only or Action Required?: Action required by provider  Patient was last seen in primary care on 11/16/2022 by Collins Dean, NP. Called Nurse Triage reporting Allergic Reaction. Symptoms began today. Interventions attempted: Other: stopped medication. Symptoms are: gradually improving.  Triage Disposition: Call PCP Within 24 Hours  Patient/caregiver understands and will follow disposition?: No, wishes to speak with PCP  Copied from CRM [id number]. Topic: Clinical - Red Word Triage >> Jun 09, 2023  4:37 PM Ethelle Herb L wrote: Red Word that prompted transfer to Nurse Triage: pt prescribed flagyl , pt allergic to it. Pt did take a dose and having reaction to medication. Reason for Disposition  Taking prescription medication that could cause nausea (e.g., narcotics/opiates, antibiotics, OCPs, many others)  Answer Assessment - Initial Assessment Questions 1. NAUSEA SEVERITY: "How bad is the nausea?" (e.g., mild, moderate, severe; dehydration, weight loss)   - MILD: loss of appetite without change in eating habits   - MODERATE: decreased oral intake without significant weight loss, dehydration, or malnutrition   - SEVERE: inadequate caloric or fluid intake, significant weight loss, symptoms of dehydration     severe 2. ONSET: "When did the nausea begin?"     yesterday 3. VOMITING: "Any vomiting?" If Yes, ask: "How many times today?"     No 4. RECURRENT SYMPTOM: "Have you had nausea before?" If Yes, ask: "When was the last time?" "What happened that time?"     NA 5. 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   Additional info: Stopped med Due to late time of day this writer attempted to call [facility] but no answer. High priority message sent.  Protocols used: Nausea-A-AH

## 2023-06-15 NOTE — Telephone Encounter (Signed)
 Copied from CRM [id number]. Topic: Clinical - Red Word Triage >> Jun 09, 2023  4:37 PM Ethelle Herb L wrote:  Red Word that prompted transfer to Nurse Triage: pt prescribed flagyl , pt allergic to it. Pt did take a dose and having reaction to medication.  >> Jun 15, 2023  4:57 PM Fredrica W wrote: Patient called back returned missed call. Call was to schedule appt. Patient states she was just seen and just needs provider to send in another medication because she is allergic to what was prescribed. Called [facility] - 1st appt 7/16. Thank You

## 2023-06-16 ENCOUNTER — Other Ambulatory Visit: Payer: Self-pay

## 2023-06-16 NOTE — Telephone Encounter (Signed)
 Duplicate message

## 2023-06-20 ENCOUNTER — Other Ambulatory Visit (HOSPITAL_COMMUNITY): Payer: Self-pay

## 2023-06-20 ENCOUNTER — Other Ambulatory Visit: Payer: Self-pay

## 2023-06-22 ENCOUNTER — Encounter: Payer: Self-pay | Admitting: Pharmacist

## 2023-06-22 ENCOUNTER — Other Ambulatory Visit (HOSPITAL_BASED_OUTPATIENT_CLINIC_OR_DEPARTMENT_OTHER): Payer: Self-pay | Admitting: Pharmacist

## 2023-06-22 ENCOUNTER — Other Ambulatory Visit: Payer: Self-pay

## 2023-06-22 DIAGNOSIS — Z794 Long term (current) use of insulin: Secondary | ICD-10-CM

## 2023-06-22 DIAGNOSIS — E119 Type 2 diabetes mellitus without complications: Secondary | ICD-10-CM

## 2023-06-22 DIAGNOSIS — Z7984 Long term (current) use of oral hypoglycemic drugs: Secondary | ICD-10-CM

## 2023-06-22 NOTE — Progress Notes (Signed)
 Pharmacy TNM Diabetes Measure Review  S:  Patient was identified in a report as being at risk for failing the True [place] Metric of A1c control (<8%) in [place] and African American patients. Last A1c was 12.7. Last PCP visit was 11/16/2022.  Call placed to patient to discuss diabetes control and medication management. Left HIPAA-compliant VM with instructions for follow-up.  Current diabetes medications include: empagliflozin  10 mg daily, Lantus  40u daily, Humaloh 30u daily  Dispense history:  -Jardiance : 06/20/2023 for a 90-day -Lantus : last dispensed 03/16/2023 for a 90-day supply -Humalog : last dispensed 05/16/2023 for a 30-day supply  Insurance coverage: [employer]  O:   Lab Results  Component Value Date   HGBA1C 12.7 (A) 03/31/2023   There were no vitals filed for this visit.  Lipid Panel     Component Value Date/Time   CHOL 134 02/10/2021 1207   TRIG 54 02/10/2021 1207   HDL 65 02/10/2021 1207   CHOLHDL 2.1 02/10/2021 1207   LDLCALC 57 02/10/2021 1207    Clinical Atherosclerotic Cardiovascular Disease (ASCVD): No  The 10-year ASCVD risk score (Arnett DK, et al., 2019) is: 8.9%   Values used to calculate the score:     Age: 53 years     Clincally relevant sex: Female     Is Non-Hispanic African American: Yes     Diabetic: Yes     Tobacco smoker: Yes     Systolic Blood Pressure: 120 mmHg     Is BP treated: Yes     HDL Cholesterol: 65 mg/dL     Total Cholesterol: 134 mg/dL   Patient is participating in a Managed Medicaid Plan: No   A/P: Diabetes longstanding currently uncontrolled. On review of her dispense hx, medication adherence has improved since I touched base with her in March. I was unable to connect with her, so I left a HIPAA-compliant VM with instructions for follow-up. Thankfully, she does have an Endocrinology appt scheduled later this month (06/28/23).  -Next A1c anticipated later this month.   Follow-up:  Endocrinology visit 06/28/23.   Marene Shape, PharmD, Becky Bowels, CPP Clinical Pharmacist [facility] [phone]

## 2023-06-28 ENCOUNTER — Ambulatory Visit: Admitting: Internal Medicine

## 2023-06-28 NOTE — Progress Notes (Deleted)
 Name: Denise Hale  MRN/ DOB: [id number], 03-13-1970   Age/ Sex: 53 y.o., female    PCP: Theotis Haze ORN, NP   Reason for Endocrinology Evaluation: Type 2 Diabetes Mellitus     Date of Initial Endocrinology Visit: 11/10/2022    PATIENT IDENTIFIER: Denise Hale is a 53 y.o. female with a past medical history of DM, . The patient presented for initial endocrinology clinic visit on 11/10/2022 for consultative assistance with her diabetes management.    HPI: Denise Hale was    Diagnosed with DM 16 yrs ago  Prior Medications tried/Intolerance: Metformin-diarrhea, tradjenta - no intolerance. Trulicity  - extreme weight loss and GI issues                   Hemoglobin A1c has ranged from 6.6% in 2023, peaking at 12.3% in 2024.   On her initial visit to our clinic her A1c was 11.7%, she was on glipizide  XL 10 mg and Humalog  mix.  I stopped glipizide  and started her on Jardiance .  I also switched insulin  mix to basal/prandial.  A prescription for freestyle libre 3 was sent to the pharmacy at the time.      SUBJECTIVE:   During the last visit (03/31/2023): A1c 12.7%  Today (06/28/23): Denise Hale is here for follow-up on diabetes management.  She had a prescription freestyle libre, but she was having sensory issues and using fingersticks in between .she checks blood sugars 1-2 times daily. The patient has had hypoglycemic episodes since the last clinic visit.    No polydipsia nor frequency recently  Has mild constipating  Denies nausea or vomiting   HOME DIABETES REGIMEN: Jardiance  25 mg daily Lantus  40 units daily NovoLog  6 units 3 times daily before every meal CF: NovoLog (BG -130/30) 3 times daily before every meal   Statin: yes ACE-I/ARB: Yes   METER DOWNLOAD SUMMARY:  44- 408 mg/dL   CONTINUOUS GLUCOSE MONITORING RECORD INTERPRETATION    Dates of Recording: 3/15 - 03/31/2023  Sensor description: [employer]  Results statistics:   CGM use % of time 5%   Average and SD 290/19.1  Time in range      0  %  % Time Above 180 31  % Time above 250 69  % Time Below target 0   Glycemic patterns summary: BGs have been elevated throughout the day and night  Hyperglycemic episodes postprandial  Hypoglycemic episodes occurred N/A on freestyle libre download  Overnight periods: High    DIABETIC COMPLICATIONS: Microvascular complications:  Neuropathy, DR  Denies: CKD Last eye exam: Completed 11/2020  Macrovascular complications:   Denies: CAD, PVD, CVA   PAST HISTORY: Past Medical History:  Past Medical History:  Diagnosis Date   Anxiety    Asthma    Diverticulitis    DM (diabetes mellitus), type 2 with ophthalmic complications (HCC)    GERD (gastroesophageal reflux disease)    Severe nonproliferative diabetic retinopathy (HCC)    Past Surgical History:  Past Surgical History:  Procedure Laterality Date   CARPAL TUNNEL RELEASE Right 04/08/2020   Procedure: RIGHT CARPAL TUNNEL RELEASE;  Surgeon: Jerri Kay HERO, MD;  Location: [facility];  Service: Orthopedics;  Laterality: Right;   CHOLECYSTECTOMY     COLON SURGERY      Social History:  reports that she has been smoking cigars. She has never used smokeless tobacco. She reports that she does not currently use alcohol. She reports current drug use. Drug: Marijuana. Family History:  Family  History  Problem Relation Age of Onset   Diabetes Mother    Thyroid disease Mother    Heart disease Father    Breast cancer Paternal Aunt    Breast cancer Paternal Aunt      HOME MEDICATIONS: Allergies as of 06/28/2023       Reactions   Ciprofloxacin    Metformin And Related Diarrhea        Medication List        Accurate as of June 28, 2023  7:26 AM. If you have any questions, ask your nurse or doctor.          atorvastatin  20 MG tablet Commonly known as: LIPITOR Take 1 tablet (20 mg total) by mouth daily. FOR CHOLESTEROL   BD Pen Needle Nano U/F 32G X 4  MM Misc Generic drug: Insulin  Pen Needle Use in the morning, at noon, in the evening, and at bedtime.   chlorhexidine  0.12 % solution Commonly known as: Peridex  Use as directed 15 mLs in the mouth or throat 2 (two) times daily.   citalopram  20 MG tablet Commonly known as: CELEXA  Take 1 tablet (20 mg total) by mouth daily. For depression   Cymbalta  30 MG capsule Generic drug: DULoxetine  Take 1 capsule every day by oral route.   DSS 100 MG Caps Take by mouth.   fluconazole  150 MG tablet Commonly known as: DIFLUCAN  Take 1 tablet every 72 hours for 3 doses then take 1 tablet weekly for the next 6 months.   FreeStyle Libre 3 Plus Sensor Misc Change sensor every 15 days.   FreeStyle Libre 3 Reader [place] Use to check blood sugar continuously throughout the day.   gabapentin  600 MG tablet Commonly known as: Neurontin  Take 1 tablet (600 mg total) by mouth 3 (three) times daily.   insulin  lispro 100 UNIT/ML KwikPen Commonly known as: HumaLOG  KwikPen inject a max of 30 units daily as directed   Jardiance  10 MG Tabs tablet Generic drug: empagliflozin  Take 1 tablet (10 mg total) by mouth daily before breakfast.   lansoprazole  30 MG capsule Commonly known as: PREVACID  Take 1 capsule (30 mg total) by mouth daily at 12 noon. FOR ACID REFLUX   Lantus  SoloStar 100 UNIT/ML Solostar Pen Generic drug: insulin  glargine Inject 40 Units into the skin daily.   linaclotide  72 MCG capsule Commonly known as: Linzess  Take 1 capsule (72 mcg total) by mouth daily before breakfast. For constipation   lisinopril  2.5 MG tablet Commonly known as: ZESTRIL  Take by mouth.   lisinopril  5 MG tablet Commonly known as: ZESTRIL  Take 1 tablet (5 mg total) by mouth daily.   Na Sulfate-K Sulfate-Mg Sulfate concentrate 17.5-3.13-1.6 GM/177ML Soln Commonly known as: SUPREP See admin instructions.   NovoLOG  FlexPen 100 UNIT/ML FlexPen Generic drug: insulin  aspart inject a max of 30 unitd daily as  directed   nystatin -triamcinolone  ointment Commonly known as: MYCOLOG APPLY THIN FILM TO EXTERNAL VAGINAL AREA DAILY X 5 DAYS. DO NOT INSERT INTO VAGINA   ondansetron  4 MG disintegrating tablet Commonly known as: ZOFRAN -ODT Take 1 tablet (4 mg total) by mouth every 8 (eight) hours as needed for nausea or vomiting.   OneTouch Verio Reflect w/Device Kit Check blood glucose level by fingerstick twice per day. Dx: E11.65   pantoprazole  40 MG tablet Commonly known as: PROTONIX  Take 1 tablet every day by oral route.   tiZANidine  4 MG tablet Commonly known as: ZANAFLEX  Take 1 tablet (4 mg total) by mouth every 6 (six) hours as  needed for muscle spasms.   TRUEplus Lancets 28G Misc Use as instructed. Check blood glucose level by fingerstick twice per day.   E11.65         ALLERGIES: Allergies  Allergen Reactions   Ciprofloxacin    Metformin And Related Diarrhea     REVIEW OF SYSTEMS: A comprehensive ROS was conducted with the patient and is negative except as per HPI    OBJECTIVE:   VITAL SIGNS: There were no vitals taken for this visit.   PHYSICAL EXAM:  General: Pt appears well and is in NAD  Neck: General: Supple without adenopathy or carotid bruits. Thyroid: Thyroid size normal.  No goiter or nodules appreciated.   Lungs: Clear with good BS bilat   Heart: RRR   Abdomen:  soft, nontender  Extremities:  Lower extremities - No pretibial edema.   Skin:  No rash noted.  Neuro: MS is good with appropriate affect, pt is alert and Ox3    DM foot exam: 11/10/2022  The skin of the feet is intact without sores or ulcerations. The pedal pulses are 2+ on right and 2+ on left. The sensation is intact to a screening 5.07, 10 gram monofilament bilaterally    DATA REVIEWED:  Lab Results  Component Value Date   HGBA1C 12.7 (A) 03/31/2023   HGBA1C 11.7 (A) 11/10/2022   HGBA1C 11.9 (A) 07/13/2022     ASSESSMENT / PLAN / RECOMMENDATIONS:   1) Type 2 Diabetes  Mellitus, Poorly controlled, With Neuropathic and retinopathic  complications - Most recent A1c of 12.7 %. Goal A1c < 7.0 %.     -Patient continues with hyperglycemia, unfortunately she has not had her glycemic agents in months due to cost, she was recently able to get them from the [facility] pharmacy at a good price -She has not been consistent with prandial dose of insulin , I did encourage the patient to take prandial insulin  with each meal -No changes to Lantus  dose at this time -I will increase Jardiance  as below - Intolerant to metformin - Intolerant to Trulicity  as well as did not like extreme weight loss   MEDICATIONS:  Increase Jardiance  25 mg daily Continue  Lantus  40 units daily Continue NovoLog  6 units 3 times daily before every meal Continue CF: Novolog  (BG-130/30) TIDQAC  EDUCATION / INSTRUCTIONS: BG monitoring instructions: Patient is instructed to check her blood sugars 3 times a day. Call  Endocrinology clinic if: BG persistently < 70  I reviewed the Rule of 15 for the treatment of hypoglycemia in detail with the patient. Literature supplied.   2) Diabetic complications:  Eye: Does  have known diabetic retinopathy.  Neuro/ Feet: Does  have known diabetic peripheral neuropathy. Renal: Patient does not have known baseline CKD. She is  on an ACEI/ARB at present.     Follow-up in 3 months  Signed electronically by: Stefano Redgie Butts, MD  [facility] Endocrinology  [facility] [address] [place], [place] [address] Phone: [phone] FAX: [phone]   CC: Theotis Haze ORN, NP [address] [place] [address] [place] [address] Phone: [phone]  Fax: [phone]    Return to Endocrinology clinic as below: Future Appointments  Date Time Provider Department Center  06/28/2023  8:50 AM Bolden Hagerman, Donell Redgie, MD [facility] None

## 2023-07-05 ENCOUNTER — Other Ambulatory Visit: Payer: Self-pay

## 2023-07-12 ENCOUNTER — Telehealth: Payer: Self-pay | Admitting: Nurse Practitioner

## 2023-07-12 NOTE — Telephone Encounter (Signed)
 Form has been signed.

## 2023-07-12 NOTE — Telephone Encounter (Signed)
 Copied from CRM [id number]. Topic: General - Other >> Jul 11, 2023 12:49 PM Corin V wrote:  Reason for CRM: Courtney faxed paperwork for clearance for dental procedure tomorrow and they have still not received it back. Please verify if paperwork has been received and send back today to fax number on paperwork. Please call Charmaine if paperwork was not received. Call back is [phone]  >> Jul 11, 2023  4:26 PM Turkey B wrote: Charmaine from [facility], says this is 2nd attempt for request for medical clearance. She states was fxed at first, on  the first part of June. She states pt appt is tomorrow. Please cb asap

## 2023-07-13 NOTE — Telephone Encounter (Signed)
 Form faxed

## 2023-07-18 ENCOUNTER — Other Ambulatory Visit (HOSPITAL_COMMUNITY): Payer: Self-pay

## 2023-07-18 ENCOUNTER — Other Ambulatory Visit: Payer: Self-pay | Admitting: Family Medicine

## 2023-07-18 ENCOUNTER — Other Ambulatory Visit: Payer: Self-pay

## 2023-07-18 ENCOUNTER — Other Ambulatory Visit: Payer: Self-pay | Admitting: Nurse Practitioner

## 2023-07-18 DIAGNOSIS — F419 Anxiety disorder, unspecified: Secondary | ICD-10-CM

## 2023-07-18 DIAGNOSIS — E785 Hyperlipidemia, unspecified: Secondary | ICD-10-CM

## 2023-07-18 DIAGNOSIS — Z794 Long term (current) use of insulin: Secondary | ICD-10-CM

## 2023-07-18 MED ORDER — CITALOPRAM HYDROBROMIDE 20 MG PO TABS
20.0000 mg | ORAL_TABLET | Freq: Every day | ORAL | 0 refills | Status: DC
  Filled 2023-07-18 – 2023-08-18 (×2): qty 30, 30d supply, fill #0

## 2023-07-18 MED ORDER — ATORVASTATIN CALCIUM 20 MG PO TABS
20.0000 mg | ORAL_TABLET | Freq: Every day | ORAL | 0 refills | Status: DC
  Filled 2023-07-18 – 2023-08-11 (×2): qty 30, 30d supply, fill #0

## 2023-07-18 MED ORDER — GABAPENTIN 600 MG PO TABS
600.0000 mg | ORAL_TABLET | Freq: Three times a day (TID) | ORAL | 0 refills | Status: DC
  Filled 2023-07-18 – 2023-08-18 (×2): qty 90, 30d supply, fill #0

## 2023-07-18 NOTE — Telephone Encounter (Signed)
 Copied from CRM [id number]. Topic: General - Other >> Jul 18, 2023  9:40 AM Selinda RAMAN wrote:  Reason for CRM: Charmaine with [facility] dental care called in regarding the patients medical clearance for an upcoming procedure. She said she cannot clear the patient as she is not the one managing her diabetes. Please assist patient further as I was told by Nurse Triage to send a CRM to clinical. Charmaine can be reached at [phone]

## 2023-07-18 NOTE — Telephone Encounter (Signed)
 Spoke with [name] to inform her that patients A1C is being managed by a provider at the endocrinologist office. The contact information would have to come form the patient.

## 2023-07-27 ENCOUNTER — Ambulatory Visit: Admitting: Internal Medicine

## 2023-07-27 ENCOUNTER — Other Ambulatory Visit: Payer: Self-pay

## 2023-08-11 ENCOUNTER — Encounter: Payer: Self-pay | Admitting: Internal Medicine

## 2023-08-11 ENCOUNTER — Other Ambulatory Visit: Payer: Self-pay

## 2023-08-11 ENCOUNTER — Ambulatory Visit (INDEPENDENT_AMBULATORY_CARE_PROVIDER_SITE_OTHER): Admitting: Internal Medicine

## 2023-08-11 VITALS — BP 120/80 | HR 85 | Ht 66.0 in | Wt 135.0 lb

## 2023-08-11 DIAGNOSIS — N761 Subacute and chronic vaginitis: Secondary | ICD-10-CM

## 2023-08-11 DIAGNOSIS — E113499 Type 2 diabetes mellitus with severe nonproliferative diabetic retinopathy without macular edema, unspecified eye: Secondary | ICD-10-CM

## 2023-08-11 DIAGNOSIS — E1165 Type 2 diabetes mellitus with hyperglycemia: Secondary | ICD-10-CM

## 2023-08-11 DIAGNOSIS — K5900 Constipation, unspecified: Secondary | ICD-10-CM

## 2023-08-11 DIAGNOSIS — I10 Essential (primary) hypertension: Secondary | ICD-10-CM

## 2023-08-11 DIAGNOSIS — K219 Gastro-esophageal reflux disease without esophagitis: Secondary | ICD-10-CM

## 2023-08-11 DIAGNOSIS — Z794 Long term (current) use of insulin: Secondary | ICD-10-CM

## 2023-08-11 DIAGNOSIS — Z7984 Long term (current) use of oral hypoglycemic drugs: Secondary | ICD-10-CM

## 2023-08-11 DIAGNOSIS — E1142 Type 2 diabetes mellitus with diabetic polyneuropathy: Secondary | ICD-10-CM | POA: Diagnosis not present

## 2023-08-11 LAB — POCT GLYCOSYLATED HEMOGLOBIN (HGB A1C): Hemoglobin A1C: 9.8 % — AB (ref 4.0–5.6)

## 2023-08-11 MED ORDER — FLUCONAZOLE 150 MG PO TABS
150.0000 mg | ORAL_TABLET | Freq: Once | ORAL | 1 refills | Status: AC
  Filled 2023-08-11: qty 1, 1d supply, fill #0

## 2023-08-11 MED ORDER — EMPAGLIFLOZIN 25 MG PO TABS
25.0000 mg | ORAL_TABLET | Freq: Every day | ORAL | 3 refills | Status: AC
  Filled 2023-08-11: qty 90, 90d supply, fill #0
  Filled 2023-11-17: qty 90, 90d supply, fill #1

## 2023-08-11 MED ORDER — GLIPIZIDE 10 MG PO TABS
10.0000 mg | ORAL_TABLET | Freq: Two times a day (BID) | ORAL | 3 refills | Status: DC
  Filled 2023-08-11: qty 180, 90d supply, fill #0
  Filled 2023-11-17: qty 180, 90d supply, fill #1

## 2023-08-11 NOTE — Patient Instructions (Addendum)
 Do not take NovoLog  6 units with each meal but continue to use the correction scale before each meal if your sugar is over 160 In the meantime take glipizide  10 mg, 1 tablet before breakfast and 1 tablet before supper, if your sugars are low please let us  know so we can adjust the dose, but if your sugars continue to be very high please go back to taking NovoLog  6 units with each meal plus sliding scale  Take Jardiance  25 mg, 1 tablet every morning  Continue Lantus  40 units ONCE daily  Novolog  correctional insulin : ADD extra units on insulin  to your meal-time Novolog  dose if your blood sugars are higher than 160. Use the scale below to help guide you:   Blood sugar before meal Number of units to inject  Less than 160 0 unit  161 -  190 1 units  191 -  220 2 units  221 -  250 3 units  251 -  280 4 units  281 -  310 5 units  311 -  340 6 units  341 -  370 7 units  371 -  400 8 units    HOW TO TREAT LOW BLOOD SUGARS (Blood sugar LESS THAN 70 MG/DL) Please follow the RULE OF 15 for the treatment of hypoglycemia treatment (when your (blood sugars are less than 70 mg/dL)   STEP 1: Take 15 grams of carbohydrates when your blood sugar is low, which includes:  3-4 GLUCOSE TABS  OR 3-4 OZ OF JUICE OR REGULAR SODA OR ONE TUBE OF GLUCOSE GEL    STEP 2: RECHECK blood sugar in 15 MINUTES STEP 3: If your blood sugar is still low at the 15 minute recheck --> then, go back to STEP 1 and treat AGAIN with another 15 grams of carbohydrates.

## 2023-08-11 NOTE — Progress Notes (Signed)
 Name: Denise Hale  MRN/ DOB: [id number], 1970/01/24   Age/ Sex: 53 y.o., female    PCP: Theotis Haze ORN, NP   Reason for Endocrinology Evaluation: Type 2 Diabetes Mellitus     Date of Initial Endocrinology Visit: 11/10/2022    PATIENT IDENTIFIER: Denise Hale is a 53 y.o. female with a past medical history of DM, . The patient presented for initial endocrinology clinic visit on 11/10/2022 for consultative assistance with her diabetes management.    HPI: Denise Hale was    Diagnosed with DM 16 yrs ago  Prior Medications tried/Intolerance: Metformin-diarrhea, tradjenta - no intolerance. Trulicity  - extreme weight loss and GI issues                   Hemoglobin A1c has ranged from 6.6% in 2023, peaking at 12.3% in 2024.   On her initial visit to our clinic her A1c was 11.7%, she was on glipizide  XL 10 mg and Humalog  mix.  I stopped glipizide  and started her on Jardiance .  I also switched insulin  mix to basal/prandial.  A prescription for freestyle libre 3 was sent to the pharmacy at the time.      SUBJECTIVE:   During the last visit (03/31/2023): A1c 12.7%    Today (08/11/23): Denise Hale is here for follow-up on diabetes management.  She had a prescription freestyle libre, but she was having sensory issues and using fingersticks in between .she checks blood sugars 1-2 times daily. The patient has had hypoglycemic episodes since the last clinic visit.   She is c/o vaginal irritation for the past 2 weeks  She continues with constipation  Has occasional nausea and abdominal discomfit She is also having acid reflux symptoms  She has back issues with right leg  pain that started a few weeks ago, sharp and constant in nature. Untriggering factor. Feels like ccramp        HOME DIABETES REGIMEN: Jardiance  25 mg daily Lantus  40 units daily NovoLog  6 units 3 times daily before every meal CF: NovoLog (BG -130/30) 3 times daily before every meal   Statin: yes ACE-I/ARB:  Yes   METER DOWNLOAD SUMMARY:    CONTINUOUS GLUCOSE MONITORING RECORD INTERPRETATION    Dates of Recording: 7/26-08/11/2023  Sensor description: Freestyle libre  Results statistics:   CGM use % of time 60  Average and SD 177/37.2  Time in range 63%  % Time Above 180 19  % Time above 250 18  % Time Below target 0   Glycemic patterns summary: BG's trend down overnight and increased throughout the day  Hyperglycemic episodes postprandial  Hypoglycemic episodes occurred N/A  Overnight periods: Trends down    DIABETIC COMPLICATIONS: Microvascular complications:  Neuropathy, DR  Denies: CKD Last eye exam: Completed 11/2020  Macrovascular complications:   Denies: CAD, PVD, CVA   PAST HISTORY: Past Medical History:  Past Medical History:  Diagnosis Date   Anxiety    Asthma    Diverticulitis    DM (diabetes mellitus), type 2 with ophthalmic complications (HCC)    GERD (gastroesophageal reflux disease)    Severe nonproliferative diabetic retinopathy (HCC)    Past Surgical History:  Past Surgical History:  Procedure Laterality Date   CARPAL TUNNEL RELEASE Right 04/08/2020   Procedure: RIGHT CARPAL TUNNEL RELEASE;  Surgeon: Jerri Kay HERO, MD;  Location: [facility];  Service: Orthopedics;  Laterality: Right;   CHOLECYSTECTOMY     COLON SURGERY      Social History:  reports  that she has been smoking cigars. She has never used smokeless tobacco. She reports that she does not currently use alcohol. She reports current drug use. Drug: Marijuana. Family History:  Family History  Problem Relation Age of Onset   Diabetes Mother    Thyroid disease Mother    Heart disease Father    Breast cancer Paternal Aunt    Breast cancer Paternal Aunt      HOME MEDICATIONS: Allergies as of 08/11/2023       Reactions   Ciprofloxacin    Metformin And Related Diarrhea        Medication List        Accurate as of August 11, 2023  9:48 AM. If you have any  questions, ask your nurse or doctor.          atorvastatin  20 MG tablet Commonly known as: LIPITOR Take 1 tablet (20 mg total) by mouth daily. Must have office visit for refills   BD Pen Needle Nano U/F 32G X 4 MM Misc Generic drug: Insulin  Pen Needle Use in the morning, at noon, in the evening, and at bedtime.   chlorhexidine  0.12 % solution Commonly known as: Peridex  Use as directed 15 mLs in the mouth or throat 2 (two) times daily.   citalopram  20 MG tablet Commonly known as: CELEXA  Take 1 tablet (20 mg total) by mouth daily. Must have office visit for refills   Cymbalta  30 MG capsule Generic drug: DULoxetine  Take 1 capsule every day by oral route.   DSS 100 MG Caps Take by mouth.   fluconazole  150 MG tablet Commonly known as: DIFLUCAN  Take 1 tablet every 72 hours for 3 doses then take 1 tablet weekly for the next 6 months.   FreeStyle Libre 3 Plus Sensor Misc Change sensor every 15 days.   FreeStyle Libre 3 Reader Rives Use to check blood sugar continuously throughout the day.   gabapentin  600 MG tablet Commonly known as: Neurontin  Take 1 tablet (600 mg total) by mouth 3 (three) times daily.   insulin  lispro 100 UNIT/ML KwikPen Commonly known as: HumaLOG  KwikPen inject a max of 30 units daily as directed   Jardiance  10 MG Tabs tablet Generic drug: empagliflozin  Take 1 tablet (10 mg total) by mouth daily before breakfast.   lansoprazole  30 MG capsule Commonly known as: PREVACID  Take 1 capsule (30 mg total) by mouth daily at 12 noon. FOR ACID REFLUX   Lantus  SoloStar 100 UNIT/ML Solostar Pen Generic drug: insulin  glargine Inject 40 Units into the skin daily.   linaclotide  72 MCG capsule Commonly known as: Linzess  Take 1 capsule (72 mcg total) by mouth daily before breakfast. For constipation   lisinopril  2.5 MG tablet Commonly known as: ZESTRIL  Take by mouth.   lisinopril  5 MG tablet Commonly known as: ZESTRIL  Take 1 tablet (5 mg total) by mouth  daily.   Na Sulfate-K Sulfate-Mg Sulfate concentrate 17.5-3.13-1.6 GM/177ML Soln Commonly known as: SUPREP See admin instructions.   NovoLOG  FlexPen 100 UNIT/ML FlexPen Generic drug: insulin  aspart inject a max of 30 unitd daily as directed   nystatin -triamcinolone  ointment Commonly known as: MYCOLOG APPLY THIN FILM TO EXTERNAL VAGINAL AREA DAILY X 5 DAYS. DO NOT INSERT INTO VAGINA   ondansetron  4 MG disintegrating tablet Commonly known as: ZOFRAN -ODT Take 1 tablet (4 mg total) by mouth every 8 (eight) hours as needed for nausea or vomiting.   OneTouch Verio Reflect w/Device Kit Check blood glucose level by fingerstick twice per day. Dx: E11.65  pantoprazole  40 MG tablet Commonly known as: PROTONIX  Take 1 tablet every day by oral route.   tiZANidine  4 MG tablet Commonly known as: ZANAFLEX  Take 1 tablet (4 mg total) by mouth every 6 (six) hours as needed for muscle spasms.   TRUEplus Lancets 28G Misc Use as instructed. Check blood glucose level by fingerstick twice per day.   E11.65         ALLERGIES: Allergies  Allergen Reactions   Ciprofloxacin    Metformin And Related Diarrhea     REVIEW OF SYSTEMS: A comprehensive ROS was conducted with the patient and is negative except as per HPI    OBJECTIVE:   VITAL SIGNS: BP 120/80 (BP Location: Left Arm, Patient Position: Sitting, Cuff Size: Normal)   Pulse 85   Ht 5' 6 (1.676 m)   Wt 135 lb (61.2 kg)   SpO2 95%   BMI 21.79 kg/m    PHYSICAL EXAM:  General: Pt appears well and is in NAD  Lungs: Clear with good BS bilat   Heart: RRR   Abdomen:  soft, nontender  Extremities:  Lower extremities - No pretibial edema.   Neuro: MS is good with appropriate affect, pt is alert and Ox3    DM foot exam: 08/11/2023  The skin of the feet is intact without sores or ulcerations. The pedal pulses are 2+ on right and 2+ on left. The sensation is intact to a screening 5.07, 10 gram monofilament bilaterally    DATA  REVIEWED:  Lab Results  Component Value Date   HGBA1C 12.7 (A) 03/31/2023   HGBA1C 11.7 (A) 11/10/2022   HGBA1C 11.9 (A) 07/13/2022     Latest Reference Range & Units 08/11/23 10:25  Microalb, Ur mg/dL 85.3  MICROALB/CREAT RATIO <30 mg/g creat 203 (H)  Creatinine, Urine 20 - 275 mg/dL 72     Latest Reference Range & Units 08/11/23 10:25  Sodium 135 - 146 mmol/L 139  Potassium 3.5 - 5.3 mmol/L 4.2  Chloride 98 - 110 mmol/L 104  CO2 20 - 32 mmol/L 31  Glucose 65 - 99 mg/dL 855 (H)  BUN 7 - 25 mg/dL 14  Creatinine 9.49 - 8.96 mg/dL 9.44  Calcium  8.6 - 10.4 mg/dL 9.5  BUN/Creatinine Ratio 6 - 22 (calc) SEE NOTE:  eGFR > OR = 60 mL/min/1.74m2 110  Total CHOL/HDL Ratio <5.0 (calc) 1.7  Cholesterol <200 mg/dL 891  HDL Cholesterol > OR = 50 mg/dL 64  LDL Cholesterol (Calc) mg/dL (calc) 29  MICROALB/CREAT RATIO <30 mg/g creat 203 (H)  Non-HDL Cholesterol (Calc) <130 mg/dL (calc) 44  Triglycerides <150 mg/dL 73      Latest Reference Range & Units 08/11/23 10:25  Sodium 135 - 146 mmol/L 139  Potassium 3.5 - 5.3 mmol/L 4.2  Chloride 98 - 110 mmol/L 104  CO2 20 - 32 mmol/L 31  Glucose 65 - 99 mg/dL 855 (H)  BUN 7 - 25 mg/dL 14  Creatinine 9.49 - 8.96 mg/dL 9.44  Calcium  8.6 - 10.4 mg/dL 9.5  BUN/Creatinine Ratio 6 - 22 (calc) SEE NOTE:  eGFR > OR = 60 mL/min/1.16m2 110  Total CHOL/HDL Ratio <5.0 (calc) 1.7  Cholesterol <200 mg/dL 891  HDL Cholesterol > OR = 50 mg/dL 64  LDL Cholesterol (Calc) mg/dL (calc) 29  MICROALB/CREAT RATIO <30 mg/g creat 203 (H)  Non-HDL Cholesterol (Calc) <130 mg/dL (calc) 44  Triglycerides <150 mg/dL 73    Latest Reference Range & Units 08/11/23 10:25  Microalb, Ur mg/dL 14.6  MICROALB/CREAT RATIO <30 mg/g creat 203 (H)  Creatinine, Urine 20 - 275 mg/dL 72     ASSESSMENT / PLAN / RECOMMENDATIONS:   1) Type 2 Diabetes Mellitus, Poorly controlled, With Neuropathic and retinopathic  complications - Most recent A1c of 9.8 %. Goal A1c < 7.0 %.      - A1c down from 12.7% to 9.8% -She continues with hyperglycemia, she tends to not take NovoLog  on a consistent basis specially when she is at work -I have recommended holding NovoLog  6 units with the meals to be replaced by glipizide , but she should continue to use correction scale as needed -If she develops hypoglycemia, patient was encouraged to contact us  -If she continues with hyperglycemia on glipizide , the patient should go back to taking NovoLog  6 units plus correction scale as needed - Intolerant to metformin - Intolerant to Trulicity  as well as did not like extreme weight loss   MEDICATIONS:  Take Jardiance  25 mg daily Continue  Lantus  40 units daily Hold NovoLog  6 units 3 times daily before every meal Take glipizide  10 mg, 1 tablet before breakfast 1 tablet before supper Continue CF: Novolog  (BG-130/30) TIDQAC  EDUCATION / INSTRUCTIONS: BG monitoring instructions: Patient is instructed to check her blood sugars 3 times a day. Call [facility] if: BG persistently < 70  I reviewed the Rule of 15 for the treatment of hypoglycemia in detail with the patient. Literature supplied.   2) Diabetic complications:  Eye: Does  have known diabetic retinopathy.  Neuro/ Feet: Does  have known diabetic peripheral neuropathy. Renal: Patient does not have known baseline CKD. She is  on an ACEI/ARB at present.   3)GERD:  - She used to be on PPI, unknown reason for discontinuation - I have advised the patient to return to taking pantoprazole  and follow-up with PCP  4) Constipation:  - Patient to use MiraLAX as needed  5) Acute vaginitis:  - Unclear if this is due to persistent hyperglycemia versus Jardiance  or both - I have refilled Diflucan , if symptoms recur shortly after treatment or persist we will have to discontinue Jardiance     6) Microalbuminuria :  - Elevated but is trending down - Refill of lisinopril  5 mg daily was sent to the pharmacy   7)  Dyslipidemia  -At goal - No change   Follow-up in 3 months  Signed electronically by: Stefano Redgie Butts, MD  [facility]  [facility] [address] [place], [place] [address] Phone: [phone] FAX: [phone]   CC: Theotis Haze ORN, NP [address] [place] [address] Phone: [phone]  Fax: [phone]    Return to Endocrinology clinic as below: No future appointments.

## 2023-08-12 LAB — BASIC METABOLIC PANEL WITH GFR
BUN: 14 mg/dL (ref 7–25)
CO2: 31 mmol/L (ref 20–32)
Calcium: 9.5 mg/dL (ref 8.6–10.4)
Chloride: 104 mmol/L (ref 98–110)
Creat: 0.55 mg/dL (ref 0.50–1.03)
Glucose, Bld: 144 mg/dL — ABNORMAL HIGH (ref 65–99)
Potassium: 4.2 mmol/L (ref 3.5–5.3)
Sodium: 139 mmol/L (ref 135–146)
eGFR: 110 mL/min/1.73m2 (ref >=60)

## 2023-08-12 LAB — LIPID PANEL
Cholesterol: 108 mg/dL (ref <200)
HDL: 64 mg/dL (ref >=50)
LDL Cholesterol (Calc): 29 mg/dL
Non-HDL Cholesterol (Calc): 44 mg/dL (ref <130)
Total CHOL/HDL Ratio: 1.7 (calc) (ref <5.0)
Triglycerides: 73 mg/dL (ref <150)

## 2023-08-12 LAB — MICROALBUMIN / CREATININE URINE RATIO
Creatinine, Urine: 72 mg/dL (ref 20–275)
Microalb Creat Ratio: 203 mg/g{creat} — ABNORMAL HIGH (ref <30)
Microalb, Ur: 14.6 mg/dL

## 2023-08-14 ENCOUNTER — Other Ambulatory Visit: Payer: Self-pay

## 2023-08-14 ENCOUNTER — Ambulatory Visit: Payer: Self-pay | Admitting: Internal Medicine

## 2023-08-14 ENCOUNTER — Encounter: Payer: Self-pay | Admitting: Internal Medicine

## 2023-08-14 MED ORDER — LISINOPRIL 5 MG PO TABS
5.0000 mg | ORAL_TABLET | Freq: Every day | ORAL | 3 refills | Status: AC
Start: 2023-08-14 — End: ?
  Filled 2023-08-14 – 2023-09-14 (×2): qty 90, 90d supply, fill #0
  Filled 2023-12-14: qty 90, 90d supply, fill #1

## 2023-08-18 ENCOUNTER — Other Ambulatory Visit: Payer: Self-pay

## 2023-09-14 ENCOUNTER — Other Ambulatory Visit: Payer: Self-pay | Admitting: Nurse Practitioner

## 2023-09-14 ENCOUNTER — Ambulatory Visit: Payer: Self-pay

## 2023-09-14 ENCOUNTER — Other Ambulatory Visit: Payer: Self-pay | Admitting: Family Medicine

## 2023-09-14 ENCOUNTER — Other Ambulatory Visit: Payer: Self-pay

## 2023-09-14 DIAGNOSIS — F419 Anxiety disorder, unspecified: Secondary | ICD-10-CM

## 2023-09-14 DIAGNOSIS — E1142 Type 2 diabetes mellitus with diabetic polyneuropathy: Secondary | ICD-10-CM

## 2023-09-14 DIAGNOSIS — E785 Hyperlipidemia, unspecified: Secondary | ICD-10-CM

## 2023-09-14 NOTE — Telephone Encounter (Signed)
 Thank you :)

## 2023-09-14 NOTE — Telephone Encounter (Signed)
 FYI

## 2023-09-14 NOTE — Telephone Encounter (Signed)
 FYI Only or Action Required?: Action required by provider: request for appointment.  Patient was last seen in primary care on 11/16/2022 by Theotis Haze ORN, NP.  Called Nurse Triage reporting Leg Swelling, Dental Pain, and Body Pain.  Symptoms began unknown amount of time.  Interventions attempted: Rest, hydration, or home remedies.  Symptoms are: unchanged.  Triage Disposition: See Physician Within 24 Hours, See HCP Within 4 Hours (Or PCP Triage), See PCP When Office is Open (Within 3 Days)  Patient/caregiver understands and will follow disposition?: No, wishes to speak with PCP  Copied from CRM #[id number]. Topic: Clinical - Red Word Triage >> Sep 14, 2023  2:50 PM Winona R wrote: Swelling in legs, feet and hands. Mouth and facial pain from teeth concerns. Body Aches and cramps. Pt needs an appointment for med refill. Reason for Disposition  [1] MODERATE pain (e.g., interferes with normal activities) AND [2] present > 3 days  [1] SEVERE mouth pain (e.g., excruciating) AND [2] not improved after 2 hours of pain medicine  [1] MODERATE leg swelling (e.g., swelling extends up to knees) AND [2] new-onset or getting worse  Answer Assessment - Initial Assessment Questions 1. ONSET: When did the swelling start? (e.g., minutes, hours, days)     Started about a month ago 2. LOCATION: What part of the leg is swollen?  Are both legs swollen or just one leg?     Bilateral legs-right leg is worse than left 3. SEVERITY: How bad is the swelling? (e.g., localized; mild, moderate, severe)     moderate 4. REDNESS: Is there redness or signs of infection?     no 5. PAIN: Is the swelling painful to touch? If Yes, ask: How painful is it?   (Scale 1-10; mild, moderate or severe)     Swelling is painful at times to the touch-8-9 out of 10 6. FEVER: Do you have a fever? If Yes, ask: What is it, how was it measured, and when did it start?      no 7. CAUSE: What do you think is causing  the leg swelling?     unsure 8. MEDICAL HISTORY: Do you have a history of blood clots (e.g., DVT), cancer, heart failure, kidney disease, or liver failure?     no 9. RECURRENT SYMPTOM: Have you had leg swelling before? If Yes, ask: When was the last time? What happened that time?     Off and on 10. OTHER SYMPTOMS: Do you have any other symptoms? (e.g., chest pain, difficulty breathing)       Hand swelling  Answer Assessment - Initial Assessment Questions 1. ONSET: When did your mouth start hurting? (e.g., hours or days ago)      Patient reports right side of mouth has been hurting for several weeks 2. SEVERITY: How bad is the pain? (Scale 1-10; or mild, moderate or severe)     10 3. SORES: Are there any sores or ulcers in the mouth? If Yes, ask: What part of the mouth are the sores in?     no 4. FEVER: Do you have a fever? If Yes, ask: What is your temperature, how was it measured, and when did it start?     no 5. CAUSE: What do you think is causing the mouth pain?     unsure 6. OTHER SYMPTOMS: Do you have any other symptoms? (e.g., difficulty breathing)     no  Answer Assessment - Initial Assessment Questions 1. ONSET: When did the muscle aches or body  pains start?      Patient reports she has been having body pains for unknown amount of time 2. LOCATION: What part of your body is hurting? (e.g., entire body, arms, legs)      Entire body 3. SEVERITY: How bad is the pain? (Scale 1-10; or mild, moderate, severe)     6-7 out of 10 4. CAUSE: What do you think is causing the pains?     unsure 5. FEVER: Do you have a fever? If Yes, ask: What is your temperature, how was it measured, and  when did it start?      no 6. OTHER SYMPTOMS: Do you have any other symptoms? (e.g., chest pain, cold or flu symptoms, rash, weakness, weight loss)     no 8. TRAVEL: Have you traveled out of the country in the last month? (e.g., exposures, travel history)      no  Protocols used: Leg Swelling and Edema-A-AH, Mouth Pain-A-AH, Muscle Aches and Body Pain-A-AH

## 2023-09-14 NOTE — Telephone Encounter (Signed)
 Needs office visit. She also needs to follow up with a dentist.

## 2023-09-15 ENCOUNTER — Other Ambulatory Visit: Payer: Self-pay

## 2023-09-15 MED ORDER — GABAPENTIN 600 MG PO TABS
600.0000 mg | ORAL_TABLET | Freq: Three times a day (TID) | ORAL | 0 refills | Status: DC
  Filled 2023-09-15: qty 90, 30d supply, fill #0

## 2023-09-15 NOTE — Telephone Encounter (Signed)
 Requested medication (s) are due for refill today:   Yes for both  Requested medication (s) are on the active medication list:   Yes for both  Future visit scheduled:   Yes 09/25/2023 with Dr. Vicci   Last ordered: Celexa  07/18/2023 #30, 0 refills same for Atorvastatin .    Did is a note that pt needs an appt for refills.  04/10/2023 was a No Show.    Provider to review for refills prior to upcoming appt.   Requested Prescriptions  Pending Prescriptions Disp Refills   citalopram  (CELEXA ) 20 MG tablet 30 tablet 0    Sig: Take 1 tablet (20 mg total) by mouth daily. Must have office visit for refills     Psychiatry:  Antidepressants - SSRI Passed - 09/15/2023 11:56 AM      Passed - Valid encounter within last 6 months    Recent Outpatient Visits           10 months ago Dental infection   [facility] [place]. [facility] [place], [place] W, NP   1 year ago Type 2 diabetes mellitus with diabetic peripheral angiopathy without gangrene, without long-term current use of insulin  [facility])   [facility] [place]. [facility] Theotis Haze ORN, NP   1 year ago Primary hypertension   [facility] [place]. [facility] Theotis Haze ORN, NP   1 year ago Acute non-recurrent maxillary sinusitis   [facility] [place]. [facility] [place] Clam, MD   1 year ago Type 2 diabetes mellitus with diabetic peripheral angiopathy without gangrene, without long-term current use of insulin  [facility])   [facility] . [facility], [place] W, NP               atorvastatin  (LIPITOR) 20 MG tablet 30 tablet 0    Sig: Take 1 tablet (20 mg total) by mouth daily. Must have office visit for refills     Cardiovascular:  Antilipid - Statins Failed - 09/15/2023 11:56 AM      Failed - Lipid Panel in normal  range within the last 12 months    Cholesterol, Total  Date Value Ref Range Status  02/10/2021 134 100 - 199 mg/dL Final   Cholesterol  Date Value Ref Range Status  08/11/2023 108 <200 mg/dL Final   LDL Cholesterol (Calc)  Date Value Ref Range Status  08/11/2023 29 mg/dL (calc) Final    Comment:    Reference range: <100 . Desirable range <100 mg/dL for primary prevention;   <70 mg/dL for patients with CHD or diabetic patients  with > or = 2 CHD risk factors. SABRA LDL-C is now calculated using the Martin-Hopkins  calculation, which is a validated novel method providing  better accuracy than the Friedewald equation in the  estimation of LDL-C.  Gladis APPLETHWAITE et al. SANDREA. 7986;689(80): 2061-2068  (http://education.[employer].com/faq/FAQ164)    HDL  Date Value Ref Range Status  08/11/2023 64 > OR = 50 mg/dL Final  97/91/7976 65 >60 mg/dL Final   Triglycerides  Date Value Ref Range Status  08/11/2023 73 <150 mg/dL Final         Passed - Patient is not pregnant      Passed - Valid encounter within last 12 months  Recent Outpatient Visits           10 months ago Dental infection   [facility] [place]. [facility] [place], [place] W, NP   1 year ago Type 2 diabetes mellitus with diabetic peripheral angiopathy without gangrene, without long-term current use of insulin  [facility])   [facility] [place]. [facility] Theotis Haze ORN, NP   1 year ago Primary hypertension   [facility] [facility] [place]. [facility] Theotis Haze ORN, NP   1 year ago Acute non-recurrent maxillary sinusitis   [facility] [place] [facility] [place]. [facility] [place] Clam, MD   1 year ago Type 2 diabetes mellitus with diabetic peripheral angiopathy without gangrene, without long-term current use of insulin  [facility])   [facility]  [place]. [facility] Theotis Haze ORN, [place]

## 2023-09-15 NOTE — Telephone Encounter (Signed)
 Requested Prescriptions  Pending Prescriptions Disp Refills   gabapentin  (NEURONTIN ) 600 MG tablet 90 tablet 0    Sig: Take 1 tablet (600 mg total) by mouth 3 (three) times daily.     Neurology: Anticonvulsants - gabapentin  Failed - 09/15/2023 11:53 AM      Failed - Completed PHQ-2 or PHQ-9 in the last 360 days      Passed - Cr in normal range and within 360 days    Creat  Date Value Ref Range Status  08/11/2023 0.55 0.50 - 1.03 mg/dL Final   Creatinine, Urine  Date Value Ref Range Status  08/11/2023 72 20 - 275 mg/dL Final         Passed - Valid encounter within last 12 months    Recent Outpatient Visits           10 months ago Dental infection   [facility] [place]. [facility] [place], [place] W, NP   1 year ago Type 2 diabetes mellitus with diabetic peripheral angiopathy without gangrene, without long-term current use of insulin  [facility])   [facility] [place]. [facility] Theotis Haze ORN, NP   1 year ago Primary hypertension   [facility] [place]. [facility] Theotis Haze ORN, NP   1 year ago Acute non-recurrent maxillary sinusitis   [facility] [place]. [facility], MD   1 year ago Type 2 diabetes mellitus with diabetic peripheral angiopathy without gangrene, without long-term current use of insulin  [facility])    [facility] [place]. [facility] Theotis Haze ORN, [place]

## 2023-09-18 ENCOUNTER — Encounter (HOSPITAL_COMMUNITY): Payer: Self-pay

## 2023-09-18 ENCOUNTER — Other Ambulatory Visit: Payer: Self-pay

## 2023-09-21 ENCOUNTER — Other Ambulatory Visit: Payer: Self-pay

## 2023-09-22 ENCOUNTER — Telehealth: Payer: Self-pay | Admitting: Nurse Practitioner

## 2023-09-22 NOTE — Telephone Encounter (Signed)
 Lvm to confirm appt for 9/22

## 2023-09-25 ENCOUNTER — Other Ambulatory Visit: Payer: Self-pay

## 2023-09-25 ENCOUNTER — Encounter: Payer: Self-pay | Admitting: Internal Medicine

## 2023-09-25 ENCOUNTER — Ambulatory Visit: Attending: Internal Medicine | Admitting: Internal Medicine

## 2023-09-25 DIAGNOSIS — E1142 Type 2 diabetes mellitus with diabetic polyneuropathy: Secondary | ICD-10-CM

## 2023-09-25 DIAGNOSIS — Z8249 Family history of ischemic heart disease and other diseases of the circulatory system: Secondary | ICD-10-CM

## 2023-09-25 DIAGNOSIS — F411 Generalized anxiety disorder: Secondary | ICD-10-CM

## 2023-09-25 DIAGNOSIS — Z23 Encounter for immunization: Secondary | ICD-10-CM | POA: Diagnosis not present

## 2023-09-25 DIAGNOSIS — R198 Other specified symptoms and signs involving the digestive system and abdomen: Secondary | ICD-10-CM

## 2023-09-25 DIAGNOSIS — Z8719 Personal history of other diseases of the digestive system: Secondary | ICD-10-CM

## 2023-09-25 DIAGNOSIS — F331 Major depressive disorder, recurrent, moderate: Secondary | ICD-10-CM

## 2023-09-25 DIAGNOSIS — F17290 Nicotine dependence, other tobacco product, uncomplicated: Secondary | ICD-10-CM

## 2023-09-25 DIAGNOSIS — Z79899 Other long term (current) drug therapy: Secondary | ICD-10-CM

## 2023-09-25 DIAGNOSIS — Z7984 Long term (current) use of oral hypoglycemic drugs: Secondary | ICD-10-CM

## 2023-09-25 DIAGNOSIS — Z833 Family history of diabetes mellitus: Secondary | ICD-10-CM

## 2023-09-25 MED ORDER — ATORVASTATIN CALCIUM 20 MG PO TABS
20.0000 mg | ORAL_TABLET | Freq: Every day | ORAL | 1 refills | Status: DC
  Filled 2023-09-25: qty 90, 90d supply, fill #0
  Filled 2023-12-14: qty 90, 90d supply, fill #1

## 2023-09-25 MED ORDER — HYDROXYZINE PAMOATE 25 MG PO CAPS
25.0000 mg | ORAL_CAPSULE | Freq: Two times a day (BID) | ORAL | 1 refills | Status: AC | PRN
  Filled 2023-09-25: qty 30, 15d supply, fill #0

## 2023-09-25 MED ORDER — DULOXETINE HCL 30 MG PO CPEP
30.0000 mg | ORAL_CAPSULE | Freq: Every day | ORAL | 3 refills | Status: DC
  Filled 2023-09-25: qty 30, 30d supply, fill #0

## 2023-09-25 NOTE — Progress Notes (Signed)
 Patient ID: Denise Hale, female    DOB: 1970-09-11  MRN: [id number]  CC: Leg Pain (Increased neuropathy bilateral leg pain - R leg is worse - intermittent swelling/Requesting rx for dentist visit due to panic attacks - financial concerns/Increased anxiety/Flu vax administered on 09/25/23 - C.A.)   Subjective: Denise Hale is a 53 y.o. female who presents for UC visit.  PCP is NP Theotis whom she last saw 11/2022 Her concerns today include:  Pt with hx of DM, GERD, HL, MDD/GAD, HTN  Discussed the use of AI scribe software for clinical note transcription with the patient, who gave verbal consent to proceed.  History of Present Illness   Denise Hale is a 53 year old female with diabetes who presents with increased neuropathy pain in her legs.  She experiences increased neuropathy pain in her legs, with the right leg being more affected than the left. The pain is persistent and not relieved by her current medication, gabapentin  600 mg, which she takes three times a day. She also experiences numbness and tingling in her legs almost constantly, with some relief during sleep. 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  MDD/GAD: She has a history of depression and anxiety, currently managed with citalopram , which she feels is no longer effective. She describes herself as overwhelmed, with frequent crying and stress without relief. She has not seen a therapist or psychiatrist recently but has in the past. No thoughts of self-harm are present.  She is also concerned about her dental health, mentioning periodontal disease and the need for extractions due to broken teeth. However, she gets very anxious and emotional when she goes to see dentist. Requesting Xanax to use when she sees dentist. No current appt scheduled  Needs RF on Lipitor.  At the end of this visit, pt reported gastrointestinal issues related to her history of diverticulitis,  including episodes of gas, irregular bowel movements, and abdominal pain. Recently, she experienced a severe episode of diarrhea, leading to prolonged sitting on the toilet and resulting in a sore on her tailbone.   Patient Active Problem List   Diagnosis Date Noted   Constipation 08/11/2023   Gastroesophageal reflux disease without esophagitis 08/11/2023   Trigger finger, left middle finger 05/18/2021   Type 2 diabetes mellitus with diabetic peripheral angiopathy without gangrene, without long-term current use of insulin  (HCC) 05/12/2021   Vitreomacular adhesion of both eyes 11/04/2020   Severe nonproliferative diabetic retinopathy of both eyes (HCC) 11/04/2020   Nuclear sclerotic cataract of both eyes 11/04/2020   Carpal tunnel syndrome on right 04/07/2020   DM2 (diabetes mellitus, type 2) (HCC) 10/30/2019   Influenza vaccine needed 10/30/2019     Current Outpatient Medications on File Prior to Visit  Medication Sig Dispense Refill   Blood Glucose Monitoring Suppl (ONETOUCH VERIO REFLECT) w/Device KIT Check blood glucose level by fingerstick twice per day. Dx: E11.65 1 kit 0   chlorhexidine  (PERIDEX ) 0.12 % solution Use as directed 15 mLs in the mouth or throat 2 (two) times daily. 473 mL 1   citalopram  (CELEXA ) 20 MG tablet Take 1 tablet (20 mg total) by mouth daily. Must have office visit for refills 30 tablet 0   Continuous Glucose Receiver (FREESTYLE LIBRE 3 READER) DEVI Use to check blood sugar continuously throughout the day. 1 each 0   Continuous Glucose Sensor (FREESTYLE LIBRE 3 PLUS SENSOR) MISC Change sensor every 15 days. 6 each  3   Docusate Sodium (DSS) 100 MG CAPS Take by mouth.     DULoxetine  (CYMBALTA ) 30 MG capsule Take 1 capsule every day by oral route.     empagliflozin  (JARDIANCE ) 25 MG TABS tablet Take 1 tablet (25 mg total) by mouth daily before breakfast. 90 tablet 3   gabapentin  (NEURONTIN ) 600 MG tablet Take 1 tablet (600 mg total) by mouth 3 (three) times daily.  90 tablet 0   glipiZIDE  (GLUCOTROL ) 10 MG tablet Take 1 tablet (10 mg total) by mouth 2 (two) times daily before a meal. 180 tablet 3   insulin  aspart (NOVOLOG  FLEXPEN) 100 UNIT/ML FlexPen inject a max of 30 unitd daily as directed 30 mL 2   insulin  glargine (LANTUS  SOLOSTAR) 100 UNIT/ML Solostar Pen Inject 40 Units into the skin daily. 45 mL 3   insulin  lispro (HUMALOG  KWIKPEN) 100 UNIT/ML KwikPen inject a max of 30 units daily as directed 30 mL 2   Insulin  Pen Needle 32G X 4 MM MISC Use in the morning, at noon, in the evening, and at bedtime. 400 each 3   linaclotide  (LINZESS ) 72 MCG capsule Take 1 capsule (72 mcg total) by mouth daily before breakfast. For constipation 90 capsule 2   lisinopril  (ZESTRIL ) 5 MG tablet Take 1 tablet (5 mg total) by mouth daily. 90 tablet 3   Na Sulfate-K Sulfate-Mg Sulf 17.5-3.13-1.6 GM/177ML SOLN See admin instructions.     nystatin -triamcinolone  ointment (MYCOLOG) APPLY THIN FILM TO EXTERNAL VAGINAL AREA DAILY X 5 DAYS. DO NOT INSERT INTO VAGINA 30 g 0   ondansetron  (ZOFRAN -ODT) 4 MG disintegrating tablet Take 1 tablet (4 mg total) by mouth every 8 (eight) hours as needed for nausea or vomiting. 60 tablet 1   pantoprazole  (PROTONIX ) 40 MG tablet Take 1 tablet every day by oral route.     tiZANidine  (ZANAFLEX ) 4 MG tablet Take 1 tablet (4 mg total) by mouth every 6 (six) hours as needed for muscle spasms. 90 tablet 3   TRUEplus Lancets 28G MISC Use as instructed. Check blood glucose level by fingerstick twice per day.   E11.65 100 each 3   lansoprazole  (PREVACID ) 30 MG capsule Take 1 capsule (30 mg total) by mouth daily at 12 noon. FOR ACID REFLUX 90 capsule 0   No current facility-administered medications on file prior to visit.    Allergies  Allergen Reactions   Ciprofloxacin    Metformin And Related Diarrhea    Social History   Socioeconomic History   Marital status: Single    Spouse name: Not on file   Number of children: 2   Years of education:  Not on file   Highest education level: Some college, no degree  Occupational History   Occupation: bartender  Tobacco Use   Smoking status: Some Days    Types: Cigars   Smokeless tobacco: Never  Vaping Use   Vaping status: Never Used  Substance and Sexual Activity   Alcohol use: Not Currently    Comment: occasionally   Drug use: Yes    Types: Marijuana   Sexual activity: Yes    Birth control/protection: None  Other Topics Concern   Not on file  Social History Narrative   Not on file   Social Drivers of Health   Financial Resource Strain: High Risk (09/24/2023)   Overall Financial Resource Strain (CARDIA)    Difficulty of Paying Living Expenses: Hard  Food Insecurity: Food Insecurity Present (09/24/2023)   Hunger Vital Sign    Worried  About Running Out of Food in the Last Year: Sometimes true    Ran Out of Food in the Last Year: Sometimes true  Transportation Needs: Unmet Transportation Needs (09/24/2023)   PRAPARE - Transportation    Lack of Transportation (Medical): Yes    Lack of Transportation (Non-Medical): Yes  Physical Activity: Sufficiently Active (09/24/2023)   Exercise Vital Sign    Days of Exercise per Week: 5 days    Minutes of Exercise per Session: 90 min  Stress: Stress Concern Present (09/24/2023)   [employer] of Occupational Health - Occupational Stress Questionnaire    Feeling of Stress: To some extent  Social Connections: Moderately Isolated (09/24/2023)   Social Connection and Isolation Panel    Frequency of Communication with Friends and Family: More than three times a week    Frequency of Social Gatherings with Friends and Family: Patient declined    Attends Religious Services: 1 to 4 times per year    Active Member of [employer] or Organizations: No    Attends Engineer, structural: Not on file    Marital Status: Divorced  Catering manager Violence: Not on file    Family History  Problem Relation Age of Onset   Diabetes Mother     Thyroid disease Mother    Heart disease Father    Breast cancer Paternal Aunt    Breast cancer Paternal Aunt     Past Surgical History:  Procedure Laterality Date   CARPAL TUNNEL RELEASE Right 04/08/2020   Procedure: RIGHT CARPAL TUNNEL RELEASE;  Surgeon: Jerri Kay HERO, MD;  Location: [facility];  Service: Orthopedics;  Laterality: Right;   CHOLECYSTECTOMY     COLON SURGERY      ROS: Review of Systems Negative except as stated above  PHYSICAL EXAM: BP 121/74 (BP Location: Left Arm, Patient Position: Sitting, Cuff Size: Normal)   Pulse 71   Temp 98.5 F (36.9 C) (Oral)   Ht 5' 6 (1.676 m)   Wt 133 lb (60.3 kg)   SpO2 99%   BMI 21.47 kg/m   Physical Exam  General appearance - alert, well appearing, middle age to older AAF and in no distress Mental status - pt very tearful Skin - CMA Clarisa present: no active ulcer/bedsore seen on buttock. No external hemorrhoids seen      Latest Ref Rng & Units 08/11/2023   10:25 AM 01/27/2023    7:05 PM 01/27/2023    6:32 PM  CMP  Glucose 65 - 99 mg/dL 855   553   BUN 7 - 25 mg/dL 14   10   Creatinine 9.49 - 1.03 mg/dL 9.44   9.26   Sodium 864 - 146 mmol/L 139  136  137   Potassium 3.5 - 5.3 mmol/L 4.2  3.8  3.5   Chloride 98 - 110 mmol/L 104   96   CO2 20 - 32 mmol/L 31   28   Calcium  8.6 - 10.4 mg/dL 9.5   9.2   Total Protein 6.5 - 8.1 g/dL   7.3   Total Bilirubin 0.0 - 1.2 mg/dL   0.7   Alkaline Phos 38 - 126 U/L   98   AST 15 - 41 U/L   30   ALT 0 - 44 U/L   62    Lipid Panel     Component Value Date/Time   CHOL 108 08/11/2023 1025   CHOL 134 02/10/2021 1207   TRIG 73 08/11/2023 1025   HDL  64 08/11/2023 1025   HDL 65 02/10/2021 1207   CHOLHDL 1.7 08/11/2023 1025   LDLCALC 29 08/11/2023 1025    CBC    Component Value Date/Time   WBC 11.3 (H) 01/27/2023 1832   RBC 3.98 01/27/2023 1832   HGB 12.9 01/27/2023 1905   HGB 12.8 01/05/2022 1210   HCT 38.0 01/27/2023 1905   HCT 38.6 01/05/2022 1210   PLT  387 01/27/2023 1832   PLT 332 01/05/2022 1210   MCV 89.9 01/27/2023 1832   MCV 89 01/05/2022 1210   MCH 29.9 01/27/2023 1832   MCHC 33.2 01/27/2023 1832   RDW 13.3 01/27/2023 1832   RDW 12.6 01/05/2022 1210   LYMPHSABS 2.0 01/27/2023 1832   LYMPHSABS 3.5 (H) 01/05/2022 1210   MONOABS 0.7 01/27/2023 1832   EOSABS 0.0 01/27/2023 1832   EOSABS 0.3 01/05/2022 1210   BASOSABS 0.0 01/27/2023 1832   BASOSABS 0.0 01/05/2022 1210    ASSESSMENT AND PLAN: 1. Diabetic peripheral neuropathy (HCC) We discuss changing Celexa  to Cymbalta  to augment/help the Gabapentin . Pt will to try this. Advise to decrease Celexa  20 mg to 1/2 tab daily x 3 wks then every other day for 2-3 wks then stop. Written instructions provided as well on discgh summary. Once Celexa  is stopped, she is to start Cymbalta  - atorvastatin  (LIPITOR) 20 MG tablet; Take 1 tablet (20 mg total) by mouth daily. Must have office visit for refills  Dispense: 90 tablet; Refill: 1  2. GAD (generalized anxiety disorder) Discussed and advised [facility] referral for med management and counseling. Pt agreeable -pt agreeable to changing Celexa  to Cymbalta  as discussed above -[place] Hydroxyzine  to use PRN. Advised it can cause drowsiness - Ambulatory referral to Psychiatry - hydrOXYzine  (VISTARIL ) 25 MG capsule; Take 1 capsule (25 mg total) by mouth 2 (two) times daily as needed.  Dispense: 30 capsule; Refill: 1 - DULoxetine  (CYMBALTA ) 30 MG capsule; Take 1 capsule (30 mg total) by mouth daily. Start after tapering off Celexa   Dispense: 30 capsule; Refill: 3  3. Major depressive disorder, recurrent episode, moderate (HCC) See #2 above - Ambulatory referral to Psychiatry - DULoxetine  (CYMBALTA ) 30 MG capsule; Take 1 capsule (30 mg total) by mouth daily. Start after tapering off Celexa   Dispense: 30 capsule; Refill: 3  4. Gastrointestinal symptoms Will f/u with her PCP to address in the interest of time  5. Need for immunization against influenza -  Flu vaccine trivalent PF, 6mos and older(Flulaval,Afluria,Fluarix,Fluzone)   Patient was given the opportunity to ask questions.  Patient verbalized understanding of the plan and was able to repeat key elements of the plan.   This documentation was completed using Paediatric nurse.  Any transcriptional errors are unintentional.  Orders Placed This Encounter  Procedures   Flu vaccine trivalent PF, 6mos and older(Flulaval,Afluria,Fluarix,Fluzone)   Ambulatory referral to Psychiatry     Requested Prescriptions   Signed Prescriptions Disp Refills   atorvastatin  (LIPITOR) 20 MG tablet 90 tablet 1    Sig: Take 1 tablet (20 mg total) by mouth daily. Must have office visit for refills   hydrOXYzine  (VISTARIL ) 25 MG capsule 30 capsule 1    Sig: Take 1 capsule (25 mg total) by mouth 2 (two) times daily as needed.   DULoxetine  (CYMBALTA ) 30 MG capsule 30 capsule 3    Sig: Take 1 capsule (30 mg total) by mouth daily. Start after tapering off Celexa     Return in about 3 weeks (around 10/16/2023) for Give f/u  with  PCP Haze Servant.  Barnie Louder, MD, FACP

## 2023-09-25 NOTE — Patient Instructions (Signed)
  VISIT SUMMARY: Today, we discussed your increased neuropathy pain, depression and anxiety, gastrointestinal issues, dental health, and other chronic conditions. We have made some changes to your medications and provided referrals to specialists to help manage your symptoms more effectively.  CELEXA  20 mg wean schedule: 1/2 tablet daily x 3 weeks then 1/2 tablet every other day for 2 weeks then stop.  Start Duloxetine  (Cymbalta ) once weaned of Celexa .  YOUR PLAN: -TYPE 2 DIABETES MELLITUS WITH DIABETIC POLYNEUROPATHY: You have chronic nerve pain in your legs due to diabetes, with the right leg being more affected. We will start tapering off your current medication, Celexa , and begin Cymbalta  30 mg to help manage your pain more effectively.  -MAJOR DEPRESSIVE DISORDER AND ANXIETY DISORDER: You are experiencing ongoing depression and anxiety that are not well-managed with your current medication. We will taper off Celexa  and start you on Cymbalta  30 mg. Additionally, we will refer you to a psychiatrist for medication management and a therapist for counseling. Hydroxyzine  will be prescribed as needed for anxiety, but be cautious as it may cause drowsiness.   -DIVERTICULOSIS WITH RECURRENT SYMPTOMS: You have recurring abdominal pain, gas, and irregular bowel movements due to diverticulosis. We will schedule a follow-up with your primary care provider to address these gastrointestinal symptoms.  -PERIODONTITIS AND MULTIPLE BROKEN TEETH: You have gum disease and several broken teeth that need to be extracted. We will schedule a dental appointment for these extractions.  -HEMORRHOIDS: You have hemorrhoids, possibly worsened by prolonged sitting during a recent gastrointestinal episode. We will schedule a follow-up with your primary care provider to address this issue.  -HYPERLIPIDEMIA: You have high cholesterol that needs ongoing management. We will refill your atorvastatin  prescription to help control  your cholesterol levels.  -ESSENTIAL HYPERTENSION: You have high blood pressure that requires continuous management. We will continue your current treatment plan.  INSTRUCTIONS: 1. Wean off Celexa  and start Cymbalta  30 mg as directed. 2. Schedule appointments with a psychiatrist and therapist for mental health support. 3. Schedule a follow-up with your primary care provider for gastrointestinal symptoms and hemorrhoids. 4. Schedule a dental appointment for extractions. 5. Continue taking atorvastatin  for high cholesterol and lisinopril  for high blood pressure.                      Contains text generated by [employer]bridge.                                 Contains text generated by Abridge.

## 2023-10-16 ENCOUNTER — Ambulatory Visit: Attending: Nurse Practitioner | Admitting: Nurse Practitioner

## 2023-10-16 ENCOUNTER — Other Ambulatory Visit: Payer: Self-pay

## 2023-10-16 VITALS — BP 127/71 | HR 81 | Resp 18 | Ht 66.0 in | Wt 139.6 lb

## 2023-10-16 DIAGNOSIS — F331 Major depressive disorder, recurrent, moderate: Secondary | ICD-10-CM

## 2023-10-16 DIAGNOSIS — E1151 Type 2 diabetes mellitus with diabetic peripheral angiopathy without gangrene: Secondary | ICD-10-CM

## 2023-10-16 DIAGNOSIS — Z7984 Long term (current) use of oral hypoglycemic drugs: Secondary | ICD-10-CM | POA: Diagnosis not present

## 2023-10-16 DIAGNOSIS — M25561 Pain in right knee: Secondary | ICD-10-CM

## 2023-10-16 DIAGNOSIS — E119 Type 2 diabetes mellitus without complications: Secondary | ICD-10-CM

## 2023-10-16 DIAGNOSIS — Z1231 Encounter for screening mammogram for malignant neoplasm of breast: Secondary | ICD-10-CM

## 2023-10-16 DIAGNOSIS — E1142 Type 2 diabetes mellitus with diabetic polyneuropathy: Secondary | ICD-10-CM | POA: Diagnosis not present

## 2023-10-16 DIAGNOSIS — Z794 Long term (current) use of insulin: Secondary | ICD-10-CM | POA: Diagnosis not present

## 2023-10-16 DIAGNOSIS — F411 Generalized anxiety disorder: Secondary | ICD-10-CM

## 2023-10-16 MED ORDER — CITALOPRAM HYDROBROMIDE 40 MG PO TABS
40.0000 mg | ORAL_TABLET | Freq: Every day | ORAL | 3 refills | Status: AC
Start: 2023-10-16 — End: ?
  Filled 2023-10-16: qty 30, 30d supply, fill #0
  Filled 2023-11-17: qty 30, 30d supply, fill #1
  Filled 2023-12-14 – 2024-01-11 (×2): qty 30, 30d supply, fill #2

## 2023-10-16 MED ORDER — GABAPENTIN 600 MG PO TABS
600.0000 mg | ORAL_TABLET | Freq: Three times a day (TID) | ORAL | 3 refills | Status: AC
  Filled 2023-10-16 – 2023-11-17 (×2): qty 90, 30d supply, fill #0

## 2023-10-16 NOTE — Patient Instructions (Signed)
,  nm,i

## 2023-10-16 NOTE — Progress Notes (Signed)
 Assessment & Plan:  Denise Hale was seen today for hypertension.  Diagnoses and all orders for this visit:   Acute pain of right knee Contusion with localized pain and mild swelling, no significant fluid accumulation. - Apply voltaren gel to the affected area. - Use a compression sleeve for support and to reduce swelling. - Report if pain persists at the same level after two weeks.  Diabetes mellitus treated with insulin  and oral medication (HCC) Follow up with ENDO    Breast cancer screening by mammogram -     MS 3D SCR MAMMO BILAT BR (aka MM); Future  Type 2 diabetes mellitus with diabetic polyneuropathy, with long-term current use of insulin  (HCC) -     gabapentin  (NEURONTIN ) 600 MG tablet; Take 1 tablet (600 mg total) by mouth 3 (three) times daily. -     Ambulatory referral to Podiatry Neuropathic pain rated 7-8/10. Previous treatments included gabapentin , Cymbalta , and Lyrica . Gabapentin  currently used. Referral to neurology considered. Insurance coverage issues with [employer] noted. - Continue gabapentin  for neuropathy management. - Refer to neurology for further evaluation and management of neuropathy. - Discuss with insurance provider to ensure coverage for necessary referrals and treatments.  Major depressive disorder, recurrent episode, moderate (HCC) -     citalopram  (CELEXA ) 40 MG tablet; Take 1 tablet (40 mg total) by mouth daily. For anxiety and depression Current Celexa  dose may need adjustment due to suboptimal mood improvement. Cymbalta  considered but not used due to previous lack of efficacy for neuropathy. - Increase Celexa  dose to 40 mg by taking two 20 mg tablets until the current supply is exhausted.   GAD (generalized anxiety disorder) -     citalopram  (CELEXA ) 40 MG tablet; Take 1 tablet (40 mg total) by mouth daily. For anxiety and depression    Patient has been counseled on age-appropriate routine health concerns for screening and prevention. These  are reviewed and up-to-date. Referrals have been placed accordingly. Immunizations are up-to-date or declined.    Subjective:   Chief Complaint  Patient presents with   Hypertension    History of Present Illness Denise Hale is a 53 year old female with diabetic neuropathy who presents with right knee pain after a fall.  She has a past medical history of Anxiety, Asthma, DM2, Diverticulitis, GERD, peripheral neuropathy, HTN, and Severe nonproliferative diabetic retinopathy.      She has right knee pain following a fall two days ago, primarily located at the kneecap and exacerbated by descending stairs. She experiences a sensation of her leg giving out and notes some swelling, which has led her to limit her activity.  She has a history of diabetic neuropathy and is currently taking gabapentin . She previously used Cymbalta  and Lyrica  for neuropathy and back pain, but is no longer taking them. Her neuropathy pain is rated as 7 or 8 out of 10. Her diabetes is managed by ENDO. She uses a Libre sensor for continuous glucose monitoring, which she finds helpful.   She is experiencing dental issues related to periodontal disease, resulting in tooth loss.    Review of Systems  Constitutional:  Negative for fever, malaise/fatigue and weight loss.  HENT:  Negative for nosebleeds.        Poor dentition  Eyes: Negative.  Negative for blurred vision, double vision and photophobia.  Respiratory: Negative.  Negative for cough and shortness of breath.   Cardiovascular: Negative.  Negative for chest pain, palpitations and leg swelling.  Gastrointestinal: Negative.  Negative for heartburn, nausea  and vomiting.  Musculoskeletal:  Positive for joint pain. Negative for myalgias.  Neurological:  Positive for tingling and sensory change. Negative for dizziness, focal weakness, seizures and headaches.  Psychiatric/Behavioral: Negative.  Negative for suicidal ideas.     Past Medical History:  Diagnosis Date    Anxiety    Asthma    Diverticulitis    DM (diabetes mellitus), type 2 with ophthalmic complications (HCC)    GERD (gastroesophageal reflux disease)    Severe nonproliferative diabetic retinopathy (HCC)     Past Surgical History:  Procedure Laterality Date   CARPAL TUNNEL RELEASE Right 04/08/2020   Procedure: RIGHT CARPAL TUNNEL RELEASE;  Surgeon: Jerri Kay HERO, MD;  Location: [facility];  Service: Orthopedics;  Laterality: Right;   CHOLECYSTECTOMY     COLON SURGERY      Family History  Problem Relation Age of Onset   Diabetes Mother    Thyroid disease Mother    Heart disease Father    Breast cancer Paternal Aunt    Breast cancer Paternal Aunt     Social History Reviewed with no changes to be made today.   Outpatient Medications Prior to Visit  Medication Sig Dispense Refill   atorvastatin  (LIPITOR) 20 MG tablet Take 1 tablet (20 mg total) by mouth daily. Must have office visit for refills 90 tablet 1   Blood Glucose Monitoring Suppl (ONETOUCH VERIO REFLECT) w/Device KIT Check blood glucose level by fingerstick twice per day. Dx: E11.65 1 kit 0   chlorhexidine  (PERIDEX ) 0.12 % solution Use as directed 15 mLs in the mouth or throat 2 (two) times daily. 473 mL 1   Continuous Glucose Receiver (FREESTYLE LIBRE 3 READER) DEVI Use to check blood sugar continuously throughout the day. 1 each 0   Continuous Glucose Sensor (FREESTYLE LIBRE 3 PLUS SENSOR) MISC Change sensor every 15 days. 6 each 3   Docusate Sodium (DSS) 100 MG CAPS Take by mouth.     empagliflozin  (JARDIANCE ) 25 MG TABS tablet Take 1 tablet (25 mg total) by mouth daily before breakfast. 90 tablet 3   glipiZIDE  (GLUCOTROL ) 10 MG tablet Take 1 tablet (10 mg total) by mouth 2 (two) times daily before a meal. 180 tablet 3   hydrOXYzine  (VISTARIL ) 25 MG capsule Take 1 capsule (25 mg total) by mouth 2 (two) times daily as needed. 30 capsule 1   insulin  aspart (NOVOLOG  FLEXPEN) 100 UNIT/ML FlexPen inject a max of  30 unitd daily as directed 30 mL 2   insulin  glargine (LANTUS  SOLOSTAR) 100 UNIT/ML Solostar Pen Inject 40 Units into the skin daily. 45 mL 3   insulin  lispro (HUMALOG  KWIKPEN) 100 UNIT/ML KwikPen inject a max of 30 units daily as directed 30 mL 2   Insulin  Pen Needle 32G X 4 MM MISC Use in the morning, at noon, in the evening, and at bedtime. 400 each 3   lansoprazole  (PREVACID ) 30 MG capsule Take 1 capsule (30 mg total) by mouth daily at 12 noon. FOR ACID REFLUX 90 capsule 0   linaclotide  (LINZESS ) 72 MCG capsule Take 1 capsule (72 mcg total) by mouth daily before breakfast. For constipation 90 capsule 2   lisinopril  (ZESTRIL ) 5 MG tablet Take 1 tablet (5 mg total) by mouth daily. 90 tablet 3   Na Sulfate-K Sulfate-Mg Sulf 17.5-3.13-1.6 GM/177ML SOLN See admin instructions.     nystatin -triamcinolone  ointment (MYCOLOG) APPLY THIN FILM TO EXTERNAL VAGINAL AREA DAILY X 5 DAYS. DO NOT INSERT INTO VAGINA 30 g 0  ondansetron  (ZOFRAN -ODT) 4 MG disintegrating tablet Take 1 tablet (4 mg total) by mouth every 8 (eight) hours as needed for nausea or vomiting. 60 tablet 1   pantoprazole  (PROTONIX ) 40 MG tablet Take 1 tablet every day by oral route.     tiZANidine  (ZANAFLEX ) 4 MG tablet Take 1 tablet (4 mg total) by mouth every 6 (six) hours as needed for muscle spasms. 90 tablet 3   TRUEplus Lancets 28G MISC Use as instructed. Check blood glucose level by fingerstick twice per day.   E11.65 100 each 3   citalopram  (CELEXA ) 20 MG tablet Take 1 tablet (20 mg total) by mouth daily. Must have office visit for refills 30 tablet 0   gabapentin  (NEURONTIN ) 600 MG tablet Take 1 tablet (600 mg total) by mouth 3 (three) times daily. 90 tablet 0   DULoxetine  (CYMBALTA ) 30 MG capsule Take 1 capsule every day by oral route.     DULoxetine  (CYMBALTA ) 30 MG capsule Take 1 capsule (30 mg total) by mouth daily. Start after tapering off Celexa  (Patient not taking: Reported on 10/16/2023) 30 capsule 3   No  facility-administered medications prior to visit.    Allergies  Allergen Reactions   Ciprofloxacin    Metformin And Related Diarrhea       Objective:    BP 127/71 (BP Location: Left Arm, Patient Position: Sitting, Cuff Size: Small)   Pulse 81   Resp 18   Ht 5' 6 (1.676 m)   Wt 139 lb 9.6 oz (63.3 kg)   SpO2 98%   BMI 22.53 kg/m  Wt Readings from Last 3 Encounters:  10/16/23 139 lb 9.6 oz (63.3 kg)  09/25/23 133 lb (60.3 kg)  08/11/23 135 lb (61.2 kg)    Physical Exam Vitals and nursing note reviewed.  Constitutional:      Appearance: She is well-developed.  HENT:     Head: Normocephalic and atraumatic.     Mouth/Throat:     Dentition: Abnormal dentition. Dental caries present.  Cardiovascular:     Rate and Rhythm: Normal rate and regular rhythm.     Heart sounds: Normal heart sounds. No murmur heard.    No friction rub. No gallop.  Pulmonary:     Effort: Pulmonary effort is normal. No tachypnea or respiratory distress.     Breath sounds: Normal breath sounds. No decreased breath sounds, wheezing, rhonchi or rales.  Chest:     Chest wall: No tenderness.  Abdominal:     General: Bowel sounds are normal.     Palpations: Abdomen is soft.  Musculoskeletal:        General: Normal range of motion.     Cervical back: Normal range of motion.  Skin:    General: Skin is warm and dry.  Neurological:     Mental Status: She is alert and oriented to person, place, and time.     Coordination: Coordination normal.  Psychiatric:        Behavior: Behavior normal. Behavior is cooperative.        Thought Content: Thought content normal.        Judgment: Judgment normal.          Patient has been counseled extensively about nutrition and exercise as well as the importance of adherence with medications and regular follow-up. The patient was given clear instructions to go to ER or return to medical center if symptoms don't improve, worsen or new problems develop. The patient  verbalized understanding.   Follow-up: Return in  about 3 months (around 01/19/2024).   Haze LELON Servant, FNP-BC [facility] [place], [place] [phone]   10/27/2023, 1:19 PM

## 2023-11-17 ENCOUNTER — Ambulatory Visit: Admitting: Internal Medicine

## 2023-11-17 ENCOUNTER — Encounter: Payer: Self-pay | Admitting: Internal Medicine

## 2023-11-17 ENCOUNTER — Other Ambulatory Visit: Payer: Self-pay

## 2023-11-17 VITALS — BP 138/78 | Ht 66.0 in | Wt 133.0 lb

## 2023-11-17 DIAGNOSIS — I10 Essential (primary) hypertension: Secondary | ICD-10-CM | POA: Insufficient documentation

## 2023-11-17 DIAGNOSIS — Z780 Asymptomatic menopausal state: Secondary | ICD-10-CM

## 2023-11-17 DIAGNOSIS — E1142 Type 2 diabetes mellitus with diabetic polyneuropathy: Secondary | ICD-10-CM

## 2023-11-17 DIAGNOSIS — E1165 Type 2 diabetes mellitus with hyperglycemia: Secondary | ICD-10-CM

## 2023-11-17 DIAGNOSIS — Z794 Long term (current) use of insulin: Secondary | ICD-10-CM

## 2023-11-17 DIAGNOSIS — G629 Polyneuropathy, unspecified: Secondary | ICD-10-CM | POA: Insufficient documentation

## 2023-11-17 DIAGNOSIS — Z87891 Personal history of nicotine dependence: Secondary | ICD-10-CM | POA: Insufficient documentation

## 2023-11-17 DIAGNOSIS — E113499 Type 2 diabetes mellitus with severe nonproliferative diabetic retinopathy without macular edema, unspecified eye: Secondary | ICD-10-CM

## 2023-11-17 DIAGNOSIS — E782 Mixed hyperlipidemia: Secondary | ICD-10-CM | POA: Insufficient documentation

## 2023-11-17 LAB — POCT GLYCOSYLATED HEMOGLOBIN (HGB A1C): Hemoglobin A1C: 9.2 % — AB (ref 4.0–5.6)

## 2023-11-17 MED ORDER — INSULIN PEN NEEDLE 32G X 4 MM MISC
1.0000 | Freq: Every day | 3 refills | Status: AC
  Filled 2023-11-17: qty 100, 30d supply, fill #0

## 2023-11-17 MED ORDER — LANTUS SOLOSTAR 100 UNIT/ML ~~LOC~~ SOPN
36.0000 [IU] | PEN_INJECTOR | Freq: Every day | SUBCUTANEOUS | 3 refills | Status: AC
  Filled 2023-11-17: qty 45, 125d supply, fill #0
  Filled 2023-12-14: qty 33, 90d supply, fill #0

## 2023-11-17 MED ORDER — GLIPIZIDE ER 10 MG PO TB24
10.0000 mg | ORAL_TABLET | Freq: Every day | ORAL | 3 refills | Status: AC
Start: 2023-11-17 — End: ?
  Filled 2023-11-17: qty 90, 90d supply, fill #0

## 2023-11-17 NOTE — Progress Notes (Signed)
 Name: Denise Hale  MRN/ DOB: [id number], 05-27-1970   Age/ Sex: 53 y.o., female    PCP: Theotis Haze ORN, NP   Reason for Endocrinology Evaluation: Type 2 Diabetes Mellitus     Date of Initial Endocrinology Visit: 11/10/2022    PATIENT IDENTIFIER: Denise Hale is a 53 y.o. female with a past medical history of DM, . The patient presented for initial endocrinology clinic visit on 11/10/2022 for consultative assistance with her diabetes management.    HPI: [name] was    Diagnosed with DM 16 yrs ago  Prior Medications tried/Intolerance: Metformin-diarrhea, tradjenta - no intolerance. Trulicity  - extreme weight loss and GI issues                   Hemoglobin A1c has ranged from 6.6% in 2023, peaking at 12.3% in 2024.   On her initial visit to our clinic her A1c was 11.7%, she was on glipizide  XL 10 mg and Humalog  mix.  I stopped glipizide  and started her on Jardiance .  I also switched insulin  mix to basal/prandial.  A prescription for freestyle libre 3 was sent to the pharmacy at the time.   I did try her on glipizide  while holding prandial NovoLog  in August, 2025 with an A1c of 9.8%, upon her return her A1c has trended down to 9.2% without prandial insulin .  She was doing better with taking glipizide  and the NovoLog  and we switched glipizide  to XL.   SUBJECTIVE:   During the last visit (08/11/2023): A1c 9.8%    Today (11/17/23): Denise Hale is here for follow-up on diabetes management.  She checks glucose multiple daily The patient has had hypoglycemic episodes since the last clinic visit.   The patient was evaluated by podiatry 11/14/2023 She had her tooth pulled 2 days ago and has been having low oral intake  Mild nausea  No constipation but has occasional  diarrhea   She does have difficulty taking Glipizide  in the evening  She has hot flashes and hair loss  No Gynecology  No personal hx of breast ca ,  paternal  aunt with breast ca, Paternal VTE Has hx of migraine     HOME DIABETES REGIMEN: Glipizide  10 mg twice daily Jardiance  25 mg daily Lantus  40 units daily   Statin: yes ACE-I/ARB: Yes   METER DOWNLOAD SUMMARY:    CONTINUOUS GLUCOSE MONITORING RECORD INTERPRETATION    Dates of Recording: 10/31-11/13/2025  Sensor description: Freestyle libre  Results statistics:   CGM use % of time 97  Average and SD 216/32.8  Time in range 34%  % Time Above 180 40  % Time above 250 26  % Time Below target 0   Glycemic patterns summary: BGs trend down overnight and increased throughout the day  Hyperglycemic episodes postprandial  Hypoglycemic episodes occurred in the morning  Overnight periods: Trends down    DIABETIC COMPLICATIONS: Microvascular complications:  Neuropathy, DR  Denies: CKD Last eye exam: Completed 11/2020  Macrovascular complications:   Denies: CAD, PVD, CVA   PAST HISTORY: Past Medical History:  Past Medical History:  Diagnosis Date   Anxiety    Asthma    Diverticulitis    DM (diabetes mellitus), type 2 with ophthalmic complications (HCC)    Essential hypertension 11/17/2023   GERD (gastroesophageal reflux disease)    Mixed hyperlipidemia 11/17/2023   Severe nonproliferative diabetic retinopathy (HCC)    Past Surgical History:  Past Surgical History:  Procedure Laterality Date   CARPAL TUNNEL RELEASE Right  04/08/2020   Procedure: RIGHT CARPAL TUNNEL RELEASE;  Surgeon: Jerri Kay HERO, MD;  Location: [facility];  Service: Orthopedics;  Laterality: Right;   CHOLECYSTECTOMY     COLON SURGERY      Social History:  reports that she has been smoking cigars. She has never used smokeless tobacco. She reports that she does not currently use alcohol. She reports current drug use. Drug: Marijuana. Family History:  Family History  Problem Relation Age of Onset   Diabetes Mother    Thyroid disease Mother    Heart disease Father    Breast cancer Paternal Aunt    Breast cancer Paternal Aunt       HOME MEDICATIONS: Allergies as of 11/17/2023       Reactions   Ciprofloxacin    Metformin And Related Diarrhea        Medication List        Accurate as of November 17, 2023  9:27 AM. If you have any questions, ask your nurse or doctor.          atorvastatin  20 MG tablet Commonly known as: LIPITOR Take 1 tablet (20 mg total) by mouth daily. Must have office visit for refills   BD Pen Needle Nano U/F 32G X 4 MM Misc Generic drug: Insulin  Pen Needle Use in the morning, at noon, in the evening, and at bedtime.   chlorhexidine  0.12 % solution Commonly known as: Peridex  Use as directed 15 mLs in the mouth or throat 2 (two) times daily.   citalopram  40 MG tablet Commonly known as: CELEXA  Take 1 tablet (40 mg total) by mouth daily. For anxiety and depression   DSS 100 MG Caps Take by mouth.   FreeStyle Libre 3 Plus Sensor Misc Change sensor every 15 days.   FreeStyle Libre 3 Reader [place] Use to check blood sugar continuously throughout the day.   gabapentin  600 MG tablet Commonly known as: Neurontin  Take 1 tablet (600 mg total) by mouth 3 (three) times daily.   glipiZIDE  10 MG tablet Commonly known as: GLUCOTROL  Take 1 tablet (10 mg total) by mouth 2 (two) times daily before a meal.   hydrOXYzine  25 MG capsule Commonly known as: VISTARIL  Take 1 capsule (25 mg total) by mouth 2 (two) times daily as needed.   insulin  lispro 100 UNIT/ML KwikPen Commonly known as: HumaLOG  KwikPen inject a max of 30 units daily as directed   Jardiance  25 MG Tabs tablet Generic drug: empagliflozin  Take 1 tablet (25 mg total) by mouth daily before breakfast.   lansoprazole  30 MG capsule Commonly known as: PREVACID  Take 1 capsule (30 mg total) by mouth daily at 12 noon. FOR ACID REFLUX   Lantus  SoloStar 100 UNIT/ML Solostar Pen Generic drug: insulin  glargine Inject 40 Units into the skin daily.   linaclotide  72 MCG capsule Commonly known as: Linzess  Take 1 capsule  (72 mcg total) by mouth daily before breakfast. For constipation   lisinopril  5 MG tablet Commonly known as: ZESTRIL  Take 1 tablet (5 mg total) by mouth daily.   Na Sulfate-K Sulfate-Mg Sulfate concentrate 17.5-3.13-1.6 GM/177ML Soln Commonly known as: SUPREP See admin instructions.   NovoLOG  FlexPen 100 UNIT/ML FlexPen Generic drug: insulin  aspart inject a max of 30 unitd daily as directed   nystatin -triamcinolone  ointment Commonly known as: MYCOLOG APPLY THIN FILM TO EXTERNAL VAGINAL AREA DAILY X 5 DAYS. DO NOT INSERT INTO VAGINA   ondansetron  4 MG disintegrating tablet Commonly known as: ZOFRAN -ODT Take 1 tablet (4 mg  total) by mouth every 8 (eight) hours as needed for nausea or vomiting.   OneTouch Verio Reflect w/Device Kit Check blood glucose level by fingerstick twice per day. Dx: E11.65   pantoprazole  40 MG tablet Commonly known as: PROTONIX  Take 1 tablet every day by oral route.   tiZANidine  4 MG tablet Commonly known as: ZANAFLEX  Take 1 tablet (4 mg total) by mouth every 6 (six) hours as needed for muscle spasms.   TRUEplus Lancets 28G Misc Use as instructed. Check blood glucose level by fingerstick twice per day.   E11.65         ALLERGIES: Allergies  Allergen Reactions   Ciprofloxacin    Metformin And Related Diarrhea     REVIEW OF SYSTEMS: A comprehensive ROS was conducted with the patient and is negative except as per HPI    OBJECTIVE:   VITAL SIGNS: BP 138/78   Ht 5' 6 (1.676 m)   Wt 133 lb (60.3 kg)   BMI 21.47 kg/m    PHYSICAL EXAM:  General: Pt appears well and is in NAD  Lungs: Clear with good BS bilat   Heart: RRR   Abdomen:  soft, nontender  Extremities:  Lower extremities - No pretibial edema.   Neuro: MS is good with appropriate affect, pt is alert and Ox3    DM foot exam:11/14/2023 per podiatry    DATA REVIEWED:  Lab Results  Component Value Date   HGBA1C 9.8 (A) 08/11/2023   HGBA1C 12.7 (A) 03/31/2023   HGBA1C  11.7 (A) 11/10/2022     Latest Reference Range & Units 08/11/23 10:25  Microalb, Ur mg/dL 85.3  MICROALB/CREAT RATIO <30 mg/g creat 203 (H)  Creatinine, Urine 20 - 275 mg/dL 72     Latest Reference Range & Units 08/11/23 10:25  Sodium 135 - 146 mmol/L 139  Potassium 3.5 - 5.3 mmol/L 4.2  Chloride 98 - 110 mmol/L 104  CO2 20 - 32 mmol/L 31  Glucose 65 - 99 mg/dL 855 (H)  BUN 7 - 25 mg/dL 14  Creatinine 9.49 - 8.96 mg/dL 9.44  Calcium  8.6 - 10.4 mg/dL 9.5  BUN/Creatinine Ratio 6 - 22 (calc) SEE NOTE:  eGFR > OR = 60 mL/min/1.77m2 110  Total CHOL/HDL Ratio <5.0 (calc) 1.7  Cholesterol <200 mg/dL 891  HDL Cholesterol > OR = 50 mg/dL 64  LDL Cholesterol (Calc) mg/dL (calc) 29  MICROALB/CREAT RATIO <30 mg/g creat 203 (H)  Non-HDL Cholesterol (Calc) <130 mg/dL (calc) 44  Triglycerides <150 mg/dL 73      Latest Reference Range & Units 08/11/23 10:25  Sodium 135 - 146 mmol/L 139  Potassium 3.5 - 5.3 mmol/L 4.2  Chloride 98 - 110 mmol/L 104  CO2 20 - 32 mmol/L 31  Glucose 65 - 99 mg/dL 855 (H)  BUN 7 - 25 mg/dL 14  Creatinine 9.49 - 8.96 mg/dL 9.44  Calcium  8.6 - 10.4 mg/dL 9.5  BUN/Creatinine Ratio 6 - 22 (calc) SEE NOTE:  eGFR > OR = 60 mL/min/1.59m2 110  Total CHOL/HDL Ratio <5.0 (calc) 1.7  Cholesterol <200 mg/dL 891  HDL Cholesterol > OR = 50 mg/dL 64  LDL Cholesterol (Calc) mg/dL (calc) 29  MICROALB/CREAT RATIO <30 mg/g creat 203 (H)  Non-HDL Cholesterol (Calc) <130 mg/dL (calc) 44  Triglycerides <150 mg/dL 73    Latest Reference Range & Units 08/11/23 10:25  Microalb, Ur mg/dL 85.3  MICROALB/CREAT RATIO <30 mg/g creat 203 (H)  Creatinine, Urine 20 - 275 mg/dL 72  ASSESSMENT / PLAN / RECOMMENDATIONS:   1) Type 2 Diabetes Mellitus, Poorly controlled, With Neuropathic and retinopathic  complications - Most recent A1c of 9.2 %. Goal A1c < 7.0 %.     -A1c continues to trend down despite discontinuing NovoLog  which is indication that she has not been taking  NovoLog  as consistent -She is on glipizide , she does have difficulty taking the evening dose, I will change it to extended release that way she will take in the morning only -I will decrease basal insulin , to prevent hypoglycemia - Intolerant to metformin - Intolerant to Trulicity  as well as did not like extreme weight loss - We may consider pioglitazone in the future if no contraindications  MEDICATIONS:  Switch glipizide  10 mg XL, 1 tablet before breakfast Take Jardiance  25 mg daily Decrease Lantus  36 units daily   EDUCATION / INSTRUCTIONS: BG monitoring instructions: Patient is instructed to check her blood sugars 3 times a day. Call [facility] if: BG persistently < 70  I reviewed the Rule of 15 for the treatment of hypoglycemia in detail with the patient. Literature supplied.   2) Diabetic complications:  Eye: Does  have known diabetic retinopathy.  Neuro/ Feet: Does  have known diabetic peripheral neuropathy. Renal: Patient does not have known baseline CKD. She is  on an ACEI/ARB at present.   3) Microalbuminuria :  - Elevated but is trending down - Refill of lisinopril  5 mg daily was sent to the pharmacy on her last visit   4) Dyslipidemia  -At goal - No change  5) Postmenopausal symptoms:   - Patient with tolerable hot flashes and hair loss - She does have family history of breast cancer as well as VTE.  We did discuss that HRT's risk outweighs the benefit at this time - Patient encouraged to try OTC vitamins/shampoos to improve her health     Follow-up in 4 months  Signed electronically by: Stefano Redgie Butts, MD  [facility]  [facility] [address] [place] [place], [place] [address] Phone: [phone] FAX: [phone]   CC: Theotis Haze ORN, NP [address] [place] [address] Phone: [phone]  Fax: [phone]    Return to Endocrinology clinic as below: Future  Appointments  Date Time Provider Department Center  01/19/2024  9:30 AM Theotis Haze ORN, NP [facility]

## 2023-11-17 NOTE — Patient Instructions (Addendum)
 Take Jardiance  25 mg, 1 tablet every morning  Switch glipizide  to 10 mg XL, 1 tablet before breakfast every morning Take Lantus  36 units ONCE daily     HOW TO TREAT LOW BLOOD SUGARS (Blood sugar LESS THAN 70 MG/DL) Please follow the RULE OF 15 for the treatment of hypoglycemia treatment (when your (blood sugars are less than 70 mg/dL)   STEP 1: Take 15 grams of carbohydrates when your blood sugar is low, which includes:  3-4 GLUCOSE TABS  OR 3-4 OZ OF JUICE OR REGULAR SODA OR ONE TUBE OF GLUCOSE GEL    STEP 2: RECHECK blood sugar in 15 MINUTES STEP 3: If your blood sugar is still low at the 15 minute recheck --> then, go back to STEP 1 and treat AGAIN with another 15 grams of carbohydrates.

## 2023-12-14 ENCOUNTER — Other Ambulatory Visit (HOSPITAL_COMMUNITY): Payer: Self-pay

## 2023-12-14 ENCOUNTER — Other Ambulatory Visit: Payer: Self-pay

## 2023-12-15 ENCOUNTER — Other Ambulatory Visit: Payer: Self-pay

## 2024-01-11 ENCOUNTER — Other Ambulatory Visit: Payer: Self-pay | Admitting: Internal Medicine

## 2024-01-11 ENCOUNTER — Other Ambulatory Visit: Payer: Self-pay

## 2024-01-11 MED ORDER — FREESTYLE LIBRE 3 PLUS SENSOR MISC
3 refills | Status: AC
  Filled 2024-01-11: qty 2, 30d supply, fill #0

## 2024-01-15 ENCOUNTER — Other Ambulatory Visit: Payer: Self-pay

## 2024-01-15 ENCOUNTER — Other Ambulatory Visit: Payer: Self-pay | Admitting: Nurse Practitioner

## 2024-01-15 DIAGNOSIS — R110 Nausea: Secondary | ICD-10-CM

## 2024-01-15 MED ORDER — ONDANSETRON 4 MG PO TBDP
4.0000 mg | ORAL_TABLET | Freq: Three times a day (TID) | ORAL | 1 refills | Status: AC | PRN
  Filled 2024-01-15: qty 60, 20d supply, fill #0

## 2024-01-16 ENCOUNTER — Other Ambulatory Visit: Payer: Self-pay

## 2024-01-16 ENCOUNTER — Ambulatory Visit: Admitting: Nurse Practitioner

## 2024-01-17 ENCOUNTER — Telehealth: Payer: Self-pay | Admitting: Nurse Practitioner

## 2024-01-17 NOTE — Telephone Encounter (Signed)
 Contacted pt left vm to confirmed appt (per vr)

## 2024-01-19 ENCOUNTER — Other Ambulatory Visit: Payer: Self-pay

## 2024-01-19 ENCOUNTER — Ambulatory Visit: Attending: Nurse Practitioner | Admitting: Nurse Practitioner

## 2024-01-19 ENCOUNTER — Encounter: Payer: Self-pay | Admitting: Nurse Practitioner

## 2024-01-19 VITALS — BP 125/75 | HR 76 | Ht 66.0 in | Wt 130.4 lb

## 2024-01-19 DIAGNOSIS — K5909 Other constipation: Secondary | ICD-10-CM

## 2024-01-19 DIAGNOSIS — E1142 Type 2 diabetes mellitus with diabetic polyneuropathy: Secondary | ICD-10-CM

## 2024-01-19 DIAGNOSIS — Z23 Encounter for immunization: Secondary | ICD-10-CM

## 2024-01-19 DIAGNOSIS — R5382 Chronic fatigue, unspecified: Secondary | ICD-10-CM

## 2024-01-19 DIAGNOSIS — F419 Anxiety disorder, unspecified: Secondary | ICD-10-CM

## 2024-01-19 DIAGNOSIS — G8929 Other chronic pain: Secondary | ICD-10-CM

## 2024-01-19 DIAGNOSIS — Z1211 Encounter for screening for malignant neoplasm of colon: Secondary | ICD-10-CM

## 2024-01-19 DIAGNOSIS — K219 Gastro-esophageal reflux disease without esophagitis: Secondary | ICD-10-CM

## 2024-01-19 MED ORDER — LINACLOTIDE 72 MCG PO CAPS
72.0000 ug | ORAL_CAPSULE | Freq: Every day | ORAL | 2 refills | Status: AC
  Filled 2024-01-19: qty 90, 90d supply, fill #0

## 2024-01-19 MED ORDER — PANTOPRAZOLE SODIUM 40 MG PO TBEC
40.0000 mg | DELAYED_RELEASE_TABLET | Freq: Every day | ORAL | 1 refills | Status: AC
  Filled 2024-01-19: qty 30, 30d supply, fill #0

## 2024-01-19 MED ORDER — ATORVASTATIN CALCIUM 20 MG PO TABS
20.0000 mg | ORAL_TABLET | Freq: Every day | ORAL | 1 refills | Status: AC
  Filled 2024-01-19: qty 90, 90d supply, fill #0

## 2024-01-19 MED ORDER — TIZANIDINE HCL 4 MG PO TABS
4.0000 mg | ORAL_TABLET | Freq: Four times a day (QID) | ORAL | 3 refills | Status: AC | PRN
  Filled 2024-01-19: qty 90, 23d supply, fill #0

## 2024-01-19 NOTE — Progress Notes (Unsigned)
 "  Assessment & Plan:  Denise Hale was seen today for hypertension, headache and fatigue.  Diagnoses and all orders for this visit:  Colon cancer screening  Need for Tdap vaccination  Need for pneumococcal 20-valent conjugate vaccination    Patient has been counseled on age-appropriate routine health concerns for screening and prevention. These are reviewed and up-to-date. Referrals have been placed accordingly. Immunizations are up-to-date or declined.    Subjective:   Chief Complaint  Patient presents with   Hypertension   Headache   Fatigue    Denise Hale 54 y.o. female presents to office today     ROS  Past Medical History:  Diagnosis Date   Anxiety    Asthma    Diverticulitis    DM (diabetes mellitus), type 2 with ophthalmic complications (HCC)    Essential hypertension 11/17/2023   GERD (gastroesophageal reflux disease)    Mixed hyperlipidemia 11/17/2023   Severe nonproliferative diabetic retinopathy (HCC)     Past Surgical History:  Procedure Laterality Date   CARPAL TUNNEL RELEASE Right 04/08/2020   Procedure: RIGHT CARPAL TUNNEL RELEASE;  Surgeon: Jerri Kay HERO, MD;  Location: [facility];  Service: Orthopedics;  Laterality: Right;   CHOLECYSTECTOMY     COLON SURGERY      Family History  Problem Relation Age of Onset   Diabetes Mother    Thyroid disease Mother    Heart disease Father    Breast cancer Paternal Aunt    Breast cancer Paternal Aunt     Social History Reviewed with no changes to be made today.   Outpatient Medications Prior to Visit  Medication Sig Dispense Refill   atorvastatin  (LIPITOR) 20 MG tablet Take 1 tablet (20 mg total) by mouth daily. Must have office visit for refills 90 tablet 1   Blood Glucose Monitoring Suppl (ONETOUCH VERIO REFLECT) w/Device KIT Check blood glucose level by fingerstick twice per day. Dx: E11.65 1 kit 0   citalopram  (CELEXA ) 40 MG tablet Take 1 tablet (40 mg total) by mouth daily. For anxiety  and depression 30 tablet 3   Continuous Glucose Receiver (FREESTYLE LIBRE 3 READER) DEVI Use to check blood sugar continuously throughout the day. 1 each 0   Continuous Glucose Sensor (FREESTYLE LIBRE 3 PLUS SENSOR) MISC Change sensor every 15 days. 6 each 3   Docusate Sodium (DSS) 100 MG CAPS Take by mouth.     empagliflozin  (JARDIANCE ) 25 MG TABS tablet Take 1 tablet (25 mg total) by mouth daily before breakfast. 90 tablet 3   gabapentin  (NEURONTIN ) 600 MG tablet Take 1 tablet (600 mg total) by mouth 3 (three) times daily. 90 tablet 3   glipiZIDE  (GLUCOTROL  XL) 10 MG 24 hr tablet Take 1 tablet (10 mg total) by mouth daily with breakfast. 90 tablet 3   insulin  glargine (LANTUS  SOLOSTAR) 100 UNIT/ML Solostar Pen Inject 36 Units into the skin daily. 45 mL 3   Insulin  Pen Needle 32G X 4 MM MISC Use daily. 100 each 3   lansoprazole  (PREVACID ) 30 MG capsule Take 1 capsule (30 mg total) by mouth daily at 12 noon. FOR ACID REFLUX 90 capsule 0   lisinopril  (ZESTRIL ) 5 MG tablet Take 1 tablet (5 mg total) by mouth daily. 90 tablet 3   nystatin -triamcinolone  ointment (MYCOLOG) APPLY THIN FILM TO EXTERNAL VAGINAL AREA DAILY X 5 DAYS. DO NOT INSERT INTO VAGINA 30 g 0   ondansetron  (ZOFRAN -ODT) 4 MG disintegrating tablet Take 1 tablet (4 mg total) by mouth every  8 (eight) hours as needed for nausea or vomiting. 60 tablet 1   TRUEplus Lancets 28G MISC Use as instructed. Check blood glucose level by fingerstick twice per day.   E11.65 100 each 3   chlorhexidine  (PERIDEX ) 0.12 % solution Use as directed 15 mLs in the mouth or throat 2 (two) times daily. (Patient not taking: Reported on 01/19/2024) 473 mL 1   hydrOXYzine  (VISTARIL ) 25 MG capsule Take 1 capsule (25 mg total) by mouth 2 (two) times daily as needed. (Patient not taking: Reported on 01/19/2024) 30 capsule 1   insulin  aspart (NOVOLOG  FLEXPEN) 100 UNIT/ML FlexPen inject a max of 30 unitd daily as directed (Patient not taking: Reported on 01/19/2024) 30 mL 2    linaclotide  (LINZESS ) 72 MCG capsule Take 1 capsule (72 mcg total) by mouth daily before breakfast. For constipation (Patient not taking: Reported on 01/19/2024) 90 capsule 2   Na Sulfate-K Sulfate-Mg Sulf 17.5-3.13-1.6 GM/177ML SOLN See admin instructions. (Patient not taking: Reported on 01/19/2024)     pantoprazole  (PROTONIX ) 40 MG tablet Take 1 tablet every day by oral route. (Patient not taking: Reported on 01/19/2024)     tiZANidine  (ZANAFLEX ) 4 MG tablet Take 1 tablet (4 mg total) by mouth every 6 (six) hours as needed for muscle spasms. (Patient not taking: Reported on 01/19/2024) 90 tablet 3   No facility-administered medications prior to visit.    Allergies[1]     Objective:    BP 125/75 (BP Location: Left Arm, Patient Position: Sitting, Cuff Size: Normal)   Pulse 76   Ht 5' 6 (1.676 m)   Wt 130 lb 6.4 oz (59.1 kg)   SpO2 100%   BMI 21.05 kg/m  Wt Readings from Last 3 Encounters:  01/19/24 130 lb 6.4 oz (59.1 kg)  11/17/23 133 lb (60.3 kg)  10/16/23 139 lb 9.6 oz (63.3 kg)    Physical Exam       Patient has been counseled extensively about nutrition and exercise as well as the importance of adherence with medications and regular follow-up. The patient was given clear instructions to go to ER or return to medical center if symptoms don't improve, worsen or new problems develop. The patient verbalized understanding.   Follow-up: No follow-ups on file.   Haze LELON Servant, FNP-BC [facility] [place], [place] [phone]   01/19/2024, 9:57 AM    [1]  Allergies Allergen Reactions   Ciprofloxacin    Metformin And Related Diarrhea   "

## 2024-01-20 ENCOUNTER — Ambulatory Visit: Payer: Self-pay | Admitting: Nurse Practitioner

## 2024-01-21 LAB — CBC WITH DIFFERENTIAL/PLATELET
Basophils Absolute: 0 x10E3/uL (ref 0.0–0.2)
Basos: 0 %
EOS (ABSOLUTE): 0.3 x10E3/uL (ref 0.0–0.4)
Eos: 3 %
Hematocrit: 43.5 % (ref 34.0–46.6)
Hemoglobin: 14.2 g/dL (ref 11.1–15.9)
Immature Grans (Abs): 0 x10E3/uL (ref 0.0–0.1)
Immature Granulocytes: 0 %
Lymphocytes Absolute: 2.9 x10E3/uL (ref 0.7–3.1)
Lymphs: 36 %
MCH: 30.3 pg (ref 26.6–33.0)
MCHC: 32.6 g/dL (ref 31.5–35.7)
MCV: 93 fL (ref 79–97)
Monocytes Absolute: 0.6 x10E3/uL (ref 0.1–0.9)
Monocytes: 7 %
Neutrophils Absolute: 4.4 x10E3/uL (ref 1.4–7.0)
Neutrophils: 54 %
Platelets: 344 x10E3/uL (ref 150–450)
RBC: 4.69 x10E6/uL (ref 3.77–5.28)
RDW: 12.9 % (ref 11.7–15.4)
WBC: 8.2 x10E3/uL (ref 3.4–10.8)

## 2024-01-21 LAB — CMP14+EGFR
ALT: 50 IU/L — ABNORMAL HIGH (ref 0–32)
AST: 26 IU/L (ref 0–40)
Albumin: 4.1 g/dL (ref 3.8–4.9)
Alkaline Phosphatase: 132 IU/L (ref 49–135)
BUN/Creatinine Ratio: 17 (ref 9–23)
BUN: 11 mg/dL (ref 6–24)
Bilirubin Total: <0.2 mg/dL (ref 0.0–1.2)
CO2: 23 mmol/L (ref 20–29)
Calcium: 9.7 mg/dL (ref 8.7–10.2)
Chloride: 101 mmol/L (ref 96–106)
Creatinine, Ser: 0.63 mg/dL (ref 0.57–1.00)
Globulin, Total: 3.3 g/dL (ref 1.5–4.5)
Glucose: 128 mg/dL — ABNORMAL HIGH (ref 70–99)
Potassium: 4.9 mmol/L (ref 3.5–5.2)
Sodium: 139 mmol/L (ref 134–144)
Total Protein: 7.4 g/dL (ref 6.0–8.5)
eGFR: 106 mL/min/1.73

## 2024-01-21 LAB — IRON,TIBC AND FERRITIN PANEL
Ferritin: 73 ng/mL (ref 15–150)
Iron Saturation: 12 % — ABNORMAL LOW (ref 15–55)
Iron: 43 ug/dL (ref 27–159)
Total Iron Binding Capacity: 352 ug/dL (ref 250–450)
UIBC: 309 ug/dL (ref 131–425)

## 2024-01-22 ENCOUNTER — Other Ambulatory Visit: Payer: Self-pay

## 2024-01-23 ENCOUNTER — Other Ambulatory Visit: Payer: Self-pay

## 2024-01-25 ENCOUNTER — Telehealth: Payer: Self-pay

## 2024-01-25 ENCOUNTER — Other Ambulatory Visit: Payer: Self-pay

## 2024-01-25 NOTE — Telephone Encounter (Signed)
 Pharmacy Patient Advocate Encounter   Received notification from CoverMyMeds that prior authorization for LINZESS  is required/requested.   Insurance verification completed.   The patient is insured through [employer].   Per test claim: PA required; PA submitted to above mentioned insurance via CoverMyMeds Key/confirmation #/EOC [id number] Status is pending

## 2024-02-01 ENCOUNTER — Other Ambulatory Visit: Payer: Self-pay

## 2024-02-01 NOTE — Telephone Encounter (Signed)
 Pharmacy Patient Advocate Encounter  Received notification from [facility] that Prior Authorization for LINZESS  has been DENIED.  Full denial letter will be uploaded to the media tab. See denial reason below.   PA #/Case ID/Reference #: [id number]  TRIAL OF PREFERRED DRUGS REQUIRED: POLYETHYLENE GLYCOL OR LACTULOSE

## 2024-02-05 ENCOUNTER — Ambulatory Visit (HOSPITAL_COMMUNITY): Payer: Self-pay | Admitting: Psychiatry

## 2024-02-05 ENCOUNTER — Encounter (HOSPITAL_COMMUNITY): Payer: Self-pay

## 2024-03-18 ENCOUNTER — Ambulatory Visit: Admitting: Internal Medicine

## 2024-03-26 ENCOUNTER — Ambulatory Visit (HOSPITAL_COMMUNITY): Payer: Self-pay

## 2024-05-20 ENCOUNTER — Ambulatory Visit: Payer: Self-pay | Admitting: Nurse Practitioner
